# Patient Record
Sex: Male | Born: 1950
Health system: Southern US, Community
[De-identification: ages and names within clinical notes are randomized; demographics above are authoritative.]

## PROBLEM LIST (undated history)

## (undated) DIAGNOSIS — F329 Major depressive disorder, single episode, unspecified: Secondary | ICD-10-CM

## (undated) DIAGNOSIS — T8859XA Other complications of anesthesia, initial encounter: Secondary | ICD-10-CM

## (undated) DIAGNOSIS — J309 Allergic rhinitis, unspecified: Secondary | ICD-10-CM

## (undated) DIAGNOSIS — E785 Hyperlipidemia, unspecified: Secondary | ICD-10-CM

## (undated) DIAGNOSIS — K635 Polyp of colon: Secondary | ICD-10-CM

## (undated) DIAGNOSIS — F32A Depression, unspecified: Secondary | ICD-10-CM

## (undated) DIAGNOSIS — M1990 Unspecified osteoarthritis, unspecified site: Secondary | ICD-10-CM

## (undated) DIAGNOSIS — R569 Unspecified convulsions: Secondary | ICD-10-CM

## (undated) DIAGNOSIS — G5600 Carpal tunnel syndrome, unspecified upper limb: Secondary | ICD-10-CM

## (undated) DIAGNOSIS — F419 Anxiety disorder, unspecified: Secondary | ICD-10-CM

## (undated) DIAGNOSIS — I10 Essential (primary) hypertension: Secondary | ICD-10-CM

## (undated) DIAGNOSIS — K219 Gastro-esophageal reflux disease without esophagitis: Secondary | ICD-10-CM

## (undated) DIAGNOSIS — G4730 Sleep apnea, unspecified: Secondary | ICD-10-CM

## (undated) DIAGNOSIS — G4700 Insomnia, unspecified: Secondary | ICD-10-CM

## (undated) DIAGNOSIS — T7840XA Allergy, unspecified, initial encounter: Secondary | ICD-10-CM

## (undated) DIAGNOSIS — N529 Male erectile dysfunction, unspecified: Secondary | ICD-10-CM

## (undated) DIAGNOSIS — T4145XA Adverse effect of unspecified anesthetic, initial encounter: Secondary | ICD-10-CM

## (undated) HISTORY — DX: Essential (primary) hypertension: I10

## (undated) HISTORY — DX: Allergy, unspecified, initial encounter: T78.40XA

## (undated) HISTORY — PX: COLONOSCOPY: SHX174

## (undated) HISTORY — DX: Polyp of colon: K63.5

## (undated) HISTORY — DX: Hyperlipidemia, unspecified: E78.5

## (undated) HISTORY — DX: Carpal tunnel syndrome, unspecified upper limb: G56.00

## (undated) HISTORY — DX: Insomnia, unspecified: G47.00

## (undated) HISTORY — DX: Anxiety disorder, unspecified: F41.9

## (undated) HISTORY — DX: Allergic rhinitis, unspecified: J30.9

## (undated) HISTORY — DX: Depression, unspecified: F32.A

## (undated) HISTORY — DX: Sleep apnea, unspecified: G47.30

## (undated) HISTORY — DX: Gastro-esophageal reflux disease without esophagitis: K21.9

## (undated) HISTORY — PX: UPPER GASTROINTESTINAL ENDOSCOPY: SHX188

## (undated) HISTORY — DX: Major depressive disorder, single episode, unspecified: F32.9

## (undated) HISTORY — PX: TONSILLECTOMY: SUR1361

## (undated) HISTORY — DX: Unspecified osteoarthritis, unspecified site: M19.90

## (undated) HISTORY — DX: Unspecified convulsions: R56.9

## (undated) HISTORY — DX: Male erectile dysfunction, unspecified: N52.9

## (undated) HISTORY — PX: WISDOM TOOTH EXTRACTION: SHX21

---

## 2011-09-28 ENCOUNTER — Other Ambulatory Visit: Payer: Self-pay | Admitting: Internal Medicine

## 2011-09-28 DIAGNOSIS — N644 Mastodynia: Secondary | ICD-10-CM

## 2011-09-28 DIAGNOSIS — N63 Unspecified lump in unspecified breast: Secondary | ICD-10-CM

## 2011-10-04 ENCOUNTER — Ambulatory Visit
Admission: RE | Admit: 2011-10-04 | Discharge: 2011-10-04 | Disposition: A | Discharge status: Home / Self Care | Payer: Self-pay | Source: Ambulatory Visit | Attending: Internal Medicine | Admitting: Internal Medicine

## 2011-10-04 DIAGNOSIS — N63 Unspecified lump in unspecified breast: Secondary | ICD-10-CM

## 2011-10-04 DIAGNOSIS — N644 Mastodynia: Secondary | ICD-10-CM

## 2011-10-15 ENCOUNTER — Other Ambulatory Visit: Payer: Self-pay

## 2011-10-15 ENCOUNTER — Emergency Department (HOSPITAL_COMMUNITY)
Admission: EM | Admit: 2011-10-15 | Discharge: 2011-10-16 | Disposition: A | Discharge status: Home / Self Care | Payer: PRIVATE HEALTH INSURANCE | Attending: Emergency Medicine | Admitting: Emergency Medicine

## 2011-10-15 ENCOUNTER — Encounter (HOSPITAL_COMMUNITY): Payer: Self-pay | Admitting: Emergency Medicine

## 2011-10-15 DIAGNOSIS — R569 Unspecified convulsions: Secondary | ICD-10-CM | POA: Insufficient documentation

## 2011-10-15 DIAGNOSIS — R110 Nausea: Secondary | ICD-10-CM | POA: Insufficient documentation

## 2011-10-15 DIAGNOSIS — R4182 Altered mental status, unspecified: Secondary | ICD-10-CM | POA: Insufficient documentation

## 2011-10-15 DIAGNOSIS — R42 Dizziness and giddiness: Secondary | ICD-10-CM | POA: Insufficient documentation

## 2011-10-15 LAB — URINALYSIS, ROUTINE W REFLEX MICROSCOPIC
Bilirubin Urine: NEGATIVE
Glucose, UA: NEGATIVE mg/dL
Ketones, ur: NEGATIVE mg/dL
Leukocytes, UA: NEGATIVE
pH: 6 (ref 5.0–8.0)

## 2011-10-15 NOTE — ED Notes (Signed)
Pt states on March 1st, he had an episode where he becomes disoriented for a few seconds. Pt states he doesn't lose consciousness.  Pt states it happened a few times at the beginning of March so he called his PCP.  The episodes stopped for a few weeks but for the past 5 days he has had 1 episode a day.  Pt states he had 2 episodes today this am (last one at 1100), called his PCP and was told to come here.  Pt denies headache, chest pain, vision changes, syncope, or any other c/o now or during the episodes.

## 2011-10-16 ENCOUNTER — Emergency Department (HOSPITAL_COMMUNITY): Payer: PRIVATE HEALTH INSURANCE

## 2011-10-16 LAB — DIFFERENTIAL
Basophils Relative: 0 % (ref 0–1)
Lymphs Abs: 2.8 10*3/uL (ref 0.7–4.0)
Monocytes Absolute: 1.3 10*3/uL — ABNORMAL HIGH (ref 0.1–1.0)
Monocytes Relative: 14 % — ABNORMAL HIGH (ref 3–12)
Neutro Abs: 4.7 10*3/uL (ref 1.7–7.7)

## 2011-10-16 LAB — BASIC METABOLIC PANEL
BUN: 14 mg/dL (ref 6–23)
Chloride: 105 mEq/L (ref 96–112)
Creatinine, Ser: 1.13 mg/dL (ref 0.50–1.35)
GFR calc Af Amer: 79 mL/min — ABNORMAL LOW (ref >90)
Glucose, Bld: 101 mg/dL — ABNORMAL HIGH (ref 70–99)

## 2011-10-16 LAB — CBC
HCT: 42.9 % (ref 39.0–52.0)
Hemoglobin: 14.6 g/dL (ref 13.0–17.0)
MCH: 29.9 pg (ref 26.0–34.0)
MCHC: 34 g/dL (ref 30.0–36.0)

## 2011-10-16 NOTE — Discharge Instructions (Signed)
Nonepileptic Seizures  Nonepileptic seizures look like true epileptic seizures. The difference between nonepileptic seizures and real seizures is that real seizures are caused by an electrical abnormality in the brain. Nonepileptic seizures have no medical cause. Nonepileptic seizures may look real to an untrained person. A neurologist can usually tell the difference between a real seizure and a nonepileptic seizure. Nonepileptic seizures may also be called pseudoseizures. They are more frequent in women.  CAUSES   In general, the patient is unaware that the movements are not real seizures. This disorder is caused by stress or emotional trauma. Patients often feel badly. Patients are sometimes accused of causing the seizure-like movements when they are not aware that their symptoms are due to stress. The nonepileptic seizures are real and frightening to patients with this disorder. Sometimes, nonepileptic seizures may be due to a person faking the symptoms to get something he or she wants.  DIAGNOSIS   The diagnosis requires the patient to be continuously monitored by:   EEG (electroencephalogram).   Video camera.  After an episode, the patient is asked about their awareness, memory, and feelings during the seizure. The family, if present, also discusses what they see. The EEG and clinical information allows the neurologist to determine if the seizures are related to abnormal electrical activity in the brain.  TREATMENT   Medicines may be stopped if the patient has been treated for a true seizure disorder. Patient counseling is usually begun. Depression and anxiety, if present, are treated. Counseling helps to resolve stress.   Document Released: 08/13/2005 Document Revised: 06/17/2011 Document Reviewed: 01/09/2009  Exit[employer] Patient Information 2012 [employer].

## 2011-10-16 NOTE — ED Provider Notes (Addendum)
History     CSN: [id number]  Arrival date & time 10/15/11  1952   First MD Initiated Contact with Patient 10/15/11 2340      Chief Complaint  Patient presents with  . Altered Mental Status    (Consider location/radiation/quality/duration/timing/severity/associated sxs/prior treatment) HPI Comments: Patient has been having intermittent episodes for the last one month. He gives an intense smell and I thought that he can never remember. The episodes last for 20-30 seconds and then resolved. During these episodes he is conscious and alert. He denies any decreased vision, tongue biting, incontinence, weakness, loss of consciousness. They have become more frequent initially he was having them once a week and then they started happening every few days and for the last 5 days he's had one daily until today he had 2 episodes.  Patient is a 61 y.o. male presenting with altered mental status. The history is provided by the patient.  Altered Mental Status This is a recurrent problem. Episode onset: 1 month ago. The problem occurs daily. The problem has been gradually worsening. Pertinent negatives include no chest pain, no abdominal pain, no headaches and no shortness of breath. The symptoms are aggravated by nothing. The symptoms are relieved by nothing. He has tried nothing for the symptoms. The treatment provided no relief.    History reviewed. No pertinent past medical history.  History reviewed. No pertinent past surgical history.  History reviewed. No pertinent family history.  History  Substance Use Topics  . Smoking status: Never Smoker   . Smokeless tobacco: Not on file  . Alcohol Use: Yes     occasionally      Review of Systems  Constitutional: Negative for fever, appetite change and fatigue.  HENT: Negative for ear pain, congestion and rhinorrhea.   Respiratory: Negative for shortness of breath.   Cardiovascular: Negative for chest pain.  Gastrointestinal: Positive for  nausea. Negative for vomiting, abdominal pain and diarrhea.  Genitourinary: Negative for dysuria.  Neurological: Positive for dizziness. Negative for syncope, weakness, light-headedness and headaches.  Psychiatric/Behavioral: Positive for altered mental status.  All other systems reviewed and are negative.    Allergies  Review of patient's allergies indicates no known allergies.  Home Medications   Current Outpatient Rx  Name Route Sig Dispense Refill  . VITAMIN D 1000 UNITS PO TABS Oral Take 1,000 Units by mouth daily.    Marland Kitchen ESOMEPRAZOLE MAGNESIUM 40 MG PO CPDR Oral Take 40 mg by mouth daily before breakfast.    . OMEGA-3 FATTY ACIDS 1000 MG PO CAPS Oral Take 2 g by mouth daily.    Marland Kitchen METOPROLOL SUCCINATE ER 50 MG PO TB24 Oral Take 50 mg by mouth daily. Take with or immediately following a meal.    . ADULT MULTIVITAMIN W/MINERALS CH Oral Take 1 tablet by mouth daily.    Marland Kitchen OMEPRAZOLE 20 MG PO CPDR Oral Take 20 mg by mouth daily.      BP 128/68  Pulse 60  Temp(Src) 98.5 F (36.9 C) (Oral)  Resp 18  Wt 200 lb (90.719 kg)  SpO2 95%  Physical Exam  Nursing note and vitals reviewed. Constitutional: He is oriented to person, place, and time. He appears well-developed and well-nourished. No distress.  HENT:  Head: Normocephalic and atraumatic.  Mouth/Throat: Oropharynx is clear and moist.  Eyes: Conjunctivae and EOM are normal. Pupils are equal, round, and reactive to light.  Neck: Normal range of motion. Neck supple.  Cardiovascular: Normal rate, regular rhythm and intact distal pulses.  No murmur heard. Pulmonary/Chest: Effort normal and breath sounds normal. No respiratory distress. He has no wheezes. He has no rales.  Abdominal: Soft. He exhibits no distension. There is no tenderness. There is no rebound and no guarding.  Musculoskeletal: Normal range of motion. He exhibits no edema and no tenderness.  Neurological: He is alert and oriented to person, place, and time. He has  normal strength. No cranial nerve deficit or sensory deficit. He displays a negative Romberg sign. Coordination and gait normal. GCS eye subscore is 4. GCS verbal subscore is 5. GCS motor subscore is 6.  Skin: Skin is warm and dry. No rash noted. No erythema.  Psychiatric: He has a normal mood and affect. His behavior is normal.    ED Course  Procedures (including critical care time)  Labs Reviewed  DIFFERENTIAL - Abnormal; Notable for the following:    Monocytes Relative 14 (*)    Monocytes Absolute 1.3 (*)    All other components within normal limits  BASIC METABOLIC PANEL - Abnormal; Notable for the following:    Glucose, Bld 101 (*)    GFR calc non Af Amer 68 (*)    GFR calc Af Amer 79 (*)    All other components within normal limits  URINALYSIS, ROUTINE W REFLEX MICROSCOPIC  CBC   Ct Head Wo Contrast  10/16/2011  *RADIOLOGY REPORT*  Clinical Data: Altered mental status  CT HEAD WITHOUT CONTRAST  Technique:  Contiguous axial images were obtained from the base of the skull through the vertex without contrast.  Comparison: None.  Findings: No evidence of parenchymal hemorrhage or extra-axial fluid collection. No mass lesion, mass effect, or midline shift.  No CT evidence of acute infarction.  Cerebral volume is age appropriate.  No ventriculomegaly.  The visualized paranasal sinuses are essentially clear. The mastoid air cells are unopacified.  No evidence of calvarial fracture.  IMPRESSION: No evidence of acute intracranial abnormality.  Original Report Authenticated By: Charline Bills, M.D.    Date: 10/16/2011  Rate: 60  Rhythm: normal sinus rhythm  QRS Axis: normal  Intervals: normal  ST/T Wave abnormalities: normal  Conduction Disutrbances:none  Narrative Interpretation:   Old EKG Reviewed: none available    1. Seizure       MDM   Patient with episodes concerning for seizure. They have been ongoing now for over a month. Initially he was getting them sporadically and  now he's getting them daily. Today he had 2 episodes. They normally last no longer than 20-30 seconds. He states that he gets a very strong smell and I thought that he can never remember. After this occurred he feels mildly lightheaded and nauseated. He has no weakness, blurred vision, incontinence, tongue biting, loss of consciousness, convulsions. He otherwise states he's been in his normal state of health without any medication changes. Concern for new onset seizures. Patient does have a family history of his sister who has epilepsy. None of his symptoms are concerning for TIA or stroke. His neuro exam is normal. CBC, BMP, head CT, EKG are all within normal limits. Will speak with Dr. Felipa Eth and if patient followup to [facility] neurology.        Gwyneth Sprout, MD 10/16/11 5621  Gwyneth Sprout, MD 10/16/11 3086

## 2011-11-24 ENCOUNTER — Ambulatory Visit
Admission: RE | Admit: 2011-11-24 | Discharge: 2011-11-24 | Disposition: A | Discharge status: Home / Self Care | Payer: PRIVATE HEALTH INSURANCE | Source: Ambulatory Visit | Attending: Neurology | Admitting: Neurology

## 2011-11-24 ENCOUNTER — Other Ambulatory Visit: Payer: Self-pay | Admitting: Neurology

## 2011-11-24 DIAGNOSIS — IMO0002 Reserved for concepts with insufficient information to code with codable children: Secondary | ICD-10-CM

## 2012-04-14 ENCOUNTER — Other Ambulatory Visit (HOSPITAL_COMMUNITY): Payer: Self-pay | Admitting: Internal Medicine

## 2012-04-14 DIAGNOSIS — R1011 Right upper quadrant pain: Secondary | ICD-10-CM

## 2012-04-20 ENCOUNTER — Encounter (HOSPITAL_COMMUNITY)
Admission: RE | Admit: 2012-04-20 | Discharge: 2012-04-20 | Disposition: A | Discharge status: Home / Self Care | Payer: BC Managed Care – PPO | Source: Ambulatory Visit | Attending: Internal Medicine | Admitting: Internal Medicine

## 2012-04-20 DIAGNOSIS — R1011 Right upper quadrant pain: Secondary | ICD-10-CM

## 2012-04-20 MED ORDER — TECHNETIUM TC 99M MEBROFENIN IV KIT
5.4000 | PACK | Freq: Once | INTRAVENOUS | Status: AC | PRN
  Administered 2012-04-20: 5.4 via INTRAVENOUS

## 2012-10-18 ENCOUNTER — Other Ambulatory Visit: Payer: Self-pay | Admitting: Neurology

## 2013-02-17 ENCOUNTER — Other Ambulatory Visit: Payer: Self-pay | Admitting: Neurology

## 2013-03-17 ENCOUNTER — Other Ambulatory Visit: Payer: Self-pay | Admitting: Neurology

## 2013-04-03 ENCOUNTER — Encounter: Payer: Self-pay | Admitting: Nurse Practitioner

## 2013-04-03 ENCOUNTER — Other Ambulatory Visit: Payer: Self-pay | Admitting: Neurology

## 2013-04-03 ENCOUNTER — Ambulatory Visit (INDEPENDENT_AMBULATORY_CARE_PROVIDER_SITE_OTHER): Payer: BC Managed Care – PPO | Admitting: Nurse Practitioner

## 2013-04-03 VITALS — BP 124/72 | HR 72 | Ht 72.0 in | Wt 262.0 lb

## 2013-04-03 DIAGNOSIS — Z79899 Other long term (current) drug therapy: Secondary | ICD-10-CM

## 2013-04-03 DIAGNOSIS — G40109 Localization-related (focal) (partial) symptomatic epilepsy and epileptic syndromes with simple partial seizures, not intractable, without status epilepticus: Secondary | ICD-10-CM

## 2013-04-03 NOTE — Progress Notes (Signed)
[facility]  PATIENT: Samuel Jacobs DOB: 08-29-1950   REASON FOR VISIT: Followup for seizure disorder    HISTORY OF PRESENT ILLNESS: Samuel  Jacobs is a 62 years old right-handed Caucasian male, unaccompanied for visit, followup for seizure disorder. He has past medical history of mild hypertension.  His spells can be different, sometimes he smelled strong orders, lasting for couple minutes, no loss of consciousness, the other times is intrusion of strong emotional thoughts, sometimes pictures, but oftentimes  he has no recollection. He also has a history of staring , eye blinking, face turned white, confused afterwards, lasting for 2 minutes.  He presented to [facility] emergency, CAT scan of brain without contrast was normal. 12/24/2011:MRI of the brain with and without contrast, normal an EEG was normal, he had 2 recurrent episodes stereotypical, smell of ordor, followed by emotional intruding thoughts, mild confusion, lasting for a few minutes, no total loss of consciousness, no tonic-clonic activity, he also complains of insomnia Pt was switched to Depakote in June 2013 and  Keppra was tapered. Pt says he is doing much better on this drug. No further spells.   04/03/13: Patient returns for followup. He had elevated liver functions after his last visit with [place] July last year. His records indicate that he was to have an ultrasound of the gallbladder and liver however I do not have those results and  the patient is not sure if he had those tests. He remains on Depakote 1500 mg total dose daily. He says over the last year  he has had several episodes of intense strong odors not progressing to overt seizure activity.  REVIEW OF SYSTEMS: Full 14 system review of systems performed and notable only for:  Constitutional: Fatigue Cardiovascular: N/A  Ear/Nose/Throat: Ringing in the ears  Skin: N/A  Eyes: N/A  Respiratory: N/A  Gastroitestinal: N/A  Hematology/Lymphatic: N/A    Endocrine: N/A Musculoskeletal: Joint swelling, aching muscles Allergy/Immunology: N/A  Neurological:  Psychiatric: Depression anxiety  ALLERGIES: No Known Allergies  HOME MEDICATIONS: Outpatient Prescriptions Prior to Visit  Medication Sig Dispense Refill  . divalproex (DEPAKOTE ER) 500 MG 24 hr tablet TAKE 1 TABLET BY MOUTH IN THE MORNING THEN TAKE 2 TABLETS BY MOUTH AT BEDTIME  30 tablet  0  . esomeprazole (NEXIUM) 40 MG capsule Take 40 mg by mouth daily before breakfast.      . cholecalciferol (VITAMIN D) 1000 UNITS tablet Take 1,000 Units by mouth daily.      . fish oil-omega-3 fatty acids 1000 MG capsule Take 2 g by mouth daily.      . metoprolol succinate (TOPROL-XL) 50 MG 24 hr tablet Take 50 mg by mouth daily. Take with or immediately following a meal.      . Multiple Vitamin (MULITIVITAMIN WITH MINERALS) TABS Take 1 tablet by mouth daily.      Marland Kitchen omeprazole (PRILOSEC) 20 MG capsule Take 20 mg by mouth daily.       No facility-administered medications prior to visit.    PAST MEDICAL HISTORY: Past Medical History  Diagnosis Date  . Seizure   . Hypertension     PAST SURGICAL HISTORY: Past Surgical History  Procedure Laterality Date  . None      FAMILY HISTORY: History reviewed. No pertinent family history.  SOCIAL HISTORY: History   Social History  . Marital Status: Married    Spouse Name: Jeanie Sewer    Number of Children: 3  . Years of Education: college  Occupational History  . Not on file.   Social History Main Topics  . Smoking status: Never Smoker   . Smokeless tobacco: Never Used  . Alcohol Use: Yes     Comment: occasionally  . Drug Use: No  . Sexual Activity: Not on file   Other Topics Concern  . Not on file   Social History Narrative   Patient lives at home with wife Gavin Pound.    Patient works at [employer].    Patient has 3 children.      PHYSICAL EXAM  Filed Vitals:   04/03/13 1034  BP: 124/72  Pulse: 72  Height: 6' (1.829 m)   Weight: 262 lb (118.842 kg)   Body mass index is 35.53 kg/(m^2).  Generalized: Well developed, obese male in no acute distress  Head: normocephalic and atraumatic,. Oropharynx benign  Neck: Supple, no carotid bruits  Cardiac: Regular rate rhythm, no murmur   Neurological examination   Mentation: Alert oriented to time, place, history taking. Follows all commands speech and language fluent  Cranial nerve II-XII: .Pupils were equal round reactive to light extraocular movements were full, visual field were full on confrontational test. Facial sensation and strength were normal. hearing was intact to finger rubbing bilaterally. Uvula tongue midline. head turning and shoulder shrug and were normal and symmetric.Tongue protrusion into cheek strength was normal. Motor: normal bulk and tone, full strength in the BUE, BLE, fine finger movements normal, no pronator drift. No focal weakness Coordination: finger-nose-finger, heel-to-shin bilaterally, no dysmetria Reflexes: Brachioradialis 2/2, biceps 2/2, triceps 2/2, patellar 2/2, Achilles 2/2, plantar responses were flexor bilaterally. Gait and Station: Rising up from seated position without assistance, normal stance,  moderate stride, good arm swing, smooth turning, able to perform tiptoe, and heel walking without difficulty. Tandem gait steady  DIAGNOSTIC DATA (LABS, IMAGING, TESTING) -None to review    ASSESSMENT AND PLAN  62 y.o. year old male  has a past medical history of Seizure and Hypertension. here for followup. Patient to continue Depakote at the current dose will renew once labs are back or if liver functions remain elevated will switch to Lamictal. Get labs today, CBC CMP and valproic acid level Followup 6-8 months  Nilda Riggs, [facility], [facility], APRN  [facility] [address] [place], [place] [address] [phone]

## 2013-04-03 NOTE — Patient Instructions (Addendum)
Patient to continue Depakote at the current dose will renew once labs are back Get labs today Followup 6-8 months

## 2013-04-11 ENCOUNTER — Telehealth: Payer: Self-pay | Admitting: Nurse Practitioner

## 2013-04-11 NOTE — Telephone Encounter (Signed)
Please let pt know I have not received the labs ordered on his visit 04/03/13. Did he get them done elsewhere.

## 2013-04-16 NOTE — Telephone Encounter (Signed)
Called patient and left voicemail to return my call.

## 2013-05-02 ENCOUNTER — Telehealth: Payer: Self-pay | Admitting: Nurse Practitioner

## 2013-05-02 NOTE — Telephone Encounter (Signed)
I do not see that Samuel Jacobs ever had his labs drawn after his visit in September. This is important because of the medication Depakote that he is taking. Please ask patient if he had his labs done elsewhere. Thanks

## 2013-05-14 NOTE — Telephone Encounter (Signed)
Spoke to patient and he will come in tomorrow to get his labs drawn.

## 2013-05-16 ENCOUNTER — Other Ambulatory Visit (INDEPENDENT_AMBULATORY_CARE_PROVIDER_SITE_OTHER): Payer: Self-pay

## 2013-05-16 DIAGNOSIS — Z0289 Encounter for other administrative examinations: Secondary | ICD-10-CM

## 2013-05-17 LAB — CBC WITH DIFFERENTIAL/PLATELET
Basophils Absolute: 0.1 10*3/uL (ref 0.0–0.2)
Eosinophils Absolute: 0.3 10*3/uL (ref 0.0–0.4)
HCT: 44.1 % (ref 37.5–51.0)
Lymphocytes Absolute: 3.9 10*3/uL — ABNORMAL HIGH (ref 0.7–3.1)
MCH: 31.4 pg (ref 26.6–33.0)
MCHC: 34 g/dL (ref 31.5–35.7)
MCV: 93 fL (ref 79–97)
Monocytes Absolute: 1.3 10*3/uL — ABNORMAL HIGH (ref 0.1–0.9)
Neutrophils Absolute: 2.1 10*3/uL (ref 1.4–7.0)
RDW: 13.6 % (ref 12.3–15.4)

## 2013-05-17 LAB — COMPREHENSIVE METABOLIC PANEL
ALT: 47 IU/L — ABNORMAL HIGH (ref 0–44)
AST: 36 IU/L (ref 0–40)
Calcium: 9.1 mg/dL (ref 8.6–10.2)
Chloride: 102 mmol/L (ref 96–108)
Glucose: 104 mg/dL — ABNORMAL HIGH (ref 65–99)
Potassium: 4.8 mmol/L (ref 3.5–5.2)
Sodium: 137 mmol/L (ref 134–144)
Total Protein: 7 g/dL (ref 6.0–8.5)

## 2013-05-17 LAB — VALPROIC ACID LEVEL: Valproic Acid Lvl: 57 ug/mL (ref 50–100)

## 2013-07-23 ENCOUNTER — Telehealth: Payer: Self-pay | Admitting: Neurology

## 2013-07-23 NOTE — Telephone Encounter (Signed)
Patient states he needs sooner appointment than the one scheduled 10/02/13 since he has had 3 seizures since his last visit in September. Please call patient to schedule.

## 2013-07-24 NOTE — Telephone Encounter (Signed)
Dana:  Please give him appointment with Hoyle Sauer or me in one to 2 weeks

## 2013-10-02 ENCOUNTER — Encounter: Payer: Self-pay | Admitting: Nurse Practitioner

## 2013-10-02 ENCOUNTER — Ambulatory Visit: Payer: BC Managed Care – PPO | Admitting: Nurse Practitioner

## 2013-10-02 ENCOUNTER — Encounter (INDEPENDENT_AMBULATORY_CARE_PROVIDER_SITE_OTHER): Payer: Self-pay

## 2013-10-02 ENCOUNTER — Ambulatory Visit (INDEPENDENT_AMBULATORY_CARE_PROVIDER_SITE_OTHER): Payer: BC Managed Care – PPO | Admitting: Nurse Practitioner

## 2013-10-02 VITALS — BP 119/75 | HR 65 | Ht 72.0 in | Wt 256.5 lb

## 2013-10-02 DIAGNOSIS — Z79899 Other long term (current) drug therapy: Secondary | ICD-10-CM

## 2013-10-02 DIAGNOSIS — G40109 Localization-related (focal) (partial) symptomatic epilepsy and epileptic syndromes with simple partial seizures, not intractable, without status epilepticus: Secondary | ICD-10-CM

## 2013-10-02 MED ORDER — DIVALPROEX SODIUM ER 500 MG PO TB24: 1000.0000 mg | ORAL_TABLET | Freq: Two times a day (BID) | ORAL | Status: DC

## 2013-10-02 NOTE — Progress Notes (Signed)
[facility]  PATIENT: Samuel Jacobs DOB: 1950/07/27   REASON FOR VISIT: Followup for seizure disorder   HISTORY OF PRESENT ILLNESS: Samuel Jacobs, 63 year old male returns for followup. He was last seen in our office 04/03/2013. He has a history of seizure events where he smelled strong odors lasting for a couple of minutes no loss of consciousness, sometimes he will have intrusions of strong emotion and sometimes he has no recollection. He has a history of staring eye blinking lasting for about 2 minutes with confusion afterwards. He has had elevated liver functions in the past but his liver functions were within normal limits. When last checked in November 2014 his VPA level was 57 at that time. On return visit today he says he has had 3 additional episodes of smelling strong odors lasting for a couple of minutes with no loss of consciousness  HISTORY:  seizure disorder. He has past medical history of mild hypertension.  His spells can be different, sometimes he smelled strong orders, lasting for couple minutes, no loss of consciousness, the other times is intrusion of strong emotional thoughts, sometimes pictures, but oftentimes he has no recollection.  He also has a history of staring , eye blinking, face turned white, confused afterwards, lasting for 2 minutes.  He presented to [facility] emergency, CAT scan of brain without contrast was normal.  12/24/2011:MRI of the brain with and without contrast, normal an EEG was normal, he had 2 recurrent episodes stereotypical, smell of ordor, followed by emotional intruding thoughts, mild confusion, lasting for a few minutes, no total loss of consciousness, no tonic-clonic activity, he also complains of insomnia  Pt was switched to Depakote in June 2013 and Keppra was tapered.  Pt says he is doing much better on this drug. No further spells.  04/03/13: Patient returns for followup. He had elevated liver functions after his last visit with  [place] July last year. His records indicate that he was to have an ultrasound of the gallbladder and liver however I do not have those results and the patient is not sure if he had those tests. He remains on Depakote 1500 mg total dose daily. He says over the last year he has had several episodes of intense strong odors not progressing to overt seizure activity.  REVIEW OF SYSTEMS: Full 14 system review of systems performed and notable only for those listed, all others are neg:  Constitutional: N/A  Cardiovascular: N/A  Ear/Nose/Throat: N/A  Skin: N/A  Eyes: N/A  Respiratory: N/A  Gastroitestinal: N/A  Hematology/Lymphatic: N/A  Endocrine: N/A Musculoskeletal:N/A  Allergy/Immunology: N/A  Neurological: N/A Psychiatric: N/A   ALLERGIES: No Known Allergies  HOME MEDICATIONS: Outpatient Prescriptions Prior to Visit  Medication Sig Dispense Refill  . esomeprazole (NEXIUM) 40 MG capsule Take 40 mg by mouth daily before breakfast.      . Multiple Vitamins-Minerals (MULTIVITAMIN PO) Take by mouth daily.      . divalproex (DEPAKOTE ER) 500 MG 24 hr tablet TAKE 1 TABLET BY MOUTH IN THE MORNING THEN TAKE 2 TABLETS BY MOUTH AT BEDTIME  90 tablet  6   No facility-administered medications prior to visit.    PAST MEDICAL HISTORY: Past Medical History  Diagnosis Date  . Seizure   . Hypertension     PAST SURGICAL HISTORY: Past Surgical History  Procedure Laterality Date  . None      FAMILY HISTORY: History reviewed. No pertinent family history.  SOCIAL HISTORY: History   Social History  .  Marital Status: Married    Spouse Name: Gailen Shelter    Number of Children: 3  . Years of Education: college   Occupational History  . Not on file.   Social History Main Topics  . Smoking status: Never Smoker   . Smokeless tobacco: Never Used  . Alcohol Use: Yes     Comment: occasionally  . Drug Use: No  . Sexual Activity: Not on file   Other Topics Concern  . Not on file   Social  History Narrative   Patient lives at home with wife Neoma Laming.    Patient works at [employer]    Patient has 3 children.      PHYSICAL EXAM  Filed Vitals:   10/02/13 1106  BP: 119/75  Pulse: 65  Height: 6' (1.829 m)  Weight: 256 lb 8 oz (116.348 kg)   Body mass index is 34.78 kg/(m^2).  Generalized: Well developed, obese male in no acute distress  Head: normocephalic and atraumatic,. Oropharynx benign  Neck: Supple, no carotid bruits  Cardiac: Regular rate rhythm, no murmur  Musculoskeletal: No deformity   Neurological examination   Mentation: Alert oriented to time, place, history taking. Follows all commands speech and language fluent  Cranial nerve II-XII: Pupils were equal round reactive to light extraocular movements were full, visual field were full on confrontational test. Facial sensation and strength were normal. hearing was intact to finger rubbing bilaterally. Uvula tongue midline. head turning and shoulder shrug were normal and symmetric.Tongue protrusion into cheek strength was normal. Motor: normal bulk and tone, full strength in the BUE, BLE,  No focal weakness Coordination: finger-nose-finger, heel-to-shin bilaterally, no dysmetria Reflexes: Brachioradialis 2/2, biceps 2/2, triceps 2/2, patellar 2/2, Achilles 2/2, plantar responses were flexor bilaterally. Gait and Station: Rising up from seated position without assistance, normal stance,  moderate stride, good arm swing, smooth turning, able to perform tiptoe, and heel walking without difficulty. Tandem gait is steady  DIAGNOSTIC DATA (LABS, IMAGING, TESTING) - I reviewed patient records, labs, notes, testing and imaging myself where available.      Component Value Date/Time   NA 137 05/16/2013 1012   NA 140 10/16/2011 0040   K 4.8 05/16/2013 1012   CL 102 05/16/2013 1012   CO2 27 05/16/2013 1012   GLUCOSE 104* 05/16/2013 1012   GLUCOSE 101* 10/16/2011 0040   BUN 17 05/16/2013 1012   BUN 14 10/16/2011 0040    CREATININE 1.06 05/16/2013 1012   CALCIUM 9.1 05/16/2013 1012   PROT 7.0 05/16/2013 1012   AST 36 05/16/2013 1012   ALT 47* 05/16/2013 1012   ALKPHOS 55 05/16/2013 1012   BILITOT 0.4 05/16/2013 1012   GFRNONAA 75 05/16/2013 1012   GFRAA 87 05/16/2013 1012       ASSESSMENT AND PLAN  63 y.o. year old male  has a past medical history of Seizure and Hypertension. here to followup. He claims she has had 3 additional episodes of smelling strong odors without loss of consciousness since last seen. Valproic acid level 05/17/2013 was 57  Increase Depakote to 2 tabs twice daily. Will refill Get level of drug in 14 days, come to our office on or around April 7th.  F/U in 6 months Dennie Bible, [facility], [facility], APRN  [facility] [address] [place],  [address] [phone]

## 2013-10-02 NOTE — Patient Instructions (Signed)
Increase Depakote to 2 tabs twice daily. Will refill Get level of drug in 14 days, come to our office F/U in 6 months

## 2013-10-24 ENCOUNTER — Telehealth: Payer: Self-pay | Admitting: Nurse Practitioner

## 2013-10-24 DIAGNOSIS — Z5181 Encounter for therapeutic drug level monitoring: Secondary | ICD-10-CM

## 2013-10-24 NOTE — Telephone Encounter (Signed)
Called pt to inform him that Hoyle Sauer, NP has already put the lab work orders in and that he could stop by in the morning before he takes his medication, to get blood work done. I advised the pt that if he has any other problems, questions or concerns to call the office. Pt verbalized understanding.

## 2013-10-24 NOTE — Telephone Encounter (Signed)
Patient says he needs an order for lab--he has taken Depakote for 2 weeks and was supposed to have bloodwork done after that--please call.

## 2013-10-24 NOTE — Telephone Encounter (Signed)
Orders are in the system. Patient needs trough level drawn

## 2013-10-24 NOTE — Telephone Encounter (Signed)
Pt calling requesting that lab orders be put in to check his Depakote. Per OV note on 09/2413 pt was instructed to come back in 14 days to get blood work. Please advise

## 2013-10-26 ENCOUNTER — Other Ambulatory Visit: Payer: Self-pay | Admitting: Nurse Practitioner

## 2013-10-26 ENCOUNTER — Other Ambulatory Visit (INDEPENDENT_AMBULATORY_CARE_PROVIDER_SITE_OTHER): Payer: Self-pay

## 2013-10-26 DIAGNOSIS — Z5181 Encounter for therapeutic drug level monitoring: Secondary | ICD-10-CM

## 2013-10-26 DIAGNOSIS — Z0289 Encounter for other administrative examinations: Secondary | ICD-10-CM

## 2013-10-26 LAB — VALPROIC ACID LEVEL: VALPROIC ACID LVL: 68 ug/mL (ref 50–100)

## 2013-11-02 ENCOUNTER — Other Ambulatory Visit: Payer: Self-pay | Admitting: Neurology

## 2013-12-02 ENCOUNTER — Other Ambulatory Visit: Payer: Self-pay | Admitting: Neurology

## 2014-02-14 NOTE — Telephone Encounter (Signed)
Noted  

## 2014-03-14 ENCOUNTER — Ambulatory Visit (INDEPENDENT_AMBULATORY_CARE_PROVIDER_SITE_OTHER): Payer: BC Managed Care – PPO | Admitting: Adult Health

## 2014-03-14 ENCOUNTER — Telehealth: Payer: Self-pay | Admitting: Neurology

## 2014-03-14 ENCOUNTER — Encounter: Payer: Self-pay | Admitting: Adult Health

## 2014-03-14 VITALS — BP 135/73 | HR 76 | Ht 71.0 in | Wt 257.0 lb

## 2014-03-14 DIAGNOSIS — Z5181 Encounter for therapeutic drug level monitoring: Secondary | ICD-10-CM

## 2014-03-14 DIAGNOSIS — G40109 Localization-related (focal) (partial) symptomatic epilepsy and epileptic syndromes with simple partial seizures, not intractable, without status epilepticus: Secondary | ICD-10-CM

## 2014-03-14 NOTE — Progress Notes (Signed)
PATIENT: Caellum Mancil DOB: 21-Oct-1950  REASON FOR VISIT: follow up HISTORY FROM: patient  HISTORY OF PRESENT ILLNESS: Mr. Minehart is a 63 year old male with a history of seizures. He returns today stating that he has had three seizures. His seizures consist of staring off but it starts with an odor that he smells. He states before this he may have had 1 seizure a month and sometimes the smell will only occur.  He is currently taking Depakote 500 mg 2 tablets BID. He confirms that he has been getting adequate sleep. He states that he sleeps 4-5 hours at night but it has been this way for the last year. Denies any recent sickness or infection. Denies any changes in his Depakote pills. He has good compliance with his medication. Denies any new neurological compliant since last seen. He does state that for the last month is has what he describes as a "rotating light" that he sees in his peripheral vision on the right side. He states that he only sees it out of his right eye. Denies any headache associated with the rotating light. He only sees the light at night.   REVIEW OF SYSTEMS: Full 14 system review of systems performed and notable only for:  Constitutional: N/A  Eyes: Blurred vision Ear/Nose/Throat: N/A  Skin: N/A  Cardiovascular: N/A  Respiratory: N/A  Gastrointestinal: N/A  Genitourinary: N/A Hematology/Lymphatic: N/A  Endocrine: N/A Musculoskeletal:N/A  Allergy/Immunology: N/A  Neurological: Dizziness, seizure, weakness Psychiatric: Nervous Sleep: Daytime sleepiness   ALLERGIES: No Known Allergies  HOME MEDICATIONS: Outpatient Prescriptions Prior to Visit  Medication Sig Dispense Refill  . divalproex (DEPAKOTE ER) 500 MG 24 hr tablet Take 2 tablets (1,000 mg total) by mouth 2 (two) times daily.  120 tablet  6  . divalproex (DEPAKOTE ER) 500 MG 24 hr tablet Take 2 tablets (1,000 mg total) by mouth 2 (two) times daily.  120 tablet  6  . divalproex (DEPAKOTE ER) 500 MG 24 hr  tablet Take 2 tablets (1,000 mg total) by mouth 2 (two) times daily.  120 tablet  6  . esomeprazole (NEXIUM) 40 MG capsule Take 40 mg by mouth daily before breakfast.      . Multiple Vitamins-Minerals (MULTIVITAMIN PO) Take by mouth daily.       No facility-administered medications prior to visit.    PAST MEDICAL HISTORY: Past Medical History  Diagnosis Date  . Seizure   . Hypertension     PAST SURGICAL HISTORY: Past Surgical History  Procedure Laterality Date  . None      FAMILY HISTORY: History reviewed. No pertinent family history.  SOCIAL HISTORY: History   Social History  . Marital Status: Married    Spouse Name: Gailen Shelter    Number of Children: 3  . Years of Education: college   Occupational History  . Not on file.   Social History Main Topics  . Smoking status: Never Smoker   . Smokeless tobacco: Never Used  . Alcohol Use: No  . Drug Use: No  . Sexual Activity: Not on file   Other Topics Concern  . Not on file   Social History Narrative   Patient lives at home with wife Neoma Laming.    Patient works at [employer].    Patient has 3 children.    Patient has a college education.   Patient is right handed.      PHYSICAL EXAM  Filed Vitals:   03/14/14 1427  BP: 135/73  Pulse: 76  Height: 5\' 11"  (1.803 m)  Weight: 257 lb (116.574 kg)   Body mass index is 35.86 kg/(m^2).  Generalized: Well developed, in no acute distress   Neurological examination  Mentation: Alert oriented to time, place, history taking. Follows all commands speech and language fluent Cranial nerve II-XII: Pupils were equal round reactive to light. Extraocular movements were full, visual field were full on confrontational test. Facial sensation and strength were normal. uvula tongue midline. Head turning and shoulder shrug  were normal and symmetric. Motor: The motor testing reveals 5 over 5 strength of all 4 extremities. Good symmetric motor tone is noted throughout.  Sensory:  Sensory testing is intact to soft touch on all 4 extremities. No evidence of extinction is noted.  Coordination: Cerebellar testing reveals good finger-nose-finger and heel-to-shin bilaterally.  Gait and station: Gait is normal. Tandem gait is normal. Romberg is negative. No drift is seen.  Reflexes: Deep tendon reflexes are symmetric and normal bilaterally.   DIAGNOSTIC DATA (LABS, IMAGING, TESTING) - I reviewed patient records, labs, notes, testing and imaging myself where available.  Lab Results  Component Value Date   WBC 7.7 05/16/2013   HGB 15.0 05/16/2013   HCT 44.1 05/16/2013   MCV 93 05/16/2013   PLT 294 10/16/2011      Component Value Date/Time   NA 137 05/16/2013 1012   NA 140 10/16/2011 0040   K 4.8 05/16/2013 1012   CL 102 05/16/2013 1012   CO2 27 05/16/2013 1012   GLUCOSE 104* 05/16/2013 1012   GLUCOSE 101* 10/16/2011 0040   BUN 17 05/16/2013 1012   BUN 14 10/16/2011 0040   CREATININE 1.06 05/16/2013 1012   CALCIUM 9.1 05/16/2013 1012   PROT 7.0 05/16/2013 1012   AST 36 05/16/2013 1012   ALT 47* 05/16/2013 1012   ALKPHOS 55 05/16/2013 1012   BILITOT 0.4 05/16/2013 1012   GFRNONAA 75 05/16/2013 1012   GFRAA 87 05/16/2013 1012   ASSESSMENT AND PLAN 63 y.o. year old male  has a past medical history of Seizure and Hypertension. here with:  1. Seizures  He currently takes Depakote 500 mg 2 tablets BID. I will check Blood work today. His Depakote levels will determine if we increase the Depakote or start a new medication. I will call the patient with his results and at that time we will discuss his plan of care going forward. I have advised the patient to follow-up with his ophthalmologist regarding the rotating light he is seeing at night. Patient should follow-up with [place]  in 4 months or sooner if needed.   Ward Givens, MSN, NP-C 03/14/2014, 2:44 PM [facility] [address] [place], [place] [address] [phone]  Note: This document was prepared with  digital dictation and possible smart phrase technology. Any transcriptional errors that result from this process are unintentional.

## 2014-03-14 NOTE — Telephone Encounter (Signed)
I spoke to pt and he has had 3 seizures this am (0400, [id number]).  Not sure of trigger.(similar presentation).  No new meds.   Is not sleep deprived, no illness , has been compliant with meds.  Appt request today.

## 2014-03-14 NOTE — Telephone Encounter (Signed)
I called pt and made appt with MM/NP for 1430 today.  (be here 1415).  LMVM for wife on her cell about appt today at 1430.

## 2014-03-14 NOTE — Telephone Encounter (Signed)
Looks like he did not have repeat level after last dose change. He can see Jinny Blossom or the work in I do not have any openings.

## 2014-03-14 NOTE — Patient Instructions (Signed)

## 2014-03-14 NOTE — Telephone Encounter (Signed)
Patient calling to state that he has had 3 seizures today and wants to come in for an appointment today. Please call and advise.

## 2014-03-15 ENCOUNTER — Telehealth: Payer: Self-pay | Admitting: Adult Health

## 2014-03-15 LAB — CBC WITH DIFFERENTIAL
BASOS: 1 %
Basophils Absolute: 0 10*3/uL (ref 0.0–0.2)
EOS: 2 %
Eosinophils Absolute: 0.1 10*3/uL (ref 0.0–0.4)
HCT: 43.4 % (ref 37.5–51.0)
HEMOGLOBIN: 15.1 g/dL (ref 12.6–17.7)
IMMATURE GRANS (ABS): 0 10*3/uL (ref 0.0–0.1)
Immature Granulocytes: 0 %
Lymphocytes Absolute: 3.1 10*3/uL (ref 0.7–3.1)
Lymphs: 37 %
MCH: 31.4 pg (ref 26.6–33.0)
MCHC: 34.8 g/dL (ref 31.5–35.7)
MCV: 90 fL (ref 79–97)
MONOS ABS: 1.1 10*3/uL — AB (ref 0.1–0.9)
Monocytes: 13 %
NEUTROS PCT: 47 %
Neutrophils Absolute: 3.9 10*3/uL (ref 1.4–7.0)
Platelets: 273 10*3/uL (ref 150–379)
RBC: 4.81 x10E6/uL (ref 4.14–5.80)
RDW: 14.2 % (ref 12.3–15.4)
WBC: 8.3 10*3/uL (ref 3.4–10.8)

## 2014-03-15 LAB — AMMONIA: Ammonia: 78 ug/dL (ref 27–102)

## 2014-03-15 LAB — COMPREHENSIVE METABOLIC PANEL
A/G RATIO: 1.4 (ref 1.1–2.5)
ALBUMIN: 4.1 g/dL (ref 3.6–4.8)
ALT: 79 IU/L — ABNORMAL HIGH (ref 0–44)
AST: 64 IU/L — ABNORMAL HIGH (ref 0–40)
Alkaline Phosphatase: 60 IU/L (ref 39–117)
BUN/Creatinine Ratio: 18 (ref 10–22)
BUN: 18 mg/dL (ref 8–27)
CALCIUM: 9.2 mg/dL (ref 8.6–10.2)
CO2: 25 mmol/L (ref 18–29)
CREATININE: 1 mg/dL (ref 0.76–1.27)
Chloride: 98 mmol/L (ref 97–108)
GFR calc Af Amer: 92 mL/min/{1.73_m2} (ref >59)
GFR, EST NON AFRICAN AMERICAN: 80 mL/min/{1.73_m2} (ref >59)
GLOBULIN, TOTAL: 3 g/dL (ref 1.5–4.5)
GLUCOSE: 112 mg/dL — AB (ref 65–99)
Potassium: 4.4 mmol/L (ref 3.5–5.2)
SODIUM: 140 mmol/L (ref 134–144)
TOTAL PROTEIN: 7.1 g/dL (ref 6.0–8.5)
Total Bilirubin: 0.5 mg/dL (ref 0.0–1.2)

## 2014-03-15 LAB — VALPROIC ACID LEVEL: Valproic Acid Lvl: 57 ug/mL (ref 50–100)

## 2014-03-15 MED ORDER — DIVALPROEX SODIUM ER 500 MG PO TB24: ORAL_TABLET | ORAL | Status: DC

## 2014-03-15 NOTE — Telephone Encounter (Signed)
I called the patient. His Depakote was in the low normal range. I will increase his Depakote to 2 tablets in the morning and 3 tablets in the evening. He will return in 6 weeks to have his Depakote levels checked. Patient verbalized understanding.

## 2014-03-27 ENCOUNTER — Telehealth: Payer: Self-pay | Admitting: Adult Health

## 2014-03-27 MED ORDER — DIVALPROEX SODIUM ER 500 MG PO TB24: ORAL_TABLET | ORAL | Status: DC

## 2014-03-27 NOTE — Telephone Encounter (Signed)
The patient's dose was recently changed.  It appears the pharmacy filled the old Rx.  I have resent Rx with current directions per phone note on 09/04.  I called the patient back, got no answer.  Left message.

## 2014-03-27 NOTE — Telephone Encounter (Signed)
Patient stated Rx for Depakote picked up today reads 2 tabs in am and 2 tabs in pm.  Should read 2 tabs in am and 3 tabs in pm.  Rx shows that in system, but patient states what he picked up doesn't show those instructions.  Please call and advise.

## 2014-04-04 ENCOUNTER — Ambulatory Visit: Payer: BC Managed Care – PPO | Admitting: Nurse Practitioner

## 2014-04-22 ENCOUNTER — Telehealth: Payer: Self-pay | Admitting: Adult Health

## 2014-04-22 DIAGNOSIS — Z5181 Encounter for therapeutic drug level monitoring: Secondary | ICD-10-CM

## 2014-04-22 NOTE — Telephone Encounter (Signed)
I called the patient and informed him that he could come in this week or next to have his Depakote level rechecked.

## 2014-05-02 ENCOUNTER — Other Ambulatory Visit (INDEPENDENT_AMBULATORY_CARE_PROVIDER_SITE_OTHER): Payer: Self-pay

## 2014-05-02 DIAGNOSIS — Z5181 Encounter for therapeutic drug level monitoring: Secondary | ICD-10-CM

## 2014-05-02 DIAGNOSIS — Z0289 Encounter for other administrative examinations: Secondary | ICD-10-CM

## 2014-05-03 ENCOUNTER — Telehealth: Payer: Self-pay | Admitting: Adult Health

## 2014-05-03 LAB — COMPREHENSIVE METABOLIC PANEL
A/G RATIO: 1.5 (ref 1.1–2.5)
ALBUMIN: 4.1 g/dL (ref 3.6–4.8)
ALT: 106 IU/L — ABNORMAL HIGH (ref 0–44)
AST: 106 IU/L — ABNORMAL HIGH (ref 0–40)
Alkaline Phosphatase: 63 IU/L (ref 39–117)
BILIRUBIN TOTAL: 0.4 mg/dL (ref 0.0–1.2)
BUN/Creatinine Ratio: 17 (ref 10–22)
BUN: 16 mg/dL (ref 8–27)
CO2: 25 mmol/L (ref 18–29)
CREATININE: 0.94 mg/dL (ref 0.76–1.27)
Calcium: 9.5 mg/dL (ref 8.6–10.2)
Chloride: 99 mmol/L (ref 96–108)
GFR, EST AFRICAN AMERICAN: 99 mL/min/{1.73_m2} (ref >59)
GFR, EST NON AFRICAN AMERICAN: 86 mL/min/{1.73_m2} (ref >59)
GLUCOSE: 169 mg/dL — AB (ref 65–99)
Globulin, Total: 2.7 g/dL (ref 1.5–4.5)
Potassium: 4.6 mmol/L (ref 3.5–5.2)
Sodium: 134 mmol/L (ref 134–144)
TOTAL PROTEIN: 6.8 g/dL (ref 6.0–8.5)

## 2014-05-03 LAB — VALPROIC ACID LEVEL: Valproic Acid Lvl: 54 ug/mL (ref 50–100)

## 2014-05-03 MED ORDER — DIVALPROEX SODIUM ER 500 MG PO TB24: ORAL_TABLET | ORAL | Status: DC

## 2014-05-03 MED ORDER — LACOSAMIDE 50 MG PO TABS: 50.0000 mg | ORAL_TABLET | Freq: Two times a day (BID) | ORAL | Status: DC

## 2014-05-03 NOTE — Telephone Encounter (Signed)
I called the patient. His Depakote level is slightly decreased even though we increase his Depakote dosage. He confirms that he has been taking two tablets in the AM and three in the PM. His liver enzymes  are also elevated. He reports that he is still having about 2 seizures a month. He states that his seizures consist of the staring spells. I will add Vimpat 50 mg BID. I will decrease the Depakote to two tablets BID. If Vimpat is tolerated and beneficial we will increase the dose until it is therapeutic and wean him off the Depakote.

## 2014-05-20 ENCOUNTER — Telehealth: Payer: Self-pay | Admitting: Neurology

## 2014-05-20 NOTE — Telephone Encounter (Signed)
Patient's wife Samuel Jacobs calling to ask what was tested when patient last had labwork, please return call and advise.

## 2014-05-21 NOTE — Telephone Encounter (Signed)
Called patient about his labs I will mail his labs to him.

## 2014-05-29 ENCOUNTER — Telehealth: Payer: Self-pay | Admitting: Adult Health

## 2014-05-29 NOTE — Telephone Encounter (Signed)
Patient is calling to find out when he should have lab work since he is now taking Vimpat. Please call and advise. It is ok to leave a message. Thank you.

## 2014-05-31 MED ORDER — DIVALPROEX SODIUM ER 500 MG PO TB24: ORAL_TABLET | ORAL | Status: DC

## 2014-05-31 MED ORDER — LACOSAMIDE 100 MG PO TABS: 100.0000 mg | ORAL_TABLET | Freq: Two times a day (BID) | ORAL | Status: DC

## 2014-05-31 NOTE — Telephone Encounter (Signed)
I called the patient. He denies having any seizures since starting the Vimpat. I will increase the Vimpat 100 mg twice a day and decrease the Depakote to 1 tablet in the morning and 2 tablets in the evening. If the patient continues to remain seizure free we will continue to wean the Depakote. At this time the patient does not need additional blood work.

## 2014-07-15 ENCOUNTER — Ambulatory Visit: Payer: BC Managed Care – PPO | Admitting: Adult Health

## 2014-07-15 DIAGNOSIS — Z0289 Encounter for other administrative examinations: Secondary | ICD-10-CM

## 2014-07-30 ENCOUNTER — Encounter: Payer: Self-pay | Admitting: Adult Health

## 2014-07-30 ENCOUNTER — Ambulatory Visit (INDEPENDENT_AMBULATORY_CARE_PROVIDER_SITE_OTHER): Payer: BLUE CROSS/BLUE SHIELD | Admitting: Adult Health

## 2014-07-30 VITALS — BP 130/79 | HR 64 | Ht 71.0 in | Wt 252.0 lb

## 2014-07-30 DIAGNOSIS — G40109 Localization-related (focal) (partial) symptomatic epilepsy and epileptic syndromes with simple partial seizures, not intractable, without status epilepticus: Secondary | ICD-10-CM

## 2014-07-30 MED ORDER — DIVALPROEX SODIUM ER 500 MG PO TB24: ORAL_TABLET | ORAL | Status: DC

## 2014-07-30 NOTE — Progress Notes (Signed)
I agree above plan. 

## 2014-07-30 NOTE — Patient Instructions (Signed)
Continue taking Vimpat 100 mg twice a day.  Decrease Depakote 500 mg to 1 tablet in the AM and 1 tablet in the PM If you have any additional seizures please let us know.

## 2014-07-30 NOTE — Progress Notes (Signed)
PATIENT: Samuel Jacobs DOB: 02/15/1951  REASON FOR VISIT: follow up- seizures HISTORY FROM: patient  HISTORY OF PRESENT ILLNESS: Samuel Jacobs is a 64 year old male with a history of seizures. He returns today for follow-up. He is currently taking Depakote 1 tablet in the morning and 2 tablets in the evening as well as vimpat 100 mg BID. He reports that he has not had any additional seizures. He does not operate a motor vehicle. He is able to complete all ADLs independently.  Patient states that at night when he lays down and gets relaxed he will have jerking movements in his arms and legs. He states that this does not bother him and he is able to fall asleep. He states that he sleeps well at night. Overall he feels that he is doing well since starting Vimpat.   HISTORY 03/14/14: Samuel Jacobs is a 64 year old male with a history of seizures. He returns today stating that he has had three seizures. His seizures consist of staring off but it starts with an odor that he smells. He states before this he may have had 1 seizure a month and sometimes the smell will only occur.  He is currently taking Depakote 500 mg 2 tablets BID. He confirms that he has been getting adequate sleep. He states that he sleeps 4-5 hours at night but it has been this way for the last year. Denies any recent sickness or infection. Denies any changes in his Depakote pills. He has good compliance with his medication. Denies any new neurological compliant since last seen. He does state that for the last month is has what he describes as a "rotating light" that he sees in his peripheral vision on the right side. He states that he only sees it out of his right eye. Denies any headache associated with the rotating light. He only sees the light at night  REVIEW OF SYSTEMS: Out of a complete 14 system review of symptoms, the patient complains only of the following symptoms, and all other reviewed systems are negative.  Daytime sleepiness,  tremors  ALLERGIES: No Known Allergies  HOME MEDICATIONS: Outpatient Prescriptions Prior to Visit  Medication Sig Dispense Refill  . b complex vitamins tablet Take 1 tablet by mouth daily.    . divalproex (DEPAKOTE ER) 500 MG 24 hr tablet Take one tablet PO in the morning and two tablets PO at night. 90 tablet 6  . Lacosamide 100 MG TABS Take 1 tablet (100 mg total) by mouth 2 (two) times daily. (Patient not taking: Reported on 07/30/2014) 30 tablet 3  . pantoprazole (PROTONIX) 40 MG tablet Take 40 mg by mouth daily.     No facility-administered medications prior to visit.    PAST MEDICAL HISTORY: Past Medical History  Diagnosis Date  . Seizure   . Hypertension     PAST SURGICAL HISTORY: Past Surgical History  Procedure Laterality Date  . None      FAMILY HISTORY: History reviewed. No pertinent family history.  SOCIAL HISTORY: History   Social History  . Marital Status: Married    Spouse Name: Gailen Shelter    Number of Children: 3  . Years of Education: college   Occupational History  . Not on file.   Social History Main Topics  . Smoking status: Never Smoker   . Smokeless tobacco: Never Used  . Alcohol Use: No  . Drug Use: No  . Sexual Activity: Not on file   Other Topics  Concern  . Not on file   Social History Narrative   Patient lives at home with wife Neoma Laming.    Patient works at [employer].    Patient has 3 children.    Patient has a college education.   Patient is right handed.      PHYSICAL EXAM  Filed Vitals:   07/30/14 0825  BP: 130/79  Pulse: 64  Height: 5\' 11"  (1.803 m)  Weight: 252 lb (114.306 kg)   Body mass index is 35.16 kg/(m^2).  Generalized: Well developed, in no acute distress   Neurological examination  Mentation: Alert oriented to time, place, history taking. Follows all commands speech and language fluent Cranial nerve II-XII: Pupils were equal round reactive to light. Extraocular movements were full, visual field were  full on confrontational test. Facial sensation and strength were normal. Uvula tongue midline. Head turning and shoulder shrug  were normal and symmetric. Motor: The motor testing reveals 5 over 5 strength of all 4 extremities. Good symmetric motor tone is noted throughout.  Sensory: Sensory testing is intact to soft touch on all 4 extremities. No evidence of extinction is noted.  Coordination: Cerebellar testing reveals good finger-nose-finger and heel-to-shin bilaterally.  Gait and station: Gait is normal. Tandem gait is normal. Romberg is negative. No drift is seen.  Reflexes: Deep tendon reflexes are symmetric and normal bilaterally.     DIAGNOSTIC DATA (LABS, IMAGING, TESTING) - I reviewed patient records, labs, notes, testing and imaging myself where available.  Lab Results  Component Value Date   WBC 8.3 03/14/2014   HGB 15.1 03/14/2014   HCT 43.4 03/14/2014   MCV 90 03/14/2014   PLT 273 03/14/2014      Component Value Date/Time   NA 134 05/02/2014 1139   NA 140 10/16/2011 0040   K 4.6 05/02/2014 1139   CL 99 05/02/2014 1139   CO2 25 05/02/2014 1139   GLUCOSE 169* 05/02/2014 1139   GLUCOSE 101* 10/16/2011 0040   BUN 16 05/02/2014 1139   BUN 14 10/16/2011 0040   CREATININE 0.94 05/02/2014 1139   CALCIUM 9.5 05/02/2014 1139   PROT 6.8 05/02/2014 1139   AST 106* 05/02/2014 1139   ALT 106* 05/02/2014 1139   ALKPHOS 63 05/02/2014 1139   BILITOT 0.4 05/02/2014 1139   GFRNONAA 86 05/02/2014 1139   GFRAA 99 05/02/2014 1139      ASSESSMENT AND PLAN 64 y.o. year old male  has a past medical history of Seizure and Hypertension. here with:  1. Seizures  Continue Vimpat 100 mg BID.  Decrease Depakote to 1 tablet BID.  As long as the patient remains seizure free we will continue to wean off Depakote. If you have any additional seizures, please let us know.  Otherwise you will follow-up in 4 months or sooner if needed.      Ward Givens, MSN, NP-C 07/30/2014, 8:31  AM [facility] [address], [place], [place] [address] [phone]  Note: This document was prepared with digital dictation and possible smart phrase technology. Any transcriptional errors that result from this process are unintentional.

## 2014-07-31 ENCOUNTER — Other Ambulatory Visit: Payer: Self-pay | Admitting: Adult Health

## 2014-08-02 ENCOUNTER — Other Ambulatory Visit: Payer: Self-pay | Admitting: Adult Health

## 2014-08-05 NOTE — Telephone Encounter (Signed)
Rx was already sent on 01/21.  I spoke with Judson Roch at the pharmacy and verified they do have this Rx.

## 2014-09-06 ENCOUNTER — Telehealth: Payer: Self-pay | Admitting: Adult Health

## 2014-09-06 MED ORDER — DIVALPROEX SODIUM ER 500 MG PO TB24: 500.0000 mg | ORAL_TABLET | Freq: Every day | ORAL | Status: DC

## 2014-09-06 NOTE — Telephone Encounter (Signed)
I called the patient. He has not had any additional seizures since decreasing the Depakote. We will further decrease the Depakote to 1 tablet at bedtime. If he has any additional seizures he will let us know. Patient continues to be seizure-free we will discontinue the Depakote.

## 2014-12-02 ENCOUNTER — Ambulatory Visit (INDEPENDENT_AMBULATORY_CARE_PROVIDER_SITE_OTHER): Payer: BLUE CROSS/BLUE SHIELD

## 2014-12-02 ENCOUNTER — Ambulatory Visit (INDEPENDENT_AMBULATORY_CARE_PROVIDER_SITE_OTHER): Payer: BLUE CROSS/BLUE SHIELD | Admitting: Urgent Care

## 2014-12-02 VITALS — BP 114/66 | HR 66 | Temp 97.7°F | Resp 14 | Ht 71.0 in | Wt 240.4 lb

## 2014-12-02 DIAGNOSIS — M79631 Pain in right forearm: Secondary | ICD-10-CM

## 2014-12-02 MED ORDER — MELOXICAM 7.5 MG PO TABS: 7.5000 mg | ORAL_TABLET | Freq: Every day | ORAL | Status: DC

## 2014-12-02 NOTE — Progress Notes (Signed)
    MRN: [id number] DOB: 1950/09/05  Subjective:   Samuel Jacobs is a 64 y.o. male presenting for chief complaint of Arm Pain  Reports 4 day history of right forearm pain. Patient was working with a box on Thursday, it slipped and slid down onto his right forearm, felt it scraped and taped up his arm the next day so he could keep working. His forearm pain has worsened since, has difficulty with gripping and now has some numbness and tingling of right thumb and index finger. Has tried ibuprofen with minimal relief. Denies bony deformity, swelling, redness, bruising, decreased ROM, radiation of pain into hand. Denies any other aggravating or relieving factors, no other questions or concerns.  Samuel Jacobs has a current medication list which includes the following prescription(s): b complex vitamins, divalproex, pantoprazole, and vimpat. He has No Known Allergies.  Samuel Jacobs  has a past medical history of Seizure and Hypertension. Also  has past surgical history that includes None.  ROS As in subjective.  Objective:   Vitals: BP 114/66 mmHg  Pulse 66  Temp(Src) 97.7 F (36.5 C) (Oral)  Resp 14  Ht 5\' 11"  (1.803 m)  Wt 240 lb 6.4 oz (109.045 kg)  BMI 33.54 kg/m2  SpO2 98%  Physical Exam  Constitutional: He is oriented to person, place, and time. He appears well-developed and well-nourished.  Cardiovascular: Normal rate.   Pulmonary/Chest: Effort normal.  Musculoskeletal:       Right forearm: He exhibits tenderness. He exhibits no bony tenderness, no swelling, no edema, no deformity and no laceration.  Neurological: He is alert and oriented to person, place, and time.  Skin: Skin is warm and dry. No rash noted. No erythema. No pallor.   [facility] reading (PRIMARY) by  Dr. Joseph Art and PA-Lynden Carrithers. Right forearm: normal x-ray.  Assessment and Plan :   1. Right forearm pain - Advised RICE, wrapped with ACE bandage. Meloxicam for pain. Return to clinic in 2 weeks if pain persists despite conservative  management. Patient agreed.  Jaynee Eagles, PA-C [facility] [phone] 12/02/2014 10:50 AM

## 2014-12-02 NOTE — Patient Instructions (Signed)
Elastic Bandage and RICE °Elastic bandages come in different shapes and sizes. They perform different functions. Your caregiver will help you to decide what is best for your protection, recovery, or rehabilitation following an injury. The following are some general tips to help you use an elastic bandage. °· Use the bandage as directed by the maker of the bandage you are using. °· Do not wrap it too tight. This may cut off the circulation of the arm or leg below the bandage. °· If part of your body beyond the bandage becomes blue, numb, or swollen, it is too tight. Loosen the bandage as needed to prevent these problems. °· See your caregiver or trainer if the bandage seems to be making your problems worse rather than better. °Bandages may be a reminder to you that you have an injury. However, they provide very little support. The few pounds of support they provide are minor considering the pressure it takes to injure a joint or tear ligaments. Therefore, the joint will not be able to handle all of the wear and tear it could before the injury. °The routine care of many injuries includes Rest, Ice, Compression, and Elevation (RICE). °· Rest is required to allow your body to heal. Generally, routine activities can be resumed when comfortable. Injured tendons and bones take about 6 weeks to heal. °· Icing the injury helps keep the swelling down and reduces pain. Do not apply ice directly to the skin. Put ice in a plastic bag. Place a towel between the skin and the bag. This will prevent frostbite to the skin. Apply ice bags to the injured area for 15-20 minutes, every 2 hours while awake. Do this for the first 24 to 48 hours, then as directed by your caregiver. °· Compression helps keep swelling down, gives support, and helps with discomfort. If an elastic bandage has been applied today, it should be removed and reapplied every 3 to 4 hours. It should not be applied tightly, but firmly enough to keep swelling down.  Watch fingers or toes for swelling, bluish discoloration, coldness, numbness, or increased pain. If any of these problems occur, remove the bandage and reapply it more loosely. If these problems persist, contact your caregiver. °· Elevation helps reduce swelling and decreases pain. The injured area (arms, hands, legs, or feet) should be placed near to or above the heart (center of the chest) if able. °Persistent pain and inability to use the injured area for more than 2 to 3 days are warning signs. You should see a caregiver for a follow-up visit as soon as possible. Initially, a minor broken bone (hairline fracture) may not be seen on X-rays. It may take 7 to 10 days to finally show up. Continued pain and swelling show that further evaluation and/or X-rays are needed. Make a follow-up visit with your caregiver. A specialist in reading X-rays (radiologist) will read your X-rays again. °Finding out the results of your test °Not all test results are available during your visit. If your test results are not back during the visit, make an appointment with your caregiver to find out the results. Do not assume everything is normal if you have not heard from your caregiver or the medical facility. It is important for you to follow up on all of your test results. °Document Released: 12/18/2001 Document Revised: 09/20/2011 Document Reviewed: 10/30/2007 °[employer]® Patient Information ©2015 [employer]. This information is not intended to replace advice given to you by your health care provider. Make sure   you discuss any questions you have with your health care provider. ° °

## 2014-12-29 ENCOUNTER — Other Ambulatory Visit: Payer: Self-pay | Admitting: Urgent Care

## 2014-12-30 NOTE — Telephone Encounter (Signed)
Samuel Belts, do you want to give RFs?

## 2014-12-31 NOTE — Telephone Encounter (Signed)
Tried to call pt x 2 and got a Armed forces technical officer message that "call can not be completed as dialed. Please hang up and try at a later time and include area code" (which I did both times).

## 2014-12-31 NOTE — Telephone Encounter (Signed)
Patient has elevated liver enzymes and meloxicam can cause liver toxicity. I saw patient for forearm pain ~1 month ago. If this pain is still not resolved, he should rtc for re-evaluation. If he has pain elsewhere, he should return to clinic for evaluation.  Thank you!

## 2015-01-02 NOTE — Telephone Encounter (Signed)
Was able to reach pt and explained why meloxicam RF was denied. Pt reported that he forearm pain has not really improved much and I advised him to RTC for re-check. Pt agreed and had already been thinking about doing so. He will try to get in tomorrow afternoon to see Ronalee Belts.

## 2015-01-10 ENCOUNTER — Ambulatory Visit: Payer: BLUE CROSS/BLUE SHIELD | Admitting: Podiatry

## 2015-01-29 ENCOUNTER — Other Ambulatory Visit: Payer: Self-pay | Admitting: Neurology

## 2015-01-29 NOTE — Telephone Encounter (Signed)
Patient needs to schedule an appointment. I will refill prescription

## 2015-01-29 NOTE — Telephone Encounter (Signed)
Rx signed and faxed.

## 2015-02-03 ENCOUNTER — Ambulatory Visit (INDEPENDENT_AMBULATORY_CARE_PROVIDER_SITE_OTHER): Payer: BLUE CROSS/BLUE SHIELD | Admitting: Podiatry

## 2015-02-03 ENCOUNTER — Telehealth: Payer: Self-pay | Admitting: Adult Health

## 2015-02-03 ENCOUNTER — Encounter: Payer: Self-pay | Admitting: Podiatry

## 2015-02-03 VITALS — BP 81/61 | HR 67 | Temp 98.0°F | Resp 14

## 2015-02-03 DIAGNOSIS — L603 Nail dystrophy: Secondary | ICD-10-CM | POA: Diagnosis not present

## 2015-02-03 DIAGNOSIS — L600 Ingrowing nail: Secondary | ICD-10-CM

## 2015-02-03 NOTE — Telephone Encounter (Signed)
Pt called and wants to talk about medication. He did not elaborate about which or why. Pt hung up before Information could be obtained. Call back [phone]

## 2015-02-03 NOTE — Telephone Encounter (Signed)
Pt called back about VIMPAT 100 MG TABS. He states that he was given a discount card when he was here last, however the card has expired. He wants to know if he can have another one. Please call and advise [phone]

## 2015-02-03 NOTE — Patient Instructions (Signed)

## 2015-02-03 NOTE — Progress Notes (Signed)
Subjective:    Patient ID: Samuel Jacobs, male    DOB: November 09, 1950, 64 y.o.   MRN: [id number]  HPI 64 year old male presents the operative concerns of the right painful ingrown toenail which has been ongoing for prostate 5-6 months. He states that previously the area was very red and swollen and he has some drainage from the area however this is significantly improved although he does continue to have pain to the nail border. He is tried to trim the area himself without any relief. He also states a left big toenail did follow-up proximal one year ago has been slightly discolored since then. Denies any pain to the area and denies any redness or drainage. No other complaints at this time. Review of Systems  HENT:       Ringing in ears, sinus problems and sneezing  Gastrointestinal: Positive for diarrhea.  Endocrine: Positive for heat intolerance.  Musculoskeletal: Positive for back pain and gait problem.  Allergic/Immunologic: Positive for environmental allergies.  Neurological: Positive for tremors and seizures.  Psychiatric/Behavioral: The patient is nervous/anxious.        Objective:   Physical Exam AAO x3, NAD DP/PT pulses palpable bilaterally, CRT less than 3 seconds Protective sensation intact with Simms Weinstein monofilament, vibratory sensation intact, Achilles tendon reflex intact There is evidence of incurvation on the medial aspect of the right hallux toenail with tenderness to palpation overlying the area. There is a small amount of edema directly localized the nail border. There is no associate erythema, ascending cellulitis, drainage/purulence. There is no tenderness the lateral nail border of the right side. The left hallux toenail does appear to be slightly dystrophic and discolored. There is no surrounding erythema or drainage. The remaining toenails without pathology. No areas of tenderness to bilateral lower extremities. MMT 5/5, ROM WNL.  No open lesions or pre-ulcerative  lesions.  No overlying edema, erythema, increase in warmth to bilateral lower extremities.  No pain with calf compression, swelling, warmth, erythema bilaterally.      Assessment & Plan:  64 year old male right medial hallux ingrown toenail, symptomatic; left hallux discoloration, onychodystrophy -Treatment options discussed including all alternatives, risks, and complications -Discussed etiology of the left hallux toenail and treatment options. He will conciser -At this time, the patient is requesting partial nail removal with chemical matricectomy to the symptomatic portion of the nail. Risks and complications were discussed with the patient for which they understand and  verbally consent to the procedure. Under sterile conditions a total of 3 mL of a mixture of 2% lidocaine plain and 0.5% Marcaine plain was infiltrated in a hallux block fashion. Once anesthetized, the skin was prepped in sterile fashion. A tourniquet was then applied. Next the medial aspect of hallux nail border was then sharply excised making sure to remove the entire offending nail border. Once the nails were ensured to be removed area was debrided and the underlying skin was intact. There is no purulence identified in the procedure. Next phenol was then applied under standard conditions and copiously irrigated. Silvadene was applied. A dry sterile dressing was applied. After application of the dressing the tourniquet was removed and there is found to be an immediate capillary refill time to the digit. The patient tolerated the procedure well any complications. Post procedure instructions were discussed the patient for which he verbally understood. Follow-up in one week for nail check or sooner if any problems are to arise. Discussed signs/symptoms of infection and directed to call the office immediately should any occur  or go directly to the emergency room. In the meantime, encouraged to call the office with any questions, concerns,  changes symptoms. Follow-up with PCP for other issues mentioned in the ROS.   Celesta Gentile, DPM

## 2015-02-05 NOTE — Telephone Encounter (Signed)
Spoke to [place] - we only have expired cards here - instructed patient to go to [email or web address] and he can register for the valid savings card.  He was agreeable to this plan.

## 2015-02-11 ENCOUNTER — Telehealth: Payer: Self-pay | Admitting: *Deleted

## 2015-02-11 NOTE — Telephone Encounter (Signed)
Labs received from [facility] (collected on 01/16/15):  CMP: Creatinine 0.9mg /dl  CBC: WBC 8.70K/uL  LIPID: LDL 119mg /dl  TSH: 1.00uIU/ml  A1C: 5.7%

## 2015-02-17 ENCOUNTER — Encounter: Payer: Self-pay | Admitting: Adult Health

## 2015-02-17 ENCOUNTER — Ambulatory Visit (INDEPENDENT_AMBULATORY_CARE_PROVIDER_SITE_OTHER): Payer: BLUE CROSS/BLUE SHIELD | Admitting: Adult Health

## 2015-02-17 VITALS — BP 110/71 | HR 61 | Ht 71.0 in | Wt 233.0 lb

## 2015-02-17 DIAGNOSIS — R569 Unspecified convulsions: Secondary | ICD-10-CM

## 2015-02-17 MED ORDER — LACOSAMIDE 150 MG PO TABS: 150.0000 mg | ORAL_TABLET | Freq: Two times a day (BID) | ORAL | Status: DC

## 2015-02-17 NOTE — Patient Instructions (Signed)
Stop Depakote Increase Vimpat 150 mg BID Refrain from driving for the next month.

## 2015-02-17 NOTE — Progress Notes (Signed)
I have reviewed and agreed above plan. 

## 2015-02-17 NOTE — Progress Notes (Signed)
PATIENT: Samuel Jacobs DOB: [birth year]  REASON FOR VISIT: follow up- seizures HISTORY FROM: patient  HISTORY OF PRESENT ILLNESS: [name] is a 64 year old male with a history of seizures. He returns today for follow-up. The patient has been trying to wean off of Depakote. He is currently only taking one tablet daily. He continues on Vimpat 100 mg twice a day. She denies any seizure events. He states that in the past his seizures have consisted of staring episodes. The patient is able to complete all ADLs independently. He operates a Teacher, music without difficulty. He denies any new neurological symptoms. He returns today for follow-up.  HISTORY 07/30/14: [name] is a 64 year old male with a history of seizures. He returns today for follow-up. He is currently taking Depakote 1 tablet in the morning and 2 tablets in the evening as well as vimpat 100 mg BID. He reports that he has not had any additional seizures. He does not operate a motor vehicle. He is able to complete all ADLs independently. Patient states that at night when he lays down and gets relaxed he will have jerking movements in his arms and legs. He states that this does not bother him and he is able to fall asleep. He states that he sleeps well at night. Overall he feels that he is doing well since starting Vimpat.   HISTORY 03/14/14: [name] is a 64 year old male with a history of seizures. He returns today stating that he has had three seizures. His seizures consist of staring off but it starts with an odor that he smells. He states before this he may have had 1 seizure a month and sometimes the smell will only occur. He is currently taking Depakote 500 mg 2 tablets BID. He confirms that he has been getting adequate sleep. He states that he sleeps 4-5 hours at night but it has been this way for the last year. Denies any recent sickness or infection. Denies any changes in his Depakote pills. He has good compliance with his medication.  Denies any new neurological compliant since last seen. He does state that for the last month is has what he describes as a "rotating light" that he sees in his peripheral vision on the right side. He states that he only sees it out of his right eye. Denies any headache associated with the rotating light. He only sees the light at night  REVIEW OF SYSTEMS: Out of a complete 14 system review of symptoms, the patient complains only of the following symptoms, and all other reviewed systems are negative.  See history of present illness  ALLERGIES: No Known Allergies  HOME MEDICATIONS: Outpatient Prescriptions Prior to Visit  Medication Sig Dispense Refill  . b complex vitamins tablet Take 1 tablet by mouth daily.    . divalproex (DEPAKOTE ER) 500 MG 24 hr tablet Take 1 tablet (500 mg total) by mouth daily. Take one tablet PO in the morning and one tablets PO at night. 90 tablet 6  . fluticasone (FLONASE) 50 MCG/ACT nasal spray As needed    . meloxicam (MOBIC) 7.5 MG tablet Take 1 tablet (7.5 mg total) by mouth daily. 30 tablet 0  . pantoprazole (PROTONIX) 40 MG tablet Take 40 mg by mouth daily.    . traMADol (ULTRAM) 50 MG tablet Take 50 mg by mouth every 8 (eight) hours as needed. for pain  3  . VIMPAT 100 MG TABS TAKE 1 TABLET BY MOUTH TWICE A  DAY 60 tablet 0   No facility-administered medications prior to visit.    PAST MEDICAL HISTORY: Past Medical History  Diagnosis Date  . Seizure   . Hypertension     PAST SURGICAL HISTORY: Past Surgical History  Procedure Laterality Date  . None      FAMILY HISTORY: No family history on file.  SOCIAL HISTORY: History   Social History  . Marital Status: Married    Spouse Name: Gailen Shelter  . Number of Children: 3  . Years of Education: college   Occupational History  . Not on file.   Social History Main Topics  . Smoking status: Never Smoker   . Smokeless tobacco: Never Used  . Alcohol Use: No  . Drug Use: No  . Sexual  Activity: Not on file   Other Topics Concern  . Not on file   Social History Narrative   Patient lives at home with wife Neoma Laming.    Patient has retired and works part time at [employer]   Patient has 3 children.    Patient has a college education.   Patient is right handed.      PHYSICAL EXAM  Filed Vitals:   02/17/15 0757  BP: 110/71  Pulse: 61  Height: 5\' 11"  (1.803 m)  Weight: 233 lb (105.688 kg)   Body mass index is 32.51 kg/(m^2).  Generalized: Well developed, in no acute distress   Neurological examination  Mentation: Alert oriented to time, place, history taking. Follows all commands speech and language fluent Cranial nerve II-XII: Pupils were equal round reactive to light. Extraocular movements were full, visual field were full on confrontational test. Facial sensation and strength were normal. Uvula tongue midline. Head turning and shoulder shrug  were normal and symmetric. Motor: The motor testing reveals 5 over 5 strength of all 4 extremities. Good symmetric motor tone is noted throughout.  Sensory: Sensory testing is intact to soft touch on all 4 extremities. No evidence of extinction is noted.  Coordination: Cerebellar testing reveals good finger-nose-finger and heel-to-shin bilaterally.  Gait and station: Gait is normal. Tandem gait is normal. Romberg is negative. No drift is seen.  Reflexes: Deep tendon reflexes are symmetric and normal bilaterally.   DIAGNOSTIC DATA (LABS, IMAGING, TESTING) - I reviewed patient records, labs, notes, testing and imaging myself where available.  Lab Results  Component Value Date   WBC 8.3 03/14/2014   HGB 15.1 03/14/2014   HCT 43.4 03/14/2014   MCV 90 03/14/2014   PLT 273 03/14/2014      Component Value Date/Time   NA 134 05/02/2014 1139   NA 140 10/16/2011 0040   K 4.6 05/02/2014 1139   CL 99 05/02/2014 1139   CO2 25 05/02/2014 1139   GLUCOSE 169* 05/02/2014 1139   GLUCOSE 101* 10/16/2011 0040   BUN 16  05/02/2014 1139   BUN 14 10/16/2011 0040   CREATININE 0.94 05/02/2014 1139   CALCIUM 9.5 05/02/2014 1139   PROT 6.8 05/02/2014 1139   AST 106* 05/02/2014 1139   ALT 106* 05/02/2014 1139   ALKPHOS 63 05/02/2014 1139   BILITOT 0.4 05/02/2014 1139   GFRNONAA 86 05/02/2014 1139   GFRAA 99 05/02/2014 1139      ASSESSMENT AND PLAN 64 y.o. year old male  has a past medical history of Seizure and Hypertension. here with:  1. Seizures  Overall the patient has done well. The patient will stop Depakote. I will increase Vimpat to 150 mg twice a day. I  have advised the patient that he should refrain from driving for the next 1-2 months. Patient verbalized understanding. If the patient has any seizure events he should let us know. He will follow-up in 3-4 months or sooner if needed.     Ward Givens, MSN, NP-C 02/17/2015, 8:27 AM [facility] [address] [place], [place] [address] [phone]  Note: This document was prepared with digital dictation and possible smart phrase technology. Any transcriptional errors that result from this process are unintentional.

## 2015-02-21 ENCOUNTER — Encounter: Payer: Self-pay | Admitting: Podiatry

## 2015-02-21 ENCOUNTER — Ambulatory Visit (INDEPENDENT_AMBULATORY_CARE_PROVIDER_SITE_OTHER): Payer: BLUE CROSS/BLUE SHIELD | Admitting: Podiatry

## 2015-02-21 VITALS — BP 134/79 | HR 69 | Resp 14

## 2015-02-21 DIAGNOSIS — Z9889 Other specified postprocedural states: Secondary | ICD-10-CM

## 2015-02-21 DIAGNOSIS — L600 Ingrowing nail: Secondary | ICD-10-CM

## 2015-02-27 ENCOUNTER — Encounter: Payer: Self-pay | Admitting: Podiatry

## 2015-02-27 NOTE — Progress Notes (Signed)
Patient ID: Samuel Jacobs, male   DOB: 1951/02/13, 64 y.o.   MRN: [id number]  Subjective: 64 year old male presents the office today status post right medial hallux ingrown toenail removal. He states that overall he is doing well and was of the areas healed. He denies any pain to the area and denies any surrounding edema, erythema, drainage/purulence. He's able to over exertion without problems. Denies any systemic complaints as fevers, chills, nausea, vomiting. No calf pain, chest pain concerns breath. No other complaints at this time.  Objective: AAO 3, NAD Neurovascular status unchanged Status post right medial hallux ingrown toenail. The procedure site has healed. There is no swelling erythema, edema, drainage/purulence. There is no tenderness palpation. No other areas of pain to bilateral lower joints. No open lesions or pre-ulcerative lesions. No pain with calf compression, swelling, warmth, erythema.  Assessment: 64 year old male with healed right medial hallux status post partial nail avulsion  Plan: At this time there appears to be healed he is having no problems. Recommended to monitor for any reoccurrence. If there are any questions, concerns, change in symptoms discussed with him to call the office.  Celesta Gentile, DPM

## 2015-04-15 ENCOUNTER — Emergency Department (HOSPITAL_COMMUNITY): Payer: BLUE CROSS/BLUE SHIELD

## 2015-04-15 ENCOUNTER — Encounter (HOSPITAL_COMMUNITY): Payer: Self-pay | Admitting: Emergency Medicine

## 2015-04-15 ENCOUNTER — Emergency Department (HOSPITAL_COMMUNITY)
Admission: EM | Admit: 2015-04-15 | Discharge: 2015-04-15 | Disposition: A | Discharge status: Home / Self Care | Payer: BLUE CROSS/BLUE SHIELD | Attending: Emergency Medicine | Admitting: Emergency Medicine

## 2015-04-15 DIAGNOSIS — R112 Nausea with vomiting, unspecified: Secondary | ICD-10-CM | POA: Diagnosis present

## 2015-04-15 DIAGNOSIS — R42 Dizziness and giddiness: Secondary | ICD-10-CM | POA: Diagnosis not present

## 2015-04-15 DIAGNOSIS — K920 Hematemesis: Secondary | ICD-10-CM | POA: Diagnosis not present

## 2015-04-15 DIAGNOSIS — Z7951 Long term (current) use of inhaled steroids: Secondary | ICD-10-CM | POA: Diagnosis not present

## 2015-04-15 DIAGNOSIS — Z79899 Other long term (current) drug therapy: Secondary | ICD-10-CM | POA: Insufficient documentation

## 2015-04-15 DIAGNOSIS — I10 Essential (primary) hypertension: Secondary | ICD-10-CM | POA: Diagnosis not present

## 2015-04-15 LAB — COMPREHENSIVE METABOLIC PANEL
ALK PHOS: 61 U/L (ref 38–126)
ALT: 31 U/L (ref 17–63)
AST: 29 U/L (ref 15–41)
Albumin: 4.5 g/dL (ref 3.5–5.0)
Anion gap: 8 (ref 5–15)
BILIRUBIN TOTAL: 0.3 mg/dL (ref 0.3–1.2)
BUN: 18 mg/dL (ref 6–20)
CALCIUM: 9.4 mg/dL (ref 8.9–10.3)
CO2: 27 mmol/L (ref 22–32)
CREATININE: 0.9 mg/dL (ref 0.61–1.24)
Chloride: 103 mmol/L (ref 101–111)
Glucose, Bld: 150 mg/dL — ABNORMAL HIGH (ref 65–99)
Potassium: 4.7 mmol/L (ref 3.5–5.1)
Sodium: 138 mmol/L (ref 135–145)
TOTAL PROTEIN: 7.7 g/dL (ref 6.5–8.1)

## 2015-04-15 LAB — CBG MONITORING, ED: Glucose-Capillary: 159 mg/dL — ABNORMAL HIGH (ref 65–99)

## 2015-04-15 LAB — CBC
HCT: 40.1 % (ref 39.0–52.0)
Hemoglobin: 14.2 g/dL (ref 13.0–17.0)
MCH: 30.8 pg (ref 26.0–34.0)
MCHC: 35.4 g/dL (ref 30.0–36.0)
MCV: 87 fL (ref 78.0–100.0)
PLATELETS: 269 10*3/uL (ref 150–400)
RBC: 4.61 MIL/uL (ref 4.22–5.81)
RDW: 13 % (ref 11.5–15.5)
WBC: 10.5 10*3/uL (ref 4.0–10.5)

## 2015-04-15 LAB — URINALYSIS, ROUTINE W REFLEX MICROSCOPIC
Glucose, UA: NEGATIVE mg/dL
Hgb urine dipstick: NEGATIVE
KETONES UR: NEGATIVE mg/dL
LEUKOCYTES UA: NEGATIVE
NITRITE: NEGATIVE
PROTEIN: NEGATIVE mg/dL
Specific Gravity, Urine: 1.034 — ABNORMAL HIGH (ref 1.005–1.030)
Urobilinogen, UA: 1 mg/dL (ref 0.0–1.0)
pH: 5.5 (ref 5.0–8.0)

## 2015-04-15 LAB — LIPASE, BLOOD: LIPASE: 26 U/L (ref 22–51)

## 2015-04-15 MED ORDER — SUCRALFATE 1 GM/10ML PO SUSP: 1.0000 g | Freq: Three times a day (TID) | ORAL | Status: DC

## 2015-04-15 MED ORDER — GI COCKTAIL ~~LOC~~
30.0000 mL | Freq: Once | ORAL | Status: AC
  Administered 2015-04-15: 30 mL via ORAL
  Filled 2015-04-15: qty 30

## 2015-04-15 NOTE — ED Provider Notes (Signed)
CSN: [id number]     Arrival date & time 04/15/15  0636 History   First MD Initiated Contact with Patient 04/15/15 [phone]     Chief Complaint  Patient presents with  . Nausea  . Hematemesis   HPI  Patient presents with concern of ongoing morning hemoptysis, and new hematemesis today, with brown material. He notes that over the past month he has had episode of coughing up blood every morning. Until today, no episodes of vomiting. Today, he had an unusual event, when he had hematemesis in addition to his typical bloody sputum. There is associated mild LH, no syncope, no CP, no abd pain. He does not smoke, does not drink.  He does acknowledge a Hx of GERD w BID PPI dosing.     Past Medical History  Diagnosis Date  . Seizure ([place])   . Hypertension    Past Surgical History  Procedure Laterality Date  . None     Family History  Problem Relation Age of Onset  . Cancer Mother   . Cancer Father    Social History  Substance Use Topics  . Smoking status: Never Smoker   . Smokeless tobacco: Never Used  . Alcohol Use: No    Review of Systems  Constitutional:       Per HPI, otherwise negative  HENT:       Per HPI, otherwise negative  Respiratory:       Per HPI, otherwise negative  Cardiovascular:       Per HPI, otherwise negative  Gastrointestinal: Positive for nausea and vomiting. Negative for abdominal pain.  Endocrine:       Negative aside from HPI  Genitourinary:       Neg aside from HPI   Musculoskeletal:       Per HPI, otherwise negative  Skin: Negative.   Neurological: Positive for light-headedness. Negative for syncope.      Allergies  Review of patient's allergies indicates no known allergies.  Home Medications   Prior to Admission medications   Medication Sig Start Date End Date Taking? Authorizing Provider  b complex vitamins tablet Take 1 tablet by mouth daily.   Yes Historical Provider, MD  Cholecalciferol (VITAMIN D PO) Take 1 tablet by mouth  daily.   Yes Historical Provider, MD  fluticasone (FLONASE) 50 MCG/ACT nasal spray Place 1 spray into both nostrils daily as needed for allergies.  11/26/14  Yes Historical Provider, MD  glucosamine-chondroitin 500-400 MG tablet Take 1 tablet by mouth daily.   Yes Historical Provider, MD  Lacosamide (VIMPAT) 150 MG TABS Take 1 tablet (150 mg total) by mouth 2 (two) times daily. 02/17/15  Yes Ward Givens, NP  Multiple Vitamins-Minerals (CENTRUM SILVER ADULT 50+) TABS Take 1 tablet by mouth daily.   Yes Historical Provider, MD  pantoprazole (PROTONIX) 40 MG tablet Take 40 mg by mouth 2 (two) times daily.    Yes Historical Provider, MD  thiamine (VITAMIN B-1) 100 MG tablet Take 100 mg by mouth daily.   Yes Historical Provider, MD  traMADol (ULTRAM) 50 MG tablet Take 100 mg by mouth 2 (two) times daily as needed for moderate pain. for pain 01/23/15  Yes Historical Provider, MD  sucralfate (CARAFATE) 1 GM/10ML suspension Take 10 mLs (1 g total) by mouth 4 (four) times daily -  with meals and at bedtime. 04/15/15   Carmin Muskrat, MD   BP 139/75 mmHg  Pulse 59  Temp(Src) 97.9 F (36.6 C) (Oral)  Resp 15  SpO2 97%  Physical Exam  Constitutional: He is oriented to person, place, and time. He appears well-developed. No distress.  HENT:  Head: Normocephalic and atraumatic.  Nose: Nose normal.  Mouth/Throat: Oropharynx is clear and moist. No oropharyngeal exudate.  Eyes: Conjunctivae and EOM are normal.  Cardiovascular: Normal rate and regular rhythm.   Pulmonary/Chest: Effort normal. No stridor. No respiratory distress.  Abdominal: He exhibits no distension.  Musculoskeletal: He exhibits no edema.  Neurological: He is alert and oriented to person, place, and time.  Skin: Skin is warm and dry.  Psychiatric: He has a normal mood and affect.  Nursing note and vitals reviewed.   ED Course  Procedures (including critical care time) Labs Review Labs Reviewed  COMPREHENSIVE METABOLIC PANEL -  Abnormal; Notable for the following:    Glucose, Bld 150 (*)    All other components within normal limits  URINALYSIS, ROUTINE W REFLEX MICROSCOPIC (NOT AT [facility]) - Abnormal; Notable for the following:    Color, Urine AMBER (*)    APPearance CLOUDY (*)    Specific Gravity, Urine 1.034 (*)    Bilirubin Urine SMALL (*)    All other components within normal limits  CBG MONITORING, ED - Abnormal; Notable for the following:    Glucose-Capillary 159 (*)    All other components within normal limits  LIPASE, BLOOD  CBC    Imaging Review Dg Chest 2 View  04/15/2015   CLINICAL DATA:  Hemoptysis for 1 month  EXAM: CHEST  2 VIEW  COMPARISON:  None.  FINDINGS: There is slight scarring in the left lung base. There is no edema or consolidation. The heart size and pulmonary vascularity are normal. No adenopathy. There is mild degenerative change in the thoracic spine.  IMPRESSION: Slight scarring left base.  No edema or consolidation.   Electronically Signed   By: Lowella Grip III M.D.   On: 04/15/2015 07:48   I have personally reviewed and evaluated these images and lab results as part of my medical decision-making.  O2- 99%ra, nml  9:15 AM PAtient in no distress, states that he feels better.  All results d/w with the patient and his wife.  GI F/U. MDM   Final diagnoses:  Hematemesis with nausea   Patient presents after one episode of hematemesis, following weeks of morning bloody sputum. Here the patient is awake, alert, neurologically intact, hemodynamically stable, with no evidence for hemodynamic compromise, active bleeding. Given the patient's history of gastroesophageal disease, there suspicion for this as etiology. Patient does not smoke, has unremarkable chest x-ray, there is low suspicion for pulmonary etiology. Patient discharged with GI follow-up following initiation of Carafate, in addition to continued PPI.  Carmin Muskrat, MD 04/15/15 [phone]

## 2015-04-15 NOTE — Discharge Instructions (Signed)
As discussed, today's evaluation has been largely reassuring.  Your episodes of vomiting blood require additional evaluation by our gastroenterology colleagues.  Please call today for an appointment this week.  Return here for concerning changes in your condition.

## 2015-04-15 NOTE — ED Notes (Signed)
Pt states he is very nauseated this morning and that he has vomited once  Pt states the emesis is brown and has blood in it   Pt states he has been coughing up blood for a month or so now and has not been anywhere to have that evaluated  Pt states he is dizzy Pt denies pain anywhere

## 2015-04-15 NOTE — ED Notes (Signed)
Report given to [facility]. Care relinquished.

## 2015-04-23 ENCOUNTER — Encounter: Payer: Self-pay | Admitting: Internal Medicine

## 2015-05-15 ENCOUNTER — Ambulatory Visit (INDEPENDENT_AMBULATORY_CARE_PROVIDER_SITE_OTHER): Payer: BLUE CROSS/BLUE SHIELD | Admitting: Emergency Medicine

## 2015-05-15 VITALS — BP 130/80 | HR 64 | Temp 98.2°F | Resp 16 | Ht 71.0 in | Wt 240.0 lb

## 2015-05-15 DIAGNOSIS — J0140 Acute pansinusitis, unspecified: Secondary | ICD-10-CM | POA: Diagnosis not present

## 2015-05-15 DIAGNOSIS — J209 Acute bronchitis, unspecified: Secondary | ICD-10-CM

## 2015-05-15 MED ORDER — PSEUDOEPHEDRINE-GUAIFENESIN ER 60-600 MG PO TB12: 1.0000 | ORAL_TABLET | Freq: Two times a day (BID) | ORAL | Status: DC

## 2015-05-15 MED ORDER — AMOXICILLIN-POT CLAVULANATE 875-125 MG PO TABS: 1.0000 | ORAL_TABLET | Freq: Two times a day (BID) | ORAL | Status: DC

## 2015-05-15 MED ORDER — HYDROCOD POLST-CPM POLST ER 10-8 MG/5ML PO SUER: 5.0000 mL | Freq: Two times a day (BID) | ORAL | Status: DC

## 2015-05-15 NOTE — Progress Notes (Signed)
Subjective:  Patient ID: Samuel Jacobs, male    DOB: [birth year]  Age: 64 y.o. MRN: [id number]  CC: Nasal Congestion; Cough; and Sore Throat   HPI Samuel Jacobs presents   With nasal congestion pressure of her cheeks and forehead. Renal and nasal drainage and postnasal drip. He has a cough productive of purulent sputum. Has no wheezing or shortness of breath. His cough is worse at night. He's had no fever chills but does feel fatigue. He has no improvement with over-the-counter medication.  History Samuel Jacobs has a past medical history of Seizure ([place]) and Hypertension.   He has past surgical history that includes None.   His  family history includes Cancer in his father and mother.  He   reports that he has never smoked. He has never used smokeless tobacco. He reports that he does not drink alcohol or use illicit drugs.  Outpatient Prescriptions Prior to Visit  Medication Sig Dispense Refill  . b complex vitamins tablet Take 1 tablet by mouth daily.    . Cholecalciferol (VITAMIN D PO) Take 1 tablet by mouth daily.    . fluticasone (FLONASE) 50 MCG/ACT nasal spray Place 1 spray into both nostrils daily as needed for allergies.     Marland Kitchen glucosamine-chondroitin 500-400 MG tablet Take 1 tablet by mouth daily.    . Lacosamide (VIMPAT) 150 MG TABS Take 1 tablet (150 mg total) by mouth 2 (two) times daily. 60 tablet 5  . Multiple Vitamins-Minerals (CENTRUM SILVER ADULT 50+) TABS Take 1 tablet by mouth daily.    . pantoprazole (PROTONIX) 40 MG tablet Take 40 mg by mouth 2 (two) times daily.     . sucralfate (CARAFATE) 1 GM/10ML suspension Take 10 mLs (1 g total) by mouth 4 (four) times daily -  with meals and at bedtime. 420 mL 0  . thiamine (VITAMIN B-1) 100 MG tablet Take 100 mg by mouth daily.    . traMADol (ULTRAM) 50 MG tablet Take 100 mg by mouth 2 (two) times daily as needed for moderate pain. for pain  3   No facility-administered medications prior to visit.    Social History   Social History   . Marital Status: Married    Spouse Name: Samuel Jacobs  . Number of Children: 3  . Years of Education: college   Social History Main Topics  . Smoking status: Never Smoker   . Smokeless tobacco: Never Used  . Alcohol Use: No  . Drug Use: No  . Sexual Activity: Not Asked   Other Topics Concern  . None   Social History Narrative   Patient lives at home with wife Samuel Jacobs.    Patient has retired and works part time at [employer]   Patient has 3 children.    Patient has a college education.   Patient is right handed.     Review of Systems  Constitutional: Negative for fever, chills and appetite change.  HENT: Positive for congestion, postnasal drip, rhinorrhea and sinus pressure. Negative for ear pain and sore throat.   Eyes: Negative for pain and redness.  Respiratory: Positive for cough. Negative for shortness of breath and wheezing.   Cardiovascular: Negative for leg swelling.  Gastrointestinal: Negative for nausea, vomiting, abdominal pain, diarrhea, constipation and blood in stool.  Endocrine: Negative for polyuria.  Genitourinary: Negative for dysuria, urgency, frequency and flank pain.  Musculoskeletal: Negative for gait problem.  Skin: Negative for rash.  Neurological: Negative for weakness and headaches.  Psychiatric/Behavioral: Negative for confusion  and decreased concentration. The patient is not nervous/anxious.     Objective:  BP 130/80 mmHg  Pulse 64  Temp(Src) 98.2 F (36.8 C) (Oral)  Resp 16  Ht 5\' 11"  (1.803 m)  Wt 240 lb (108.863 kg)  BMI 33.49 kg/m2  SpO2 96%  Physical Exam  Constitutional: He is oriented to person, place, and time. He appears well-developed and well-nourished. No distress.  HENT:  Head: Normocephalic and atraumatic.  Right Ear: External ear normal.  Left Ear: External ear normal.  Nose: Nose normal.  Eyes: Conjunctivae and EOM are normal. Pupils are equal, round, and reactive to light. No scleral icterus.  Neck: Normal range  of motion. Neck supple. No tracheal deviation present.  Cardiovascular: Normal rate, regular rhythm and normal heart sounds.   Pulmonary/Chest: Effort normal. No respiratory distress. He has no wheezes. He has no rales.  Abdominal: He exhibits no mass. There is no tenderness. There is no rebound and no guarding.  Musculoskeletal: He exhibits no edema.  Lymphadenopathy:    He has no cervical adenopathy.  Neurological: He is alert and oriented to person, place, and time. Coordination normal.  Skin: Skin is warm and dry. No rash noted.  Psychiatric: He has a normal mood and affect. His behavior is normal.      Assessment & Plan:   Delta was seen today for nasal congestion, cough and sore throat.  Diagnoses and all orders for this visit:  Acute bronchitis, unspecified organism  Acute pansinusitis, recurrence not specified  Other orders -     chlorpheniramine-HYDROcodone (TUSSIONEX PENNKINETIC ER) 10-8 MG/5ML SUER; Take 5 mLs by mouth 2 (two) times daily. -     pseudoephedrine-guaifenesin (MUCINEX D) 60-600 MG 12 hr tablet; Take 1 tablet by mouth every 12 (twelve) hours. -     amoxicillin-clavulanate (AUGMENTIN) 875-125 MG tablet; Take 1 tablet by mouth 2 (two) times daily.   I am having Mr. Samuel Jacobs start on chlorpheniramine-HYDROcodone, pseudoephedrine-guaifenesin, and amoxicillin-clavulanate. I am also having him maintain his pantoprazole, b complex vitamins, traMADol, fluticasone, Lacosamide, CENTRUM SILVER ADULT 50+, glucosamine-chondroitin, Cholecalciferol (VITAMIN D PO), thiamine, and sucralfate.  Meds ordered this encounter  Medications  . chlorpheniramine-HYDROcodone (TUSSIONEX PENNKINETIC ER) 10-8 MG/5ML SUER    Sig: Take 5 mLs by mouth 2 (two) times daily.    Dispense:  60 mL    Refill:  0  . pseudoephedrine-guaifenesin (MUCINEX D) 60-600 MG 12 hr tablet    Sig: Take 1 tablet by mouth every 12 (twelve) hours.    Dispense:  18 tablet    Refill:  0  . amoxicillin-clavulanate  (AUGMENTIN) 875-125 MG tablet    Sig: Take 1 tablet by mouth 2 (two) times daily.    Dispense:  20 tablet    Refill:  0    Appropriate red flag conditions were discussed with the patient as well as actions that should be taken.  Patient expressed his understanding.  Follow-up: Return if symptoms worsen or fail to improve.  Roselee Culver, MD

## 2015-05-15 NOTE — Patient Instructions (Signed)

## 2015-05-22 ENCOUNTER — Ambulatory Visit (INDEPENDENT_AMBULATORY_CARE_PROVIDER_SITE_OTHER): Payer: BLUE CROSS/BLUE SHIELD | Admitting: Adult Health

## 2015-05-22 ENCOUNTER — Encounter: Payer: Self-pay | Admitting: Adult Health

## 2015-05-22 VITALS — BP 126/72 | HR 69 | Resp 20 | Ht 71.0 in | Wt 237.0 lb

## 2015-05-22 DIAGNOSIS — G40109 Localization-related (focal) (partial) symptomatic epilepsy and epileptic syndromes with simple partial seizures, not intractable, without status epilepticus: Secondary | ICD-10-CM | POA: Diagnosis not present

## 2015-05-22 MED ORDER — LACOSAMIDE 200 MG PO TABS: 200.0000 mg | ORAL_TABLET | Freq: Two times a day (BID) | ORAL | Status: DC

## 2015-05-22 NOTE — Progress Notes (Signed)
I have reviewed and agreed above plan. 

## 2015-05-22 NOTE — Progress Notes (Signed)
PATIENT: Samuel Jacobs DOB: Nov 15, 1950  REASON FOR VISIT: follow up- seizures HISTORY FROM: patient  HISTORY OF PRESENT ILLNESS: Samuel Jacobs is a 64 year old male with a history of seizure events. He returns today for follow-up. He is currently on Vimpat 150 mg twice a day. He reports this has been working well for him. He denies any seizure events. He states that his seizures consist of staring episodes. 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 He works approximately 3 days a week. He states that starting about 1 month ago he's been having episodes that usually start around 3 AM. Sometimes these episodes consist of a "funny feeling in his head" followed by nausea and dizziness. He states that sometimes he actually develops a headache normally located in the frontal region also followed by dizziness and nausea. He states that since he started this third shift job some days he may only get  5 hours of sleep the next day and this is usually interrupted sleep. The patient has had a sleep study in 2011 that showed mild sleep apnea and CPAP was not recommended at that time. The patient's weight has decreased since that sleep study. Patient also states that he takes his medication at the same time every day. Denies missing any medication. He returns today for an evaluation.  HISTORY 02/17/15: Samuel Jacobs is a 64 year old male with a history of seizures. He returns today for follow-up. The patient has been trying to wean off of Depakote. He is currently only taking one tablet daily. He continues on Vimpat 100 mg twice a day. She denies any seizure events. He states that in the past his seizures have consisted of staring episodes. The patient is able to complete all ADLs independently. He operates a Teacher, music without difficulty. He denies any new neurological symptoms. He returns today for follow-up.  HISTORY 07/30/14: Samuel Jacobs is a 63 year old male with a history of seizures. He returns  today for follow-up. He is currently taking Depakote 1 tablet in the morning and 2 tablets in the evening as well as vimpat 100 mg BID. He reports that he has not had any additional seizures. He does not operate a motor vehicle. He is able to complete all ADLs independently. Patient states that at night when he lays down and gets relaxed he will have jerking movements in his arms and legs. He states that this does not bother him and he is able to fall asleep. He states that he sleeps well at night. Overall he feels that he is doing well since starting Vimpat.   HISTORY 03/14/14: Samuel Jacobs is a 64 year old male with a history of seizures. He returns today stating that he has had three seizures. His seizures consist of staring off but it starts with an odor that he smells. He states before this he may have had 1 seizure a month and sometimes the smell will only occur. He is currently taking Depakote 500 mg 2 tablets BID. He confirms that he has been getting adequate sleep. He states that he sleeps 4-5 hours at night but it has been this way for the last year. Denies any recent sickness or infection. Denies any changes in his Depakote pills. He has good compliance with his medication. Denies any new neurological compliant since last seen. He does state that for the last month is has what he describes as a "rotating light" that he sees in his peripheral vision  on the right side. He states that he only sees it out of his right eye. Denies any headache associated with the rotating light. He only sees the light at night   REVIEW OF SYSTEMS: Out of a complete 14 system review of symptoms, the patient complains only of the following symptoms, and all other reviewed systems are negative.  Fatigue, dizziness, tremors, daytime sleepiness, moles  ALLERGIES: Allergies  Allergen Reactions  . Sulfur     nausea    HOME MEDICATIONS: Outpatient Prescriptions Prior to Visit  Medication Sig Dispense Refill  .  amoxicillin-clavulanate (AUGMENTIN) 875-125 MG tablet Take 1 tablet by mouth 2 (two) times daily. 20 tablet 0  . b complex vitamins tablet Take 1 tablet by mouth daily.    . chlorpheniramine-HYDROcodone (TUSSIONEX PENNKINETIC ER) 10-8 MG/5ML SUER Take 5 mLs by mouth 2 (two) times daily. 60 mL 0  . Cholecalciferol (VITAMIN D PO) Take 1 tablet by mouth daily.    . fluticasone (FLONASE) 50 MCG/ACT nasal spray Place 1 spray into both nostrils daily as needed for allergies.     Marland Kitchen glucosamine-chondroitin 500-400 MG tablet Take 1 tablet by mouth daily.    . Lacosamide (VIMPAT) 150 MG TABS Take 1 tablet (150 mg total) by mouth 2 (two) times daily. 60 tablet 5  . Multiple Vitamins-Minerals (CENTRUM SILVER ADULT 50+) TABS Take 1 tablet by mouth daily.    . pantoprazole (PROTONIX) 40 MG tablet Take 40 mg by mouth 2 (two) times daily.     . pseudoephedrine-guaifenesin (MUCINEX D) 60-600 MG 12 hr tablet Take 1 tablet by mouth every 12 (twelve) hours. 18 tablet 0  . sucralfate (CARAFATE) 1 GM/10ML suspension Take 10 mLs (1 g total) by mouth 4 (four) times daily -  with meals and at bedtime. 420 mL 0  . thiamine (VITAMIN B-1) 100 MG tablet Take 100 mg by mouth daily.    . traMADol (ULTRAM) 50 MG tablet Take 100 mg by mouth 2 (two) times daily as needed for moderate pain. for pain  3   No facility-administered medications prior to visit.    PAST MEDICAL HISTORY: Past Medical History  Diagnosis Date  . Seizure ([place])   . Hypertension     PAST SURGICAL HISTORY: Past Surgical History  Procedure Laterality Date  . None      FAMILY HISTORY: Family History  Problem Relation Age of Onset  . Cancer Mother   . Cancer Father     SOCIAL HISTORY: Social History   Social History  . Marital Status: Married    Spouse Name: Gailen Shelter  . Number of Children: 3  . Years of Education: college   Occupational History  . Not on file.   Social History Main Topics  . Smoking status: Never Smoker   .  Smokeless tobacco: Never Used  . Alcohol Use: No  . Drug Use: No  . Sexual Activity: Not on file   Other Topics Concern  . Not on file   Social History Narrative   Patient lives at home with wife Neoma Laming.    Patient has retired and works part time at [employer]   Patient has 3 children.    Patient has a college education.   Patient is right handed.    PHYSICAL EXAM  Filed Vitals:   05/22/15 0722  BP: 126/72  Pulse: 69  Resp: 20  Height: 5\' 11"  (1.803 m)  Weight: 237 lb (107.502 kg)   Body mass index is 33.07 kg/(m^2).  Generalized: Well developed, in no acute distress   Neurological examination  Mentation: Alert oriented to time, place, history taking. Follows all commands speech and language fluent Cranial nerve II-XII: Pupils were equal round reactive to light. Extraocular movements were full, visual field were full on confrontational test. Facial sensation and strength were normal. Uvula tongue midline. Head turning and shoulder shrug  were normal and symmetric. Motor: The motor testing reveals 5 over 5 strength of all 4 extremities. Good symmetric motor tone is noted throughout.  Sensory: Sensory testing is intact to soft touch on all 4 extremities. No evidence of extinction is noted.  Coordination: Cerebellar testing reveals good finger-nose-finger and heel-to-shin bilaterally.  Gait and station: Gait is normal. Tandem gait is normal. Romberg is negative. No drift is seen.  Reflexes: Deep tendon reflexes are symmetric and normal bilaterally.   DIAGNOSTIC DATA (LABS, IMAGING, TESTING) - I reviewed patient records, labs, notes, testing and imaging myself where available.  Lab Results  Component Value Date   WBC 10.5 04/15/2015   HGB 14.2 04/15/2015   HCT 40.1 04/15/2015   MCV 87.0 04/15/2015   PLT 269 04/15/2015      Component Value Date/Time   NA 138 04/15/2015 0658   NA 134 05/02/2014 1139   K 4.7 04/15/2015 0658   CL 103 04/15/2015 0658   CO2 27  04/15/2015 0658   GLUCOSE 150* 04/15/2015 0658   GLUCOSE 169* 05/02/2014 1139   BUN 18 04/15/2015 0658   BUN 16 05/02/2014 1139   CREATININE 0.90 04/15/2015 0658   CALCIUM 9.4 04/15/2015 0658   PROT 7.7 04/15/2015 0658   PROT 6.8 05/02/2014 1139   ALBUMIN 4.5 04/15/2015 0658   ALBUMIN 4.1 05/02/2014 1139   AST 29 04/15/2015 0658   ALT 31 04/15/2015 0658   ALKPHOS 61 04/15/2015 0658   BILITOT 0.3 04/15/2015 0658   GFRNONAA >60 04/15/2015 0658   GFRAA >60 04/15/2015 0658      ASSESSMENT AND PLAN 64 y.o. year old male  has a past medical history of Seizure ([place]) and Hypertension. here with:  1. Seizure events  The patient has been having new episodes that started about 1 month ago-- they consist of a funny feeling in the head followed by nausea and dizziness. Sometimes these events lead to a headache. The patient is now on a third shift job and does not receive adequate sleep each night. I will increase the patient's Vimpat to 200 mg twice a day. I encouraged patient to try to have a first shift job that will allow him to establish a regular sleep routine. The patient is also on Ultram for back pain, I encouraged him to stop this medication as it can decrease the seizure threshold. Patient verbalized understanding. If he continues to have these episodes he will let us know. Of course if he has any seizure events he will also let us know. He will follow-up in 3-4 months with Dr. Krista Blue.  Ward Givens, MSN, NP-C 05/22/2015, 7:32 AM [facility] [address] [place], [place] [address] [phone]

## 2015-05-22 NOTE — Patient Instructions (Signed)
Increase Vimpat 200 mg twice a day. Try to work 1st shift in order to establish a good sleep routine and get adequate sleep Would consider stopping ultram has it can lower the seizure threshold. If your symptoms worsen or you develop new symptoms please let us know.

## 2015-05-23 ENCOUNTER — Encounter: Payer: Self-pay | Admitting: Podiatry

## 2015-05-23 ENCOUNTER — Ambulatory Visit (INDEPENDENT_AMBULATORY_CARE_PROVIDER_SITE_OTHER): Payer: BLUE CROSS/BLUE SHIELD

## 2015-05-23 ENCOUNTER — Ambulatory Visit (INDEPENDENT_AMBULATORY_CARE_PROVIDER_SITE_OTHER): Payer: BLUE CROSS/BLUE SHIELD | Admitting: Podiatry

## 2015-05-23 DIAGNOSIS — R52 Pain, unspecified: Secondary | ICD-10-CM

## 2015-05-23 DIAGNOSIS — M2022 Hallux rigidus, left foot: Secondary | ICD-10-CM

## 2015-05-23 DIAGNOSIS — M2021 Hallux rigidus, right foot: Secondary | ICD-10-CM | POA: Diagnosis not present

## 2015-05-29 DIAGNOSIS — M2022 Hallux rigidus, left foot: Secondary | ICD-10-CM

## 2015-05-29 DIAGNOSIS — M2021 Hallux rigidus, right foot: Secondary | ICD-10-CM | POA: Insufficient documentation

## 2015-05-29 NOTE — Progress Notes (Signed)
Patient ID: Samuel Jacobs, male   DOB: 03-01-1951, 64 y.o.   MRN: [id number]  Subjective: Patient presents the office today for concerns of pain to both of his big toes was on the bottom of the toe. He states that he feels like he is walking on the blister but does not notice a blister any open sores or skin irritation. He denies any recent injury or trauma. This been ongoing the last couple months since I last saw him last. He denies any swelling or redness. No tingling or numbness. No previous treatment. No other complaints at this time.  Objective: AAO 3, NAD DP/PT pulses palpable, CRT less than 3 seconds Protective sensation intact with Derrel Nip monofilament There is significant reduction of first MTPJ range of motion particular in dorsiflexion there is pain with MPJ range of motion. There is palpable exostosis present at the dorsal aspect of the first metatarsal head. Subjective there is tenderness to palpation along the plantar aspect of the hallux just proximal to the level of the IPJ however there is no tenderness to palpation at this time  And the majority of his pain is with weightbearing. There is no open lesions or pre-ulcerative lesions. There is no overlying edema, erythema, increase in warmth. No other areas of tenderness bilateral lower chemise. There is no pain with calf compression, swelling, warmth, erythema.  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  Plan: -Treatment options discussed including all alternatives, risks, and complications -X-rays were obtained and reviewed with the patient.  -Etiology of symptoms were discussed - I discussed orthotics with the patient to help take pressure at the first MTPJ and help limit motion. I discussed shoe gear modifications as well as offloading and padding. He will start with offloading and padding first. If he continues to have symptoms I did discuss with him surgical intervention  due to the hallux rigidus. -Follow-up if symptoms worsen or sooner if any problems arise. In the meantime, encouraged to call the office with any questions, concerns, change in symptoms.   Celesta Gentile, dPM

## 2015-06-23 ENCOUNTER — Encounter: Payer: Self-pay | Admitting: Internal Medicine

## 2015-06-23 ENCOUNTER — Ambulatory Visit (INDEPENDENT_AMBULATORY_CARE_PROVIDER_SITE_OTHER): Payer: BLUE CROSS/BLUE SHIELD | Admitting: Internal Medicine

## 2015-06-23 VITALS — BP 126/76 | HR 68 | Ht 70.25 in | Wt 244.0 lb

## 2015-06-23 DIAGNOSIS — Z86010 Personal history of colonic polyps: Secondary | ICD-10-CM | POA: Diagnosis not present

## 2015-06-23 DIAGNOSIS — K219 Gastro-esophageal reflux disease without esophagitis: Secondary | ICD-10-CM | POA: Diagnosis not present

## 2015-06-23 DIAGNOSIS — R110 Nausea: Secondary | ICD-10-CM

## 2015-06-23 DIAGNOSIS — K920 Hematemesis: Secondary | ICD-10-CM

## 2015-06-23 DIAGNOSIS — K921 Melena: Secondary | ICD-10-CM

## 2015-06-23 MED ORDER — NA SULFATE-K SULFATE-MG SULF 17.5-3.13-1.6 GM/177ML PO SOLN: ORAL | Status: DC

## 2015-06-23 NOTE — Patient Instructions (Signed)
We have sent the following medications to your pharmacy for you to pick up at your convenience: [place] have been scheduled for an endoscopy and colonoscopy. Please follow the written instructions given to you at your visit today. Please pick up your prep supplies at the pharmacy within the next 1-3 days. If you use inhalers (even only as needed), please bring them with you on the day of your procedure.

## 2015-06-23 NOTE — Progress Notes (Signed)
HISTORY OF PRESENT ILLNESS:  Samuel Jacobs is a 64 y.o. male with past medical history as listed below. He is self-referred today regarding chronic GERD, nausea with hematemesis, and surveillance colonoscopy. Previous GI patient of Dr. Clarene Essex who has performed colonoscopy and upper endoscopy. Records have been requested. Patient reports a history of colon polyps [name] 9 years ago and is overdue (according to him) for surveillance exam. Prior upper endoscopy results uncertain, though the patient states that he woke up during the examination. This prompted him switching GI providers. Patient tells me that he has had acid reflux for many years. Currently on twice daily pantoprazole. Despite this he does have occasional breakthrough for which he takes antacids. He describes occasional dysphagia to liquids only. Over the past several months he's had episodes of nausea associated with minor hematemesis. Actually seen in the emergency room on one occasion. Unremarkable evaluation including normal, hence a metabolic panel and CBC 0000000. Patient has general complaints of fatigue which is being evaluated by Dr. Dagmar Hait. He also reports minor rectal bleeding which she attributes to hemorrhoids. Occasional dark stools. 20 pound weight loss over the past year. He works at [employer] as a Clinical research associate. He was urged by his PCP to schedule this appointment  REVIEW OF SYSTEMS:  All non-GI ROS negative except for back pain, fatigue, urinary leakage  Past Medical History  Diagnosis Date  . Seizure ([place])   . Hypertension   . GERD (gastroesophageal reflux disease)   . Depression   . Anxiety   . Arthritis   . Colon polyps   . ED (erectile dysfunction)   . Carpal tunnel syndrome   . Insomnia   . HLD (hyperlipidemia)   . Allergic rhinitis     Past Surgical History  Procedure Laterality Date  . None      Social History [name]  reports that he has never smoked. He has never used smokeless tobacco. He reports  that he does not drink alcohol or use illicit drugs.  family history includes Breast cancer in his mother; Cancer in his maternal grandfather, maternal grandmother, paternal grandfather, and paternal grandmother; Epilepsy in his sister; Stomach cancer in his father.  Allergies  Allergen Reactions  . Sulfur     nausea       PHYSICAL EXAMINATION: Vital signs: BP 126/76 mmHg  Pulse 68  Ht 5' 10.25" (1.784 m)  Wt 244 lb (110.678 kg)  BMI 34.78 kg/m2  Constitutional: generally well-appearing, no acute distress Psychiatric: alert and oriented x3, cooperative Eyes: extraocular movements intact, anicteric, conjunctiva pink Mouth: oral pharynx moist, no lesions Neck: supple without thyromegaly Lymph: no lymphadenopathy Cardiovascular: heart regular rate and rhythm, no murmur Lungs: clear to auscultation bilaterally Abdomen: soft, nontender, nondistended, no obvious ascites, no peritoneal signs, normal bowel sounds, no organomegaly Rectal: Deferred until colonoscopy Extremities: no clubbing cyanosis or lower extremity edema bilaterally Skin: no lesions on visible extremities Neuro: No focal deficits. Normal deep tendon reflexes.    ASSESSMENT:  #1. Nausea with minor hematemesis. Needs endoscopic evaluation #2. Long-standing GERD. Does have breakthrough symptoms despite high-dose PPI #3. Reports history of colon polyps. Outside records being requested for review.   PLAN:  #1. Reflux precautions #2. Continue PPI #3. Schedule upper endoscopy.The nature of the procedure, as well as the risks, benefits, and alternatives were carefully and thoroughly reviewed with the patient. Ample time for discussion and questions allowed. The patient understood, was satisfied, and agreed to proceed. #4. Schedule surveillance colonoscopy.The nature of the  procedure, as well as the risks, benefits, and alternatives were carefully and thoroughly reviewed with the patient. Ample time for discussion and  questions allowed. The patient understood, was satisfied, and agreed to proceed. #5. Review outside records if / when they become available

## 2015-07-09 ENCOUNTER — Telehealth: Payer: Self-pay | Admitting: Internal Medicine

## 2015-07-09 NOTE — Telephone Encounter (Signed)
Rec'd from [facility] forward 20 pages to Dr.Perry

## 2015-08-13 ENCOUNTER — Encounter: Payer: Self-pay | Admitting: Internal Medicine

## 2015-08-13 ENCOUNTER — Ambulatory Visit (AMBULATORY_SURGERY_CENTER): Payer: BLUE CROSS/BLUE SHIELD | Admitting: Internal Medicine

## 2015-08-13 VITALS — BP 126/76 | HR 56 | Temp 98.0°F | Resp 14 | Ht 70.25 in | Wt 244.0 lb

## 2015-08-13 DIAGNOSIS — K929 Disease of digestive system, unspecified: Secondary | ICD-10-CM | POA: Diagnosis not present

## 2015-08-13 DIAGNOSIS — K621 Rectal polyp: Secondary | ICD-10-CM

## 2015-08-13 DIAGNOSIS — D128 Benign neoplasm of rectum: Secondary | ICD-10-CM

## 2015-08-13 DIAGNOSIS — K219 Gastro-esophageal reflux disease without esophagitis: Secondary | ICD-10-CM | POA: Diagnosis not present

## 2015-08-13 DIAGNOSIS — Z86010 Personal history of colonic polyps: Secondary | ICD-10-CM | POA: Diagnosis not present

## 2015-08-13 DIAGNOSIS — D132 Benign neoplasm of duodenum: Secondary | ICD-10-CM

## 2015-08-13 MED ORDER — SODIUM CHLORIDE 0.9 % IV SOLN: 500.0000 mL | INTRAVENOUS | Status: DC

## 2015-08-13 NOTE — Op Note (Signed)
[place]  [address]. [place], [address]   ENDOSCOPY PROCEDURE REPORT  PATIENT: Twyman, Prusak  MR#: [id number] BIRTHDATE: [birth year] , 64  yrs. old GENDER: male ENDOSCOPIST: Eustace Quail, MD REFERRED BY:  Prince Solian, M.D. PROCEDURE DATE:  08/13/2015 PROCEDURE:  EGD w/ biopsy ASA CLASS:     Class II INDICATIONS:  nausea and hematemesis. MEDICATIONS: Monitored anesthesia care and Propofol 200 mg IV TOPICAL ANESTHETIC: none  DESCRIPTION OF PROCEDURE: After the risks benefits and alternatives of the procedure were thoroughly explained, informed consent was obtained.  The LB [id number] endoscope was introduced through the mouth and advanced to the second portion of the duodenum , Without limitations.  The instrument was slowly withdrawn as the mucosa was fully examined.  EXAM:Esophagus revealed a benign large caliber distal esophageal ring.  The stomach revealed multiple benign-appearing fundic gland type polyps.  No other gastric abnormalities.  The duodenal bulb was normal.  The post bulbar duodenum was remarkable for a 2 cm mass (submucosal) involving and inferior to the major ampullary orifice.  Mucosal biopsies taken.  Retroflexed views revealed a hiatal hernia.     The scope was then withdrawn from the patient and the procedure completed.  COMPLICATIONS: There were no immediate complications.  ENDOSCOPIC IMPRESSION: 1. GERD with dental large caliber distal esophageal ring or stricture 2. Multiple benign fundic gland type polyps 3. 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 Rule out mucosal lesion, versus submucosal lesion, versus choledochocele  RECOMMENDATIONS: 1.  Await biopsy results 2.  Continue current medications 3. May need additional imaging such as MRCP and/or EUS  REPEAT EXAM:  eSigned:  Eustace Quail, MD 08/13/2015 3:43 PM    CC:The Patient and Prince Solian, MD

## 2015-08-13 NOTE — Progress Notes (Signed)
A/ox3 pleased with MAC, report to Sheila RN 

## 2015-08-13 NOTE — Patient Instructions (Addendum)

## 2015-08-13 NOTE — Progress Notes (Signed)
Called to room to assist during endoscopic procedure.  Patient ID and intended procedure confirmed with present staff. Received instructions for my participation in the procedure from the performing physician.  

## 2015-08-13 NOTE — Op Note (Signed)
[place]  [address]. [place], [address]   COLONOSCOPY PROCEDURE REPORT  PATIENT: Samuel Jacobs, Samuel Jacobs  MR#: [id number] BIRTHDATE: 1950-07-22 , 64  yrs. old GENDER: male ENDOSCOPIST: Eustace Quail, MD REFERRED PY:[id number] Avva, M.D. PROCEDURE DATE:  08/13/2015 PROCEDURE:   Colonoscopy, surveillance and Colonoscopy with snare polypectomy X2 First Screening Colonoscopy - Avg.  risk and is 50 yrs.  old or older - No.  Prior Negative Screening - Now for repeat screening. N/A  History of Adenoma - Now for follow-up colonoscopy & has been > or = to 3 yrs.  Yes hx of adenoma.  Has been 3 or more years since last colonoscopy.  Polyps removed today? Yes ASA CLASS:   Class II INDICATIONS:Surveillance due to prior colonic neoplasia and PH Colon Adenoma. . Index examination June 2009 (Dr. Watt Climes) with  tubular adenomas MEDICATIONS: Monitored anesthesia care and Propofol 350 mg IV  DESCRIPTION OF PROCEDURE:   After the risks benefits and alternatives of the procedure were thoroughly explained, informed consent was obtained.  The digital rectal exam revealed no abnormalities of the rectum.   The [id number]  endoscope was introduced through the anus and advanced to the cecum, which was identified by both the appendix and ileocecal valve. No adverse events experienced.   The quality of the prep was excellent. (Suprep was used)  The instrument was then slowly withdrawn as the colon was fully examined. Estimated blood loss is zero unless otherwise noted in this procedure report.  COLON FINDINGS: Two polyps measuring 3 mm in size were found in the rectum.  A polypectomy was performed with a cold snare.  The resection was complete, the polyp tissue was completely retrieved and sent to histology.   There was moderate diverticulosis noted in the left colon.   The examination was otherwise normal. Retroflexed views revealed internal hemorrhoids. The time to cecum = 2.7  Withdrawal time = 15.4   The scope was withdrawn and the procedure completed. COMPLICATIONS: There were no immediate complications.  ENDOSCOPIC IMPRESSION: 1.   Two polyps were found in the rectum; polypectomy was performed with a cold snare 2.   Moderate diverticulosis was noted in the left colon 3.   The examination was otherwise normal  RECOMMENDATIONS: 1.  Repeat colonoscopy in 5 years 2.  Upper endoscopy today (please see report)  eSigned:  Eustace Quail, MD 08/13/2015 3:24 PM   cc: The Patient and Prince Solian, MD

## 2015-08-14 ENCOUNTER — Telehealth: Payer: Self-pay | Admitting: *Deleted

## 2015-08-14 NOTE — Telephone Encounter (Signed)
  Follow up Call-  Call back number 08/13/2015  Post procedure Call Back phone  # [phone] cell  Permission to leave phone message Yes    [facility]

## 2015-08-20 ENCOUNTER — Encounter: Payer: Self-pay | Admitting: Internal Medicine

## 2015-08-21 ENCOUNTER — Ambulatory Visit (INDEPENDENT_AMBULATORY_CARE_PROVIDER_SITE_OTHER): Payer: BLUE CROSS/BLUE SHIELD | Admitting: Neurology

## 2015-08-21 ENCOUNTER — Encounter: Payer: Self-pay | Admitting: Neurology

## 2015-08-21 VITALS — BP 127/75 | HR 60 | Ht 71.0 in | Wt 242.5 lb

## 2015-08-21 DIAGNOSIS — G4700 Insomnia, unspecified: Secondary | ICD-10-CM | POA: Insufficient documentation

## 2015-08-21 DIAGNOSIS — G40109 Localization-related (focal) (partial) symptomatic epilepsy and epileptic syndromes with simple partial seizures, not intractable, without status epilepticus: Secondary | ICD-10-CM

## 2015-08-21 MED ORDER — LACOSAMIDE 150 MG PO TABS: 150.0000 mg | ORAL_TABLET | Freq: Two times a day (BID) | ORAL | Status: DC

## 2015-08-21 MED ORDER — NORTRIPTYLINE HCL 25 MG PO CAPS: ORAL_CAPSULE | ORAL | Status: DC

## 2015-08-21 NOTE — Progress Notes (Signed)
Chief Complaint  Patient presents with  . Seizures    Patient is here for a f/u. He denies any recent seizures. He would like to discuss going back to 150 mg of Vimpat.      PATIENT: Samuel Jacobs DOB: [birth year]  REASON FOR VISIT: follow up- seizures HISTORY FROM: patient  HISTORY OF PRESENT ILLNESS: Samuel Jacobs is a 65 year old male with a history of complex partial seizure seizure  He continue have recurrent seizures while taking Depakote, eventually Vimpat was added on in 2015, Depakote was weaned off, while taking Vimpat 150 mg twice a day, he denies any seizure event.  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 He complains of dizziness, feeling funny with higher dose of Vimpat  Previous sleep study in 2011 showed mild obstructive sleep apnea, CPAP was not recommended at that time.   He has quit tramadol, he is now taking hydrocodone for low back pain. No longer has frequent headaches, was to go back on lower dose of Vimpat   Last seizure was in 2015, his seizure consistent of staring, confusion.  He has difficulty falling to sleep due to his shift job, tried over-the-counter Tylenol PM without benefit, he also complains of depression anxiety  REVIEW OF SYSTEMS: Out of a complete 14 system review of symptoms, the patient complains only of the following symptoms, and all other reviewed systems are negative.  eye itching, depression anxiety  ALLERGIES: Allergies  Allergen Reactions  . Sulfur     nausea    HOME MEDICATIONS: Outpatient Prescriptions Prior to Visit  Medication Sig Dispense Refill  . b complex vitamins tablet Take 1 tablet by  mouth daily.    . Cholecalciferol (VITAMIN D PO) Take 1 tablet by mouth daily.    . diclofenac sodium (VOLTAREN) 1 % GEL APPLY TO AFFECTED AREA JOINT 4 TIMES A DAY  3  . fluticasone (FLONASE) 50 MCG/ACT nasal spray Place 1 spray into both nostrils daily as needed for allergies. Reported on 08/13/2015    . glucosamine-chondroitin 500-400 MG tablet Take 1 tablet by mouth daily.    Marland Kitchen HYDROcodone-acetaminophen (NORCO/VICODIN) 5-325 MG tablet Take 1 tablet by mouth every 8 (eight) hours as needed.  0  . lacosamide (VIMPAT) 200 MG TABS tablet Take 1 tablet (200 mg total) by mouth 2 (two) times daily. 60 tablet 5  . Multiple Vitamins-Minerals (CENTRUM SILVER ADULT 50+) TABS Take 1 tablet by mouth daily.    . pantoprazole (PROTONIX) 40 MG tablet Take 40 mg by mouth 2 (two) times daily.     Marland Kitchen thiamine (VITAMIN B-1) 100 MG tablet Take 100 mg by mouth daily.    . chlorpheniramine-HYDROcodone (TUSSIONEX PENNKINETIC ER) 10-8 MG/5ML SUER Take 5 mLs by mouth 2 (two) times daily. (Patient not taking: Reported on 08/13/2015) 60 mL 0  . pseudoephedrine-guaifenesin (MUCINEX D) 60-600 MG 12 hr tablet Take 1 tablet by mouth every 12 (twelve) hours. (Patient not taking: Reported on 08/13/2015) 18 tablet 0  . sucralfate (CARAFATE) 1 GM/10ML suspension Take 10 mLs (1 g total) by mouth 4 (four) times daily -  with meals and at bedtime. (Patient not taking: Reported on 08/13/2015) 420 mL 0  No facility-administered medications prior to visit.    PAST MEDICAL HISTORY: Past Medical History  Diagnosis Date  . GERD (gastroesophageal reflux disease)   . Depression   . Anxiety   . Arthritis   . Colon polyps   . ED (erectile dysfunction)   . Carpal tunnel syndrome   . Insomnia   . HLD (hyperlipidemia)   . Allergic rhinitis   . Seizure ([place])     08-13-15 per pt his last seizure was "about 1 year ago".  maw  . Allergy   . Sleep apnea     pt does not wear c-pap    PAST SURGICAL HISTORY: Past Surgical History  Procedure  Laterality Date  . None    . Wisdom tooth extraction    . Colonoscopy    . Upper gastrointestinal endoscopy      FAMILY HISTORY: Family History  Problem Relation Age of Onset  . Breast cancer Mother     mets to liver  . Stomach cancer Father   . Epilepsy Sister   . Cancer Maternal Grandmother   . Cancer Maternal Grandfather   . Cancer Paternal Grandfather   . Cancer Paternal Grandmother   . Colon cancer Neg Hx   . Esophageal cancer Neg Hx   . Rectal cancer Neg Hx   . Prostate cancer Neg Hx     SOCIAL HISTORY: Social History   Social History  . Marital Status: Married    Spouse Name: Gailen Shelter  . Number of Children: 3  . Years of Education: college   Occupational History  . semi-retired    Social History Main Topics  . Smoking status: Never Smoker   . Smokeless tobacco: Never Used  . Alcohol Use: No  . Drug Use: No  . Sexual Activity: Not on file   Other Topics Concern  . Not on file   Social History Narrative   Patient lives at home with wife Samuel Jacobs.    Patient has retired and works part time at [employer]   Patient has 3 children.    Patient has a college education.   Patient is right handed.    PHYSICAL EXAM  Filed Vitals:   08/21/15 0732  BP: 127/75  Pulse: 60  Height: 5\' 11"  (1.803 m)  Weight: 242 lb 8 oz (109.997 kg)   Body mass index is 33.84 kg/(m^2).   PHYSICAL EXAMNIATION:  Gen: NAD, conversant, well nourised, obese, well groomed                     Cardiovascular: Regular rate rhythm, no peripheral edema, warm, nontender. Eyes: Conjunctivae clear without exudates or hemorrhage Neck: Supple, no carotid bruise. Pulmonary: Clear to auscultation bilaterally   NEUROLOGICAL EXAM:  MENTAL STATUS: Speech:    Speech is normal; fluent and spontaneous with normal comprehension.  Cognition:     Orientation to time, place and person     Normal recent and remote memory     Normal Attention span and concentration     Normal Language,  naming, repeating,spontaneous speech     Fund of knowledge   CRANIAL NERVES: CN II: Visual fields are full to confrontation. Fundoscopic exam is normal with sharp discs and no vascular changes. Pupils are round equal and briskly reactive to light. CN III, IV, VI: extraocular movement are normal. No ptosis. CN V: Facial sensation is intact to pinprick in all 3 divisions bilaterally. Corneal responses are intact.  CN VII: Face is symmetric with  normal eye closure and smile. CN VIII: Hearing is normal to rubbing fingers CN IX, X: Palate elevates symmetrically. Phonation is normal. CN XI: Head turning and shoulder shrug are intact CN XII: Tongue is midline with normal movements and no atrophy.  MOTOR: There is no pronator drift of out-stretched arms. Muscle bulk and tone are normal. Muscle strength is normal.  REFLEXES: Reflexes are 2+ and symmetric at the biceps, triceps, knees, and ankles. Plantar responses are flexor.  SENSORY: Intact to light touch, pinprick, position sense, and vibration sense are intact in fingers and toes.  COORDINATION: Rapid alternating movements and fine finger movements are intact. There is no dysmetria on finger-to-nose and heel-knee-shin.    GAIT/STANCE: Posture is normal. Gait is steady with normal steps, base, arm swing, and turning. Heel and toe walking are normal. Tandem gait is normal.  Romberg is absent.  DIAGNOSTIC DATA (LABS, IMAGING, TESTING) - I reviewed patient records, labs, notes, testing and imaging myself where available.   ASSESSMENT AND PLAN 65 y.o. year old male   Complex partial seizure  He complains of drowsiness with higher dose of Vimpat 200 mg twice a day, will decrease to 150 mg twice a day new prescription was provided  Last reported seizure was in 2015  Insomnia, shift work Depression anxiety  Add on low dose nortriptyline 25 mg, titrating to 50 mg every night  Marcial Pacas, M.D. Ph.D.  [facility] [place], [place] [address] Phone: [phone] Fax:      [phone]

## 2015-08-26 ENCOUNTER — Other Ambulatory Visit: Payer: Self-pay

## 2015-08-26 DIAGNOSIS — Q438 Other specified congenital malformations of intestine: Secondary | ICD-10-CM

## 2015-08-29 ENCOUNTER — Other Ambulatory Visit: Payer: Self-pay

## 2015-08-29 DIAGNOSIS — R1900 Intra-abdominal and pelvic swelling, mass and lump, unspecified site: Secondary | ICD-10-CM

## 2015-09-05 ENCOUNTER — Ambulatory Visit (HOSPITAL_COMMUNITY)
Admission: RE | Admit: 2015-09-05 | Discharge: 2015-09-05 | Disposition: A | Discharge status: Home / Self Care | Payer: BLUE CROSS/BLUE SHIELD | Source: Ambulatory Visit | Attending: Internal Medicine | Admitting: Internal Medicine

## 2015-09-05 ENCOUNTER — Ambulatory Visit (HOSPITAL_COMMUNITY): Payer: BLUE CROSS/BLUE SHIELD

## 2015-09-05 ENCOUNTER — Other Ambulatory Visit: Payer: Self-pay | Admitting: Internal Medicine

## 2015-09-05 DIAGNOSIS — R1900 Intra-abdominal and pelvic swelling, mass and lump, unspecified site: Secondary | ICD-10-CM

## 2015-09-05 LAB — POCT I-STAT CREATININE: Creatinine, Ser: 1.2 mg/dL (ref 0.61–1.24)

## 2015-09-05 MED ORDER — GADOBENATE DIMEGLUMINE 529 MG/ML IV SOLN
20.0000 mL | Freq: Once | INTRAVENOUS | Status: AC | PRN
  Administered 2015-09-05: 20 mL via INTRAVENOUS

## 2015-09-09 ENCOUNTER — Other Ambulatory Visit: Payer: Self-pay

## 2015-09-09 DIAGNOSIS — K319 Disease of stomach and duodenum, unspecified: Secondary | ICD-10-CM

## 2015-09-12 ENCOUNTER — Encounter (HOSPITAL_COMMUNITY): Payer: Self-pay | Admitting: *Deleted

## 2015-09-18 ENCOUNTER — Encounter (HOSPITAL_COMMUNITY): Payer: Self-pay | Admitting: *Deleted

## 2015-09-18 ENCOUNTER — Ambulatory Visit (HOSPITAL_COMMUNITY): Payer: BLUE CROSS/BLUE SHIELD | Admitting: Anesthesiology

## 2015-09-18 ENCOUNTER — Ambulatory Visit (HOSPITAL_COMMUNITY)
Admission: RE | Admit: 2015-09-18 | Discharge: 2015-09-18 | Disposition: A | Discharge status: Home / Self Care | Payer: BLUE CROSS/BLUE SHIELD | Source: Ambulatory Visit | Attending: Gastroenterology | Admitting: Gastroenterology

## 2015-09-18 ENCOUNTER — Encounter (HOSPITAL_COMMUNITY)
Admission: RE | Disposition: A | Discharge status: Home / Self Care | Payer: Self-pay | Source: Ambulatory Visit | Attending: Gastroenterology

## 2015-09-18 DIAGNOSIS — Z86010 Personal history of colonic polyps: Secondary | ICD-10-CM | POA: Diagnosis not present

## 2015-09-18 DIAGNOSIS — Z7951 Long term (current) use of inhaled steroids: Secondary | ICD-10-CM | POA: Insufficient documentation

## 2015-09-18 DIAGNOSIS — F418 Other specified anxiety disorders: Secondary | ICD-10-CM | POA: Diagnosis not present

## 2015-09-18 DIAGNOSIS — K3189 Other diseases of stomach and duodenum: Secondary | ICD-10-CM | POA: Insufficient documentation

## 2015-09-18 DIAGNOSIS — E669 Obesity, unspecified: Secondary | ICD-10-CM | POA: Diagnosis not present

## 2015-09-18 DIAGNOSIS — G4733 Obstructive sleep apnea (adult) (pediatric): Secondary | ICD-10-CM | POA: Diagnosis not present

## 2015-09-18 DIAGNOSIS — K219 Gastro-esophageal reflux disease without esophagitis: Secondary | ICD-10-CM | POA: Diagnosis not present

## 2015-09-18 DIAGNOSIS — G4700 Insomnia, unspecified: Secondary | ICD-10-CM | POA: Insufficient documentation

## 2015-09-18 DIAGNOSIS — M1990 Unspecified osteoarthritis, unspecified site: Secondary | ICD-10-CM | POA: Insufficient documentation

## 2015-09-18 DIAGNOSIS — K319 Disease of stomach and duodenum, unspecified: Secondary | ICD-10-CM

## 2015-09-18 DIAGNOSIS — Z79899 Other long term (current) drug therapy: Secondary | ICD-10-CM | POA: Diagnosis not present

## 2015-09-18 DIAGNOSIS — Z79891 Long term (current) use of opiate analgesic: Secondary | ICD-10-CM | POA: Diagnosis not present

## 2015-09-18 DIAGNOSIS — G40209 Localization-related (focal) (partial) symptomatic epilepsy and epileptic syndromes with complex partial seizures, not intractable, without status epilepticus: Secondary | ICD-10-CM | POA: Insufficient documentation

## 2015-09-18 DIAGNOSIS — Z6833 Body mass index (BMI) 33.0-33.9, adult: Secondary | ICD-10-CM | POA: Diagnosis not present

## 2015-09-18 HISTORY — DX: Other complications of anesthesia, initial encounter: T88.59XA

## 2015-09-18 HISTORY — PX: EUS: SHX5427

## 2015-09-18 HISTORY — DX: Adverse effect of unspecified anesthetic, initial encounter: T41.45XA

## 2015-09-18 SURGERY — UPPER ENDOSCOPIC ULTRASOUND (EUS) LINEAR
Anesthesia: Monitor Anesthesia Care

## 2015-09-18 MED ORDER — PROPOFOL 10 MG/ML IV BOLUS
INTRAVENOUS | Status: AC
  Filled 2015-09-18: qty 20

## 2015-09-18 MED ORDER — LIDOCAINE HCL (CARDIAC) 20 MG/ML IV SOLN
INTRAVENOUS | Status: AC
  Filled 2015-09-18: qty 5

## 2015-09-18 MED ORDER — LACTATED RINGERS IV SOLN
INTRAVENOUS | Status: DC
  Administered 2015-09-18: 1000 mL via INTRAVENOUS

## 2015-09-18 MED ORDER — PROPOFOL 500 MG/50ML IV EMUL
INTRAVENOUS | Status: DC | PRN
  Administered 2015-09-18: 100 ug/kg/min via INTRAVENOUS

## 2015-09-18 MED ORDER — SODIUM CHLORIDE 0.9 % IV SOLN: INTRAVENOUS | Status: DC

## 2015-09-18 MED ORDER — PROPOFOL 10 MG/ML IV BOLUS
INTRAVENOUS | Status: DC | PRN
  Administered 2015-09-18: 40 mg via INTRAVENOUS
  Administered 2015-09-18 (×3): 20 mg via INTRAVENOUS

## 2015-09-18 MED ORDER — LIDOCAINE HCL (CARDIAC) 20 MG/ML IV SOLN
INTRAVENOUS | Status: DC | PRN
  Administered 2015-09-18: 20 mg via INTRATRACHEAL

## 2015-09-18 NOTE — Discharge Instructions (Signed)

## 2015-09-18 NOTE — Interval H&P Note (Signed)
History and Physical Interval Note:  09/18/2015 11:38 AM  Samuel Jacobs  has presented today for surgery, with the diagnosis of abnormal duodenum  The various methods of treatment have been discussed with the patient and family. After consideration of risks, benefits and other options for treatment, the patient has consented to  Procedure(s): UPPER ENDOSCOPIC ULTRASOUND (EUS) LINEAR (N/A) as a surgical intervention .  The patient's history has been reviewed, patient examined, no change in status, stable for surgery.  I have reviewed the patient's chart and labs.  Questions were answered to the patient's satisfaction.     Milus Banister

## 2015-09-18 NOTE — H&P (View-Only) (Signed)
Chief Complaint  Patient presents with  . Seizures    Patient is here for a f/u. He denies any recent seizures. He would like to discuss going back to 150 mg of Vimpat.      PATIENT: Samuel Jacobs DOB: 14-Apr-1951  REASON FOR VISIT: follow up- seizures HISTORY FROM: patient  HISTORY OF PRESENT ILLNESS: Samuel Jacobs is a 65 year old male with a history of complex partial seizure seizure  He continue have recurrent seizures while taking Depakote, eventually Vimpat was added on in 2015, Depakote was weaned off, while taking Vimpat 150 mg twice a day, he denies any seizure event.  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 He complains of dizziness, feeling funny with higher dose of Vimpat  Previous sleep study in 2011 showed mild obstructive sleep apnea, CPAP was not recommended at that time.   He has quit tramadol, he is now taking hydrocodone for low back pain. No longer has frequent headaches, was to go back on lower dose of Vimpat   Last seizure was in 2015, his seizure consistent of staring, confusion.  He has difficulty falling to sleep due to his shift job, tried over-the-counter Tylenol PM without benefit, he also complains of depression anxiety  REVIEW OF SYSTEMS: Out of a complete 14 system review of symptoms, the patient complains only of the following symptoms, and all other reviewed systems are negative.  eye itching, depression anxiety  ALLERGIES: Allergies  Allergen Reactions  . Sulfur     nausea    HOME MEDICATIONS: Outpatient Prescriptions Prior to Visit  Medication Sig Dispense Refill  . b complex vitamins tablet Take 1 tablet by  mouth daily.    . Cholecalciferol (VITAMIN D PO) Take 1 tablet by mouth daily.    . diclofenac sodium (VOLTAREN) 1 % GEL APPLY TO AFFECTED AREA JOINT 4 TIMES A DAY  3  . fluticasone (FLONASE) 50 MCG/ACT nasal spray Place 1 spray into both nostrils daily as needed for allergies. Reported on 08/13/2015    . glucosamine-chondroitin 500-400 MG tablet Take 1 tablet by mouth daily.    Marland Kitchen HYDROcodone-acetaminophen (NORCO/VICODIN) 5-325 MG tablet Take 1 tablet by mouth every 8 (eight) hours as needed.  0  . lacosamide (VIMPAT) 200 MG TABS tablet Take 1 tablet (200 mg total) by mouth 2 (two) times daily. 60 tablet 5  . Multiple Vitamins-Minerals (CENTRUM SILVER ADULT 50+) TABS Take 1 tablet by mouth daily.    . pantoprazole (PROTONIX) 40 MG tablet Take 40 mg by mouth 2 (two) times daily.     Marland Kitchen thiamine (VITAMIN B-1) 100 MG tablet Take 100 mg by mouth daily.    . chlorpheniramine-HYDROcodone (TUSSIONEX PENNKINETIC ER) 10-8 MG/5ML SUER Take 5 mLs by mouth 2 (two) times daily. (Patient not taking: Reported on 08/13/2015) 60 mL 0  . pseudoephedrine-guaifenesin (MUCINEX D) 60-600 MG 12 hr tablet Take 1 tablet by mouth every 12 (twelve) hours. (Patient not taking: Reported on 08/13/2015) 18 tablet 0  . sucralfate (CARAFATE) 1 GM/10ML suspension Take 10 mLs (1 g total) by mouth 4 (four) times daily -  with meals and at bedtime. (Patient not taking: Reported on 08/13/2015) 420 mL 0  No facility-administered medications prior to visit.    PAST MEDICAL HISTORY: Past Medical History  Diagnosis Date  . GERD (gastroesophageal reflux disease)   . Depression   . Anxiety   . Arthritis   . Colon polyps   . ED (erectile dysfunction)   . Carpal tunnel syndrome   . Insomnia   . HLD (hyperlipidemia)   . Allergic rhinitis   . Seizure ([place])     08-13-15 per pt his last seizure was "about 1 year ago".  maw  . Allergy   . Sleep apnea     pt does not wear c-pap    PAST SURGICAL HISTORY: Past Surgical History  Procedure  Laterality Date  . None    . Wisdom tooth extraction    . Colonoscopy    . Upper gastrointestinal endoscopy      FAMILY HISTORY: Family History  Problem Relation Age of Onset  . Breast cancer Mother     mets to liver  . Stomach cancer Father   . Epilepsy Sister   . Cancer Maternal Grandmother   . Cancer Maternal Grandfather   . Cancer Paternal Grandfather   . Cancer Paternal Grandmother   . Colon cancer Neg Hx   . Esophageal cancer Neg Hx   . Rectal cancer Neg Hx   . Prostate cancer Neg Hx     SOCIAL HISTORY: Social History   Social History  . Marital Status: Married    Spouse Name: Gailen Shelter  . Number of Children: 3  . Years of Education: college   Occupational History  . semi-retired    Social History Main Topics  . Smoking status: Never Smoker   . Smokeless tobacco: Never Used  . Alcohol Use: No  . Drug Use: No  . Sexual Activity: Not on file   Other Topics Concern  . Not on file   Social History Narrative   Patient lives at home with wife Neoma Laming.    Patient has retired and works part time at [employer]   Patient has 3 children.    Patient has a college education.   Patient is right handed.    PHYSICAL EXAM  Filed Vitals:   08/21/15 0732  BP: 127/75  Pulse: 60  Height: 5\' 11"  (1.803 m)  Weight: 242 lb 8 oz (109.997 kg)   Body mass index is 33.84 kg/(m^2).   PHYSICAL EXAMNIATION:  Gen: NAD, conversant, well nourised, obese, well groomed                     Cardiovascular: Regular rate rhythm, no peripheral edema, warm, nontender. Eyes: Conjunctivae clear without exudates or hemorrhage Neck: Supple, no carotid bruise. Pulmonary: Clear to auscultation bilaterally   NEUROLOGICAL EXAM:  MENTAL STATUS: Speech:    Speech is normal; fluent and spontaneous with normal comprehension.  Cognition:     Orientation to time, place and person     Normal recent and remote memory     Normal Attention span and concentration     Normal Language,  naming, repeating,spontaneous speech     Fund of knowledge   CRANIAL NERVES: CN II: Visual fields are full to confrontation. Fundoscopic exam is normal with sharp discs and no vascular changes. Pupils are round equal and briskly reactive to light. CN III, IV, VI: extraocular movement are normal. No ptosis. CN V: Facial sensation is intact to pinprick in all 3 divisions bilaterally. Corneal responses are intact.  CN VII: Face is symmetric with  normal eye closure and smile. CN VIII: Hearing is normal to rubbing fingers CN IX, X: Palate elevates symmetrically. Phonation is normal. CN XI: Head turning and shoulder shrug are intact CN XII: Tongue is midline with normal movements and no atrophy.  MOTOR: There is no pronator drift of out-stretched arms. Muscle bulk and tone are normal. Muscle strength is normal.  REFLEXES: Reflexes are 2+ and symmetric at the biceps, triceps, knees, and ankles. Plantar responses are flexor.  SENSORY: Intact to light touch, pinprick, position sense, and vibration sense are intact in fingers and toes.  COORDINATION: Rapid alternating movements and fine finger movements are intact. There is no dysmetria on finger-to-nose and heel-knee-shin.    GAIT/STANCE: Posture is normal. Gait is steady with normal steps, base, arm swing, and turning. Heel and toe walking are normal. Tandem gait is normal.  Romberg is absent.  DIAGNOSTIC DATA (LABS, IMAGING, TESTING) - I reviewed patient records, labs, notes, testing and imaging myself where available.   ASSESSMENT AND PLAN 65 y.o. year old male   Complex partial seizure  He complains of drowsiness with higher dose of Vimpat 200 mg twice a day, will decrease to 150 mg twice a day new prescription was provided  Last reported seizure was in 2015  Insomnia, shift work Depression anxiety  Add on low dose nortriptyline 25 mg, titrating to 50 mg every night  Marcial Pacas, M.D. Ph.D.  [facility] [place], [place] [address] Phone: [phone] Fax:      [phone]

## 2015-09-18 NOTE — Anesthesia Preprocedure Evaluation (Signed)
Anesthesia Evaluation  Patient identified by MRN, date of birth, ID band Patient awake    Reviewed: Allergy & Precautions, NPO status , Patient's Chart, lab work & pertinent test results  Airway Mallampati: II  TM Distance: >3 FB Neck ROM: Full    Dental no notable dental hx.    Pulmonary sleep apnea ,    Pulmonary exam normal breath sounds clear to auscultation       Cardiovascular negative cardio ROS Normal cardiovascular exam Rhythm:Regular Rate:Normal     Neuro/Psych Seizures -, Well Controlled,  negative psych ROS   GI/Hepatic Neg liver ROS, GERD  Medicated,  Endo/Other  negative endocrine ROS  Renal/GU negative Renal ROS  negative genitourinary   Musculoskeletal negative musculoskeletal ROS (+)   Abdominal   Peds negative pediatric ROS (+)  Hematology negative hematology ROS (+)   Anesthesia Other Findings   Reproductive/Obstetrics negative OB ROS                             Anesthesia Physical Anesthesia Plan  ASA: III  Anesthesia Plan: MAC   Post-op Pain Management:    Induction: Intravenous  Airway Management Planned: Nasal Cannula  Additional Equipment:   Intra-op Plan:   Post-operative Plan: Extubation in OR  Informed Consent: I have reviewed the patients History and Physical, chart, labs and discussed the procedure including the risks, benefits and alternatives for the proposed anesthesia with the patient or authorized representative who has indicated his/her understanding and acceptance.   Dental advisory given  Plan Discussed with: CRNA and Surgeon  Anesthesia Plan Comments:         Anesthesia Quick Evaluation

## 2015-09-18 NOTE — Anesthesia Postprocedure Evaluation (Signed)
Anesthesia Post Note  Patient: Samuel Jacobs  Procedure(s) Performed: Procedure(s) (LRB): UPPER ENDOSCOPIC ULTRASOUND (EUS) LINEAR (N/A)  Patient location during evaluation: PACU Anesthesia Type: MAC Level of consciousness: awake and alert Pain management: pain level controlled Vital Signs Assessment: post-procedure vital signs reviewed and stable Respiratory status: spontaneous breathing, nonlabored ventilation, respiratory function stable and patient connected to nasal cannula oxygen Cardiovascular status: blood pressure returned to baseline and stable Postop Assessment: no signs of nausea or vomiting Anesthetic complications: no    Last Vitals:  Filed Vitals:   09/18/15 1310 09/18/15 1320  BP: 137/83 129/76  Pulse: 68 64  Temp:    Resp: 20 18    Last Pain: There were no vitals filed for this visit.               [name] S

## 2015-09-18 NOTE — Op Note (Signed)
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 Estimated Blood Loss:     Estimated blood loss: none. Procedure:                Pre-Anesthesia Assessment:                           - Prior to the procedure, a History and Physical                            was performed, and patient medications and                            allergies were reviewed. The patient's tolerance of                            previous anesthesia was also reviewed. The risks                            and benefits of the procedure and the sedation                            options and risks were discussed with the patient.                            All questions were answered, and informed consent                            was obtained. Prior Anticoagulants: The patient has                            taken no previous anticoagulant or antiplatelet                            agents. ASA Grade Assessment: II - A patient with                            mild systemic disease. After reviewing the risks  and benefits, the patient was deemed in                            satisfactory condition to undergo the procedure.                           After obtaining informed consent, the endoscope was                            passed under direct vision. Throughout the                            procedure, the patient's blood pressure,  pulse, and                            oxygen saturations were monitored continuously. The                            [id number]) scope was introduced through                            the mouth, and advanced to the second part of                            duodenum. The [id number]) scope was                            introduced through the mouth, and advanced to the                            second part of duodenum. The upper EUS was                            accomplished without difficulty. The patient                            tolerated the procedure well. Scope In: Scope Out: Findings:      Endoscopic findings:      1. Submucosal lesion just distal to the major papilla in the duodenum.       The lesion measures about 2-3cm across.      2. Otherwise normal UGI tract      EUS findings:      1. The periampullary mass noted above correlates with a hypoechic,       fairly round, solid mass that measures 2.1cm maximally and appears to       involve the muscularis propria layer of the duodenal wall. The mass was       sampled with 3 passes of a 25 gauge EUS FNA needle, suction.      2. CBD was normal, non-dilated      3. Pancreatic parenchyma was normal throughout      4. Main pancreatic duct was normal, non-dilated      5. There was no peripancreatic, paraduodenal adenopathy.      6. Limited views of the liver, spleen, portal and splenic vessels were  all normal. Impression:               Endoscopic findings:                           1. Submucosal lesion just distal to the major                            papilla in the duodenum. The lesion measures about                            2-3cm across.                           2. Otherwise normal UGI tract                           EUS findings:                           1. The periampullary mass noted above correlates                            with a hypoechic, fairly round, solid mass that                             measures 2.1cm maximally and appears to involve the                            muscularis propria layer of the duodenal wall. The                            mass was sampled with 3 passes of a 25 gauge EUS                            FNA needle, suction.                           2. CBD was normal, non-dilated                           3. Pancreatic parenchyma was normal throughout                           4. Main pancreatic duct was normal, non-dilated                           5. There was no peripancreatic, paraduodenal                            adenopathy.                           6. Limited views of the liver, spleen, portal and  splenic vessels were all normal. 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                           - This solid mass will likely need surgical                            resection. Procedure Code(s):        --- Professional ---                           [phone], Esophagogastroduodenoscopy, flexible,                            transoral; with endoscopic ultrasound examination                            limited to the esophagus, stomach or duodenum, and                            adjacent structures Diagnosis Code(s):        --- Professional ---                           K31.89, Other diseases of stomach and duodenum CPT copyright 2016 American Medical Association. All rights reserved. The codes documented in this report are preliminary and upon coder review may  be revised to meet current compliance requirements. Milus Banister, MD Milus Banister, MD 09/18/2015 12:45:13 PM Number of Addenda: 0

## 2015-09-18 NOTE — Transfer of Care (Signed)
Immediate Anesthesia Transfer of Care Note  Patient: Samuel Jacobs  Procedure(s) Performed: Procedure(s): UPPER ENDOSCOPIC ULTRASOUND (EUS) LINEAR (N/A)  Patient Location: PACU and Endoscopy Unit  Anesthesia Type:MAC  Level of Consciousness: sedated and patient cooperative  Airway & Oxygen Therapy: Patient Spontanous Breathing and Patient connected to nasal cannula oxygen  Post-op Assessment: Report given to RN and Post -op Vital signs reviewed and stable  Post vital signs: Reviewed and stable  Last Vitals:  Filed Vitals:   09/18/15 1039  BP: 174/92  Pulse: 84  Temp: 36.6 C  Resp: 12    Complications: No apparent anesthesia complications

## 2015-09-23 ENCOUNTER — Telehealth: Payer: Self-pay | Admitting: Adult Health

## 2015-09-23 NOTE — Telephone Encounter (Signed)
Patient is calling in regard to Rx nortriptyline and states that he has been taking the medication for about a month and that it is not helping him sleep at night.  Please call.

## 2015-09-23 NOTE — Telephone Encounter (Signed)
Patient states the medication is not helping him sleep at night. 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 He states he has not tried another medication. He started with 1 pill and increased to 2 pills. He has not seen any improvement on 2 pills for the last 3 weeks. He is wanting to try another medication.

## 2015-09-23 NOTE — Telephone Encounter (Signed)
He has been taking nortriptyline 50mg  qhs for three weeks.  Feels it is not helpful for his symptoms - still not able to sleep well.  He would like to know if it can be increased or try an alternate medication.

## 2015-09-23 NOTE — Telephone Encounter (Signed)
Per Dr. Krista Blue, start tapering down dose of nortriptyline - 25mg  per night x one week then stop.  Once he stops nortriptyline, Dr. Krista Blue will start him on trazodone 50mg  at bedtime. Left message for a return call from patient.

## 2015-09-24 ENCOUNTER — Encounter: Payer: Self-pay | Admitting: *Deleted

## 2015-09-24 ENCOUNTER — Encounter (HOSPITAL_COMMUNITY): Payer: Self-pay | Admitting: Gastroenterology

## 2015-09-24 MED ORDER — TRAZODONE HCL 50 MG PO TABS
50.0000 mg | ORAL_TABLET | Freq: Every day | ORAL | Status: DC
Start: 2015-09-24 — End: 2016-02-19

## 2015-09-24 NOTE — Addendum Note (Signed)
Addended by: Noberto Retort C on: 09/24/2015 10:39 AM   Modules accepted: Orders

## 2015-09-24 NOTE — Telephone Encounter (Signed)
Spoke to patient - he was agreeable and verbalized understanding of the plan below.

## 2015-09-25 ENCOUNTER — Telehealth: Payer: Self-pay | Admitting: Gastroenterology

## 2015-09-25 NOTE — Telephone Encounter (Signed)
Samuel Jacobs the pt called because Dr Ardis Hughs did and EUS per Dr Blanch Media recommendation.  He wanted to speak with Dr Henrene Pastor about the results and what the plan is going forward.  I see that you are setting up an appt with [facility].  Just an FYI he will have questions when you call with that info.

## 2015-09-25 NOTE — Telephone Encounter (Signed)
Spoke with pt and he is aware we are working on referral to Dr. Dossie Der at [facility]. Pts questions answered. Will call him back when have more information regarding appt at [facility].

## 2015-09-25 NOTE — Telephone Encounter (Signed)
Left message for pt to call back  °

## 2015-10-01 NOTE — Telephone Encounter (Signed)
Pt scheduled to see Dr. Dossie Der at [facility] 10/06/15.

## 2015-10-06 ENCOUNTER — Encounter: Payer: Self-pay | Admitting: Internal Medicine

## 2015-10-15 ENCOUNTER — Telehealth: Payer: Self-pay | Admitting: Internal Medicine

## 2015-10-15 NOTE — Telephone Encounter (Signed)
Pt states [facility] kept his pathology reports and he needs another copy for his insurance. Copies mailed to pt.

## 2015-11-07 HISTORY — PX: CHOLECYSTECTOMY: SHX55

## 2015-11-07 HISTORY — PX: TUMOR REMOVAL: SHX12

## 2015-11-17 ENCOUNTER — Telehealth: Payer: Self-pay | Admitting: Adult Health

## 2015-11-17 NOTE — Telephone Encounter (Signed)
I spoke to pt.  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    He is recovering at home now.  Has noted increase of jerking in arms and legs (this I see noted in 07-30-14).  He is on oxycodone 5-325mg  (2 tabs po every 4-6 hours prn, (trying to wean off), gabapentin 100mg  po tid and tylenol 650mg  2 tabs po tid.  I relayed that I will forward to Dr. Krista Blue, since noted in chart, but see for seizures.   He has had not seizures.

## 2015-11-17 NOTE — Telephone Encounter (Signed)
With his recent surgery, and the polypharmacy, it is not a good idea to adjust the medication on the phone, if he needed, may consider move up the follow-up appointment  Probably the best approach is to be patient with his symptoms, give it at least 6 weeks post surgically to see the progress.

## 2015-11-17 NOTE — Telephone Encounter (Signed)
I am ok with seeing the patient if Dr. Krista Blue does not have any availability.

## 2015-11-17 NOTE — Telephone Encounter (Signed)
Patient reports worsening of tremor that started within last week or two. Pt declined appt, sts he recovering from surgery. Advised that the nurse would call if there was any other questions.

## 2015-11-18 NOTE — Telephone Encounter (Signed)
I called pt and let him know below message from Dr. Krista Blue.  He was ok with this.  I told him to call us back and mm/NP could see him.  He was not able to come in right now due to home recovery after surgery.  He has his staples removed in around 1-2 wks.

## 2016-02-18 ENCOUNTER — Encounter: Payer: Self-pay | Admitting: *Deleted

## 2016-02-19 ENCOUNTER — Ambulatory Visit (INDEPENDENT_AMBULATORY_CARE_PROVIDER_SITE_OTHER): Payer: BLUE CROSS/BLUE SHIELD | Admitting: Adult Health

## 2016-02-19 ENCOUNTER — Encounter: Payer: Self-pay | Admitting: Adult Health

## 2016-02-19 VITALS — BP 126/77 | HR 58 | Ht 71.0 in | Wt 238.4 lb

## 2016-02-19 DIAGNOSIS — R569 Unspecified convulsions: Secondary | ICD-10-CM | POA: Diagnosis not present

## 2016-02-19 DIAGNOSIS — F329 Major depressive disorder, single episode, unspecified: Secondary | ICD-10-CM | POA: Diagnosis not present

## 2016-02-19 DIAGNOSIS — F32A Depression, unspecified: Secondary | ICD-10-CM

## 2016-02-19 DIAGNOSIS — G479 Sleep disorder, unspecified: Secondary | ICD-10-CM | POA: Diagnosis not present

## 2016-02-19 MED ORDER — TRAZODONE HCL 100 MG PO TABS: 100.0000 mg | ORAL_TABLET | Freq: Every day | ORAL | 5 refills | Status: DC

## 2016-02-19 NOTE — Progress Notes (Signed)
I agree with the assessment and plan as directed by NP .The patient is followed by Dr Yan   Shatora Weatherbee, MD  

## 2016-02-19 NOTE — Patient Instructions (Signed)
Increase Trazodone to 100 mg at bedtime Continue Vimpat  If your symptoms worsen or you develop new symptoms please let us know.

## 2016-02-19 NOTE — Progress Notes (Signed)
PATIENT: Samuel Jacobs DOB: May 21, 1951  REASON FOR VISIT: follow up- seizures HISTORY FROM: patient  HISTORY OF PRESENT ILLNESS:  HISTORY 08/21/15 (Per Dr. Krista Blue notes): Mr. Cefalo is a 65 year old male with a history of complex partial seizure seizure He continue have recurrent seizures while taking Depakote, eventually Vimpat was added on in 2015, Depakote was weaned off, while taking Vimpat 150 mg twice a day, he denies any seizure event. 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 He complains of dizziness, feeling funny with higher dose of Vimpat Previous sleep study in 2011 showed mild obstructive sleep apnea, CPAP was not recommended at that time.  He has quit tramadol, he is now taking hydrocodone for low back pain. No longer has frequent headaches, was to go back on lower dose of Vimpat  Last seizure was in 2015, his seizure consistent of staring, confusion. He has difficulty falling to sleep due to his shift job, tried over-the-counter Tylenol PM without benefit, he also complains of depression anxiety  Update 02/19/2016: Mr. Dougall is a 65 year old male with a history of seizures. He returns today for follow-up. He is currently taken Vimpat 150 mg twice a day. He denies any seizure events. At the last visit he was started on trazodone to help with sleep and depression. He reports that this is been beneficial for his sleep however he continues to have 2-3 episodes of depression a week. He states that when these episodes hit he tries to sleep them off. He denies any thoughts of harming himself or others. He is able to complete all ADLs independently.  Denies any changes with his gait or balance. He operates a Teacher, music without difficulty. Returns today for an evaluation.  REVIEW OF SYSTEMS: Out of a complete 14 system review of symptoms, the patient complains only of the following symptoms, and all other reviewed systems are negative.  Back pain, depression, nervous/anxious, environmental allergies, eye itching  ALLERGIES: Allergies  Allergen Reactions  . Sulfur Nausea Only    HOME MEDICATIONS: Outpatient Medications Prior to Visit  Medication Sig Dispense Refill  . acetaminophen (TYLENOL) 650 MG CR tablet Take 1,300 mg by mouth every 8 (eight) hours.    Marland Kitchen b complex vitamins tablet Take 1 tablet by mouth daily.    . Cholecalciferol (VITAMIN D PO) Take 1 tablet by mouth daily.    . fluticasone (FLONASE) 50 MCG/ACT nasal spray Place 1 spray into both nostrils daily as needed for allergies. Reported on 08/13/2015    . gabapentin (NEURONTIN) 100 MG capsule Take 100 mg by mouth 3 (three) times daily.    Marland Kitchen glucosamine-chondroitin 500-400 MG tablet Take 1 tablet by mouth daily.    Marland Kitchen HYDROcodone-acetaminophen (NORCO/VICODIN) 5-325 MG tablet Take 1 tablet by mouth every 8 (eight) hours as needed for moderate pain.   0  . lacosamide 150 MG TABS Take 1 tablet (150 mg total) by mouth 2 (two) times daily. 180 tablet 3  . Multiple Vitamins-Minerals (CENTRUM SILVER ADULT 50+) TABS Take 1 tablet by mouth daily.    . pantoprazole (PROTONIX) 40 MG tablet Take 40 mg by mouth 2 (two) times daily.     Marland Kitchen thiamine (  VITAMIN B-1) 100 MG tablet Take 100 mg by mouth daily.    . traZODone (DESYREL) 50 MG tablet Take 1 tablet (50 mg total) by mouth at bedtime. 30 tablet 5   No facility-administered medications prior to visit.     PAST MEDICAL HISTORY: Past Medical History:  Diagnosis Date  . Allergic rhinitis   . Allergy   . Anxiety   . Arthritis   . Carpal tunnel syndrome    09-12-15 no longer a problem.  . Colon polyps   . Complication of anesthesia     one time awaken- not with last procedure done.  . Depression   . ED (erectile dysfunction)   . GERD (gastroesophageal reflux disease)   . HLD (hyperlipidemia)   . Insomnia   . Seizure ([place])    08-13-15 per pt his last seizure was "about 1 year ago".  maw  . Sleep apnea    pt does not wear c-pap    PAST SURGICAL HISTORY: Past Surgical History:  Procedure Laterality Date  . CHOLECYSTECTOMY  11/07/2015  . COLONOSCOPY    . EUS N/A 09/18/2015   Procedure: UPPER ENDOSCOPIC ULTRASOUND (EUS) LINEAR;  Surgeon: Milus Banister, MD;  Location: [facility] END[facility];  Service: Endoscopy;  Laterality: N/A;  . TONSILLECTOMY    . 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  Marland Kitchen UPPER GASTROINTESTINAL ENDOSCOPY    . WISDOM TOOTH EXTRACTION      FAMILY HISTORY: Family History  Problem Relation Age of Onset  . Breast cancer Mother     mets to liver  . Stomach cancer Father   . Epilepsy Sister   . Cancer Maternal Grandmother   . Cancer Maternal Grandfather   . Cancer Paternal Grandfather   . Cancer Paternal Grandmother   . Colon cancer Neg Hx   . Esophageal cancer Neg Hx   . Rectal cancer Neg Hx   . Prostate cancer Neg Hx     SOCIAL HISTORY: Social History   Social History  . Marital status: Married    Spouse name: Gailen Shelter  . Number of children: 3  . Years of education: college   Occupational History  . semi-retired    Social History Main Topics  . Smoking status: Never Smoker  . Smokeless tobacco: Never Used  . Alcohol use No     Comment: past -none in 7-8 yrs social only  . Drug use: No  . Sexual activity: Not on file   Other Topics Concern  . Not on file   Social History Narrative   Patient lives at home with wife Neoma Laming.    Patient has retired and works part time at [employer]   Patient has 3 children.    Patient has a college education.   Patient is right handed.      PHYSICAL EXAM  Vitals:   02/19/16 0740  BP: 126/77  Pulse: (!) 58   Weight: 238 lb 6.4 oz (108.1 kg)  Height: 5\' 11"  (1.803 m)   Body mass index is 33.25 kg/m.  Generalized: Well developed, in no acute distress   Neurological examination  Mentation: Alert oriented to time, place, history taking. Follows all commands speech and language fluent Cranial nerve II-XII: Pupils were equal round reactive to light. Extraocular movements were full, visual field were full on confrontational test. Facial sensation and strength were normal. Uvula tongue midline. Head turning and shoulder shrug  were normal and symmetric. Motor: The motor testing reveals 5  over 5 strength of all 4 extremities. Good symmetric motor tone is noted throughout.  Sensory: Sensory testing is intact to soft touch on all 4 extremities. No evidence of extinction is noted.  Coordination: Cerebellar testing reveals good finger-nose-finger and heel-to-shin bilaterally.  Gait and station: Gait is normal. Tandem gait is normal. Romberg is negative. No drift is seen.  Reflexes: Deep tendon reflexes are symmetric and normal bilaterally.   DIAGNOSTIC DATA (LABS, IMAGING, TESTING) - I reviewed patient records, labs, notes, testing and imaging myself where available.  Lab Results  Component Value Date   WBC 10.5 04/15/2015   HGB 14.2 04/15/2015   HCT 40.1 04/15/2015   MCV 87.0 04/15/2015   PLT 269 04/15/2015      Component Value Date/Time   NA 138 04/15/2015 0658   NA 134 05/02/2014 1139   K 4.7 04/15/2015 0658   CL 103 04/15/2015 0658   CO2 27 04/15/2015 0658   GLUCOSE 150 (H) 04/15/2015 0658   BUN 18 04/15/2015 0658   BUN 16 05/02/2014 1139   CREATININE 1.20 09/05/2015 0822   CALCIUM 9.4 04/15/2015 0658   PROT 7.7 04/15/2015 0658   PROT 6.8 05/02/2014 1139   ALBUMIN 4.5 04/15/2015 0658   ALBUMIN 4.1 05/02/2014 1139   AST 29 04/15/2015 0658   ALT 31 04/15/2015 0658   ALKPHOS 61 04/15/2015 0658   BILITOT 0.3 04/15/2015 0658   GFRNONAA >60 04/15/2015 0658   GFRAA >60 04/15/2015 0658        ASSESSMENT AND PLAN 65 y.o. year old male  has a past medical history of Allergic rhinitis; Allergy; Anxiety; Arthritis; Carpal tunnel syndrome; Colon polyps; Complication of anesthesia; Depression; ED (erectile dysfunction); GERD (gastroesophageal reflux disease); HLD (hyperlipidemia); Insomnia; Seizure ([place]); and Sleep apnea. here with:  1. Seizures 2. Depression 3. Sleep disturbance  The patient will continue on Vimpat 150 mg twice a day. He has received good benefit with trazodone in regards to sleep but continues to have some depression. We will increase trazodone to 100 mg at bedtime. If this is not beneficial for his depression week we will need to consider another medication. Patient voiced understanding. He will follow-up in 6 months or sooner if needed     Ward Givens, MSN, NP-C 02/19/2016, 7:43 AM [facility] [address], [place], [place] [address] [phone]

## 2016-03-02 ENCOUNTER — Telehealth: Payer: Self-pay | Admitting: Adult Health

## 2016-03-02 MED ORDER — LACOSAMIDE 150 MG PO TABS: 150.0000 mg | ORAL_TABLET | Freq: Two times a day (BID) | ORAL | 3 refills | Status: DC

## 2016-03-02 NOTE — Telephone Encounter (Signed)
Pt requesting refill.  I called [facility] , they had perscription for 01-30-16 for 30 days, no prescription on hold (frrom 08-2015).

## 2016-03-02 NOTE — Telephone Encounter (Signed)
Pt request refill for vimpat sent to [facility]

## 2016-03-03 ENCOUNTER — Telehealth: Payer: Self-pay | Admitting: Neurology

## 2016-03-03 ENCOUNTER — Other Ambulatory Visit: Payer: Self-pay | Admitting: Neurology

## 2016-03-03 ENCOUNTER — Other Ambulatory Visit: Payer: Self-pay | Admitting: *Deleted

## 2016-03-03 MED ORDER — LACOSAMIDE 150 MG PO TABS: 150.0000 mg | ORAL_TABLET | Freq: Two times a day (BID) | ORAL | 1 refills | Status: DC

## 2016-03-03 NOTE — Telephone Encounter (Signed)
Fax confirmation to [facility] [address] [phone] received.

## 2016-03-03 NOTE — Telephone Encounter (Signed)
Pt received call from [facility], prescription ready.

## 2016-03-03 NOTE — Telephone Encounter (Signed)
Patient called regarding Lacosamide 150 MG TABS, [facility] [address] advised patient they have not received renewal for this Rx.

## 2016-03-03 NOTE — Telephone Encounter (Signed)
Pt was told by [facility] this medication was denied. He was advised it was denied today bc it was taken care of yesterday. Please call the pt to confirm RX was faxed

## 2016-03-10 ENCOUNTER — Telehealth: Payer: Self-pay | Admitting: *Deleted

## 2016-03-10 NOTE — Telephone Encounter (Signed)
Labs results received from [facility] (collected 02/10/16):  CMP: Glucose: 108 Creatinine: 1.0  CBC: HGB: 13.6  LIPID: Cholesterol: 167 LDL: 107  TSH: 1.11  PSA: 2.908  A1C: 6.0%

## 2016-03-25 ENCOUNTER — Telehealth: Payer: Self-pay | Admitting: Podiatry

## 2016-04-29 ENCOUNTER — Encounter: Payer: Self-pay | Admitting: Adult Health

## 2016-07-16 ENCOUNTER — Encounter: Payer: Self-pay | Admitting: Podiatry

## 2016-07-16 ENCOUNTER — Ambulatory Visit: Payer: BLUE CROSS/BLUE SHIELD

## 2016-07-16 ENCOUNTER — Ambulatory Visit (INDEPENDENT_AMBULATORY_CARE_PROVIDER_SITE_OTHER): Payer: BLUE CROSS/BLUE SHIELD | Admitting: Podiatry

## 2016-07-16 ENCOUNTER — Ambulatory Visit (INDEPENDENT_AMBULATORY_CARE_PROVIDER_SITE_OTHER): Payer: BLUE CROSS/BLUE SHIELD

## 2016-07-16 ENCOUNTER — Encounter: Payer: Self-pay | Admitting: Internal Medicine

## 2016-07-16 ENCOUNTER — Ambulatory Visit (INDEPENDENT_AMBULATORY_CARE_PROVIDER_SITE_OTHER): Payer: BLUE CROSS/BLUE SHIELD | Admitting: Internal Medicine

## 2016-07-16 VITALS — BP 163/90 | HR 75 | Resp 18

## 2016-07-16 VITALS — BP 140/80 | HR 84 | Ht 70.25 in | Wt 246.1 lb

## 2016-07-16 DIAGNOSIS — M722 Plantar fascial fibromatosis: Secondary | ICD-10-CM

## 2016-07-16 DIAGNOSIS — K219 Gastro-esophageal reflux disease without esophagitis: Secondary | ICD-10-CM

## 2016-07-16 DIAGNOSIS — L603 Nail dystrophy: Secondary | ICD-10-CM

## 2016-07-16 MED ORDER — MELOXICAM 15 MG PO TABS: 15.0000 mg | ORAL_TABLET | Freq: Every day | ORAL | 2 refills | Status: AC

## 2016-07-16 MED ORDER — RANITIDINE HCL 300 MG PO TABS: 300.0000 mg | ORAL_TABLET | Freq: Every day | ORAL | 11 refills | Status: DC

## 2016-07-16 MED ORDER — TRIAMCINOLONE ACETONIDE 10 MG/ML IJ SUSP
10.0000 mg | Freq: Once | INTRAMUSCULAR | Status: AC
  Administered 2016-07-16: 10 mg

## 2016-07-16 NOTE — Patient Instructions (Signed)

## 2016-07-16 NOTE — Progress Notes (Signed)
HISTORY OF PRESENT ILLNESS:  Samuel Jacobs is a 66 y.o. male with past medical history as listed below who presents today with a chief complaint of poorly controlled GERD. I saw the patient initially in December 2016 for chronic GERD, intermittent nausea with vomiting including minor hematemesis, and the need for surveillance colonoscopy. Previous GI care elsewhere. Colonoscopy was performed 08/13/2015. The patient was found to have diminutive hyperplastic polyps and moderate diverticulosis. Upper endoscopy revealed a benign large caliber distal esophageal ring and multiple benign fundic gland polyps. In addition submucosal mass at the ampulla. Overlying mucosa normal. Subsequently underwent endoscopic ultrasound. Found to have a 2.1 cm lesion which was biopsied. 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 He was sent to Dr. Dossie Der at [facility]. In late April 2016 he underwent exploratory laparotomy with duodenal tumor excision with lymph node sampling and incidental cholecystectomy. The pathology revealed benign paraganglioma. He recovered nicely. Subsequent saw his PCP for GERD refractory to twice daily pantoprazole. Now taking Nexium 40 mg twice daily. 30 minutes before breakfast and evening meal. Despite this he has significant nocturnal symptoms requiring antacids for relief. No dysphagia. He has had ongoing weight gain. Ideal weight about 220. No other complaints.  REVIEW OF SYSTEMS:  All non-GI ROS negative except for allergies, anxiety, arthritis, back pain, depression, insomnia, ankle swelling  Past Medical History:  Diagnosis Date  . Allergic rhinitis   . Allergy   . Anxiety   . Arthritis   . Carpal tunnel syndrome    09-12-15 no longer a problem.  . Colon polyps   . Complication of anesthesia    one time awaken- not with last procedure done.  . Depression   . ED (erectile dysfunction)   . GERD (gastroesophageal reflux disease)   . HLD (hyperlipidemia)   .  Insomnia   . Seizure (Samuel Jacobs)    08-13-15 per pt his last seizure was "about 1 year ago".  maw  . Sleep apnea    pt does not wear c-pap    Past Surgical History:  Procedure Laterality Date  . CHOLECYSTECTOMY  11/07/2015  . COLONOSCOPY    . EUS N/A 09/18/2015   Procedure: UPPER ENDOSCOPIC ULTRASOUND (EUS) LINEAR;  Surgeon: Milus Banister, MD;  Location: [facility];  Service: Endoscopy;  Laterality: N/A;  . TONSILLECTOMY    . 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  Marland Kitchen UPPER GASTROINTESTINAL ENDOSCOPY    . [place] EXTRACTION      Social History Samuel Jacobs  reports that he has never smoked. He has never used smokeless tobacco. He reports that he does not drink alcohol or use drugs.  family history includes Breast cancer in his mother; Cancer in his maternal grandfather, maternal grandmother, paternal grandfather, and paternal grandmother; Epilepsy in his sister; Stomach cancer in his father.  Allergies  Allergen Reactions  . Sulfur Nausea Only       PHYSICAL EXAMINATION: Vital signs: BP 140/80 (BP Location: Left Arm, Patient Position: Sitting, Cuff Size: Normal)   Pulse 84   Ht 5' 10.25" (1.784 m)   Wt 246 lb 2 oz (111.6 kg)   BMI 35.06 kg/m   Constitutional: generally well-appearing, no acute distress Psychiatric: alert and oriented x3, cooperative Eyes: extraocular movements intact, anicteric, conjunctiva pink Mouth: oral pharynx moist, no lesions Neck: supple no lymphadenopathy Cardiovascular: heart regular rate and rhythm, no murmur Lungs: clear to auscultation bilaterally Abdomen: soft, nontender, nondistended, no obvious ascites, no peritoneal signs,  normal bowel sounds, no organomegaly Rectal: Omitted Extremities: no clubbing cyanosis or lower extremity edema bilaterally Skin: no lesions on visible extremities Neuro: No focal deficits. Cranial nerves intact  ASSESSMENT:  #1. GERD. Refractory to twice a day PPI. Upper endoscopy last  year as described #2. Duodenal submucosal lesion status post resection at [facility]. Paraganglioma #3. Surveillance colonoscopy. Up-to-date. Non-adenomas last exam  PLAN:  #1. Reflux precautions. Particular elevation of the head of the bed and avoiding late night meals #2. Weight loss. The importance of this stressed #3. Continue twice a day PPI as he is taking #4. Add ranitidine 300 mg at night. Prescribed #5. Surveillance colonoscopy 2022 #6. Routine GI follow-up one year

## 2016-07-16 NOTE — Progress Notes (Signed)
Subjective: 66 year old male presents the office they for concerns of fungus was left big toe which is been ongoing about 6 months. He states his been yellow in color but he has noticed some smaller blood on the nail borders well. Denies any pain or swelling or any redness or drainage. He also states he has pain in both of his heels which is worse in the morning after getting a lot of walking. His been ongoing for several months. Denies any numbness or tingling. No swelling or redness. No recent injury or trauma. No recent treatment other than wearing over-the-counter inserts which are been helping. Denies any systemic complaints such as fevers, chills, nausea, vomiting. No acute changes since last appointment, and no other complaints at this time.   Objective: AAO x3, NAD DP/PT pulses palpable bilaterally, CRT less than 3 seconds Left hallux toenail. There is discolored with yellow discoloration. There is a small medical subungual hematoma present along the medial aspect of the nail border. There is no tenderness palpation. There is no surrounding redness or drainage or any pain.  Tenderness to palpation along the plantar medial tubercle of the calcaneus at the insertion of plantar fascia on the left and right foot. There is no pain along the course of the plantar fascia within the arch of the foot. Plantar fascia appears to be intact. There is no pain with lateral compression of the calcaneus or pain with vibratory sensation. There is no pain along the course or insertion of the achilles tendon. No other areas of tenderness to bilateral lower extremities. There is no pain with calf compression, swelling, warmth, erythema. There is no open lesions or pre-ulcer lesions identified bilaterally.  Assessment: Left hallux onychomycosis, bilateral heel pain likely plantar fasciitis  Plan: -Treatment options discussed including all alternatives, risks, and complications -Etiology of symptoms were  discussed -X-rays were obtained and reviewed with the patient. No evidence of acute fracture identified. -Patient elects to proceed with steroid injection into the left and right heel. Under sterile skin preparation, a total of 2.5cc of kenalog 10, 0.5% Marcaine plain, and 2% lidocaine plain were infiltrated into the symptomatic area without complication. A band-aid was applied. Patient tolerated the injection well without complication. Post-injection care with discussed with the patient. Discussed with the patient to ice the area over the next couple of days to help prevent a steroid flare.  Prescribed mobic. Discussed side effects of the medication and directed to stop if any are to occur and call the office.  -Continue stretching, icing exercises daily. Continue with orthotics. Discussed shoe gear changes as well. -Left hallux nails debrided this was sent for cultures/pathology to [facility] -Follow-up in 4 weeks or sooner if any problems arise. In the meantime, encouraged to call the office with any questions, concerns, change in symptoms.   Celesta Gentile, DPM

## 2016-07-16 NOTE — Patient Instructions (Signed)
We have sent the following medications to your pharmacy for you to pick up at your convenience: Ranitidine 300 mg every night  We have given you brochures regarding GERD. Please look over and follow those guidelines.  If you are age 66 or older, your body mass index should be between 23-30. Your Body mass index is 35.06 kg/m. If this is out of the aforementioned range listed, please consider follow up with your Primary Care Provider.  If you are age 19 or younger, your body mass index should be between 19-25. Your Body mass index is 35.06 kg/m. If this is out of the aformentioned range listed, please consider follow up with your Primary Care Provider.

## 2016-08-01 ENCOUNTER — Other Ambulatory Visit: Payer: Self-pay | Admitting: Adult Health

## 2016-08-13 ENCOUNTER — Encounter: Payer: Self-pay | Admitting: Podiatry

## 2016-08-13 ENCOUNTER — Ambulatory Visit (INDEPENDENT_AMBULATORY_CARE_PROVIDER_SITE_OTHER): Payer: BLUE CROSS/BLUE SHIELD | Admitting: Podiatry

## 2016-08-13 DIAGNOSIS — Z79899 Other long term (current) drug therapy: Secondary | ICD-10-CM

## 2016-08-13 DIAGNOSIS — M722 Plantar fascial fibromatosis: Secondary | ICD-10-CM | POA: Diagnosis not present

## 2016-08-13 DIAGNOSIS — B351 Tinea unguium: Secondary | ICD-10-CM | POA: Diagnosis not present

## 2016-08-13 MED ORDER — TERBINAFINE HCL 250 MG PO TABS: 250.0000 mg | ORAL_TABLET | Freq: Every day | ORAL | 0 refills | Status: DC

## 2016-08-13 NOTE — Patient Instructions (Addendum)
Terbinafine oral granules What is this medicine? TERBINAFINE (TER bin a feen) is an antifungal medicine. It is used to treat certain kinds of fungal or yeast infections. COMMON BRAND NAME(S): Lamisil What should I tell my health care provider before I take this medicine? They need to know if you have any of these conditions: -drink alcoholic beverages -kidney disease -liver disease -an unusual or allergic reaction to Terbinafine, other medicines, foods, dyes, or preservatives -pregnant or trying to get pregnant -breast-feeding How should I use this medicine? Take this medicine by mouth. Follow the directions on the prescription label. Hold packet with cut line on top. Shake packet gently to settle contents. Tear packet open along cut line, or use scissors to cut across line. Carefully pour the entire contents of packet onto a spoonful of a soft food, such as pudding or other soft, non-acidic food such as mashed potatoes (do NOT use applesauce or a fruit-based food). If two packets are required for each dose, you may either sprinkle the content of both packets on one spoonful of non-acidic food, or sprinkle the contents of both packets on two spoonfuls of non-acidic food. Make sure that no granules remain in the packet. Swallow the mxiture of the food and granules without chewing. Take your medicine at regular intervals. Do not take it more often than directed. Take all of your medicine as directed even if you think you are better. Do not skip doses or stop your medicine early. Contact your pediatrician or health care professional regarding the use of this medicine in children. While this medicine may be prescribed for children as young as 4 years for selected conditions, precautions do apply. What if I miss a dose? If you miss a dose, take it as soon as you can. If it is almost time for your next dose, take only that dose. Do not take double or extra doses. What may interact with this medicine? Do  not take this medicine with any of the following medications: -thioridazine This medicine may also interact with the following medications: -beta-blockers -caffeine -cimetidine -cyclosporine -MAOIs like Carbex, Eldepryl, Marplan, Nardil, and Parnate -medicines for fungal infections like fluconazole and ketoconazole -medicines for irregular heartbeat like amiodarone, flecainide and propafenone -rifampin -SSRIs like citalopram, escitalopram, fluoxetine, fluvoxamine, paroxetine and sertraline -tricyclic antidepressants like amitriptyline, clomipramine, desipramine, imipramine, nortriptyline, and others -warfarin What should I watch for while using this medicine? Your doctor may monitor your liver function. Tell your doctor right away if you have nausea or vomiting, loss of appetite, stomach pain on your right upper side, yellow skin, dark urine, light stools, or are over tired. You need to take this medicine for 6 weeks or longer to cure the fungal infection. Take your medicine regularly for as long as your doctor or health care professional tells you to. What side effects may I notice from receiving this medicine? Side effects that you should report to your doctor or health care professional as soon as possible: -allergic reactions like skin rash or hives, swelling of the face, lips, or tongue -change in vision -dark urine -fever or infection -general ill feeling or flu-like symptoms -light-colored stools -loss of appetite, nausea -redness, blistering, peeling or loosening of the skin, including inside the mouth -right upper belly pain -unusually weak or tired -yellowing of the eyes or skin Side effects that usually do not require medical attention (report to your doctor or health care professional if they continue or are bothersome): -changes in taste -diarrhea -hair loss -muscle  or joint pain -stomach upset Where should I keep my medicine? Keep out of the reach of  children. Store at room temperature between 15 and 30 degrees C (59 and 86 degrees F). Throw away any unused medicine after the expiration date.  2017 Elsevier/Gold Standard (2007-09-08 17:25:48)   Plantar Fasciitis Rehab Ask your health care provider which exercises are safe for you. Do exercises exactly as told by your health care provider and adjust them as directed. It is normal to feel mild stretching, pulling, tightness, or discomfort as you do these exercises, but you should stop right away if you feel sudden pain or your pain gets worse. Do not begin these exercises until told by your health care provider. Stretching and range of motion exercises These exercises warm up your muscles and joints and improve the movement and flexibility of your foot. These exercises also help to relieve pain. Exercise A: Plantar fascia stretch 1. Sit with your left / right leg crossed over your opposite knee. 2. Hold your heel with one hand with that thumb near your arch. With your other hand, hold your toes and gently pull them back toward the top of your foot. You should feel a stretch on the bottom of your toes or your foot or both. 3. Hold this stretch for__________ seconds. 4. Slowly release your toes and return to the starting position. Repeat __________ times. Complete this exercise __________ times a day. Exercise B: Gastroc, standing 1. Stand with your hands against a wall. 2. Extend your left / right leg behind you, and bend your front knee slightly. 3. Keeping your heels on the floor and keeping your back knee straight, shift your weight toward the wall without arching your back. You should feel a gentle stretch in your left / right calf. 4. Hold this position for __________ seconds. Repeat __________ times. Complete this exercise __________ times a day. Exercise C: Soleus, standing 1. Stand with your hands against a wall. 2. Extend your left / right leg behind you, and bend your front knee  slightly. 3. Keeping your heels on the floor, bend your back knee and slightly shift your weight over the back leg. You should feel a gentle stretch deep in your calf. 4. Hold this position for __________ seconds. Repeat __________ times. Complete this exercise __________ times a day. Exercise D: Gastrocsoleus, standing 1. Stand with the ball of your left / right foot on a step. The ball of your foot is on the walking surface, right under your toes. 2. Keep your other foot firmly on the same step. 3. Hold onto the wall or a railing for balance. 4. Slowly lift your other foot, allowing your body weight to press your heel down over the edge of the step. You should feel a stretch in your left / right calf. 5. Hold this position for __________ seconds. 6. Return both feet to the step. 7. Repeat this exercise with a slight bend in your left / right knee. Repeat __________ times with your left / right knee straight and __________ times with your left / right knee bent. Complete this exercise __________ times a day. Balance exercise This exercise builds your balance and strength control of your arch to help take pressure off your plantar fascia. Exercise E: Single leg stand 1. Without shoes, stand near a railing or in a doorway. You may hold onto the railing or door frame as needed. 2. Stand on your left / right foot. Keep your big toe down  on the floor and try to keep your arch lifted. Do not let your foot roll inward. 3. Hold this position for __________ seconds. 4. If this exercise is too easy, you can try it with your eyes closed or while standing on a pillow. Repeat __________ times. Complete this exercise __________ times a day. This information is not intended to replace advice given to you by your health care provider. Make sure you discuss any questions you have with your health care provider. Document Released: 06/28/2005 Document Revised: 03/02/2016 Document Reviewed: 05/12/2015 Elsevier  Interactive Patient Education  2017 [employer].

## 2016-08-16 NOTE — Progress Notes (Signed)
Subjective: 66 year old male presents the office they discussed nail culture results. He states his nails are unchanged. He states that after last appointment after the steroid injections to his heel his heels are doing significantly better knees having no pain in the heels. He denies any swelling to his feet denies any numbness or tingling. Denies any systemic complaints such as fevers, chills, nausea, vomiting. No acute changes since last appointment, and no other complaints at this time.   Objective: AAO x3, NAD DP/PT pulses palpable bilaterally, CRT less than 3 seconds Nails appear to be discolored, dystrophic, hypertrophic particularly left hallux toenail. There is no sign edema, erythema, increase in warmth or any drainage along the toenails.  There is no pain of the heels bilaterally there is no area of tenderness bilateral feet. There is no swelling. No open lesions or pre-ulcerative lesions.  No pain with calf compression, swelling, warmth, erythema  Assessment: Onychomycosis, resolving plantar fasciitis  Plan: -All treatment options discussed with the patient including all alternatives, risks, complications.  -Nail culture results were discussed the patient which revealed onychomycosis. After discussion of the treatment options scheduled to proceed with oral Lamisil. This was prescribed today as well as we will check blood work prior to starting the medication. He has not to start the medication toe I call him with the results of the blood work and he understood. -Continue stretching, icing daily that she is continue supportive shoe gear and orthotics. Monitor for recurrence. -Patient encouraged to call the office with any questions, concerns, change in symptoms.  -Follow up 6 weeks or sooner if needed.  Celesta Gentile, DPM

## 2016-08-23 ENCOUNTER — Ambulatory Visit (INDEPENDENT_AMBULATORY_CARE_PROVIDER_SITE_OTHER): Payer: BLUE CROSS/BLUE SHIELD | Admitting: Adult Health

## 2016-08-23 ENCOUNTER — Other Ambulatory Visit: Payer: Self-pay | Admitting: Podiatry

## 2016-08-23 ENCOUNTER — Encounter: Payer: Self-pay | Admitting: Adult Health

## 2016-08-23 VITALS — BP 128/78 | HR 73 | Ht 71.0 in | Wt 246.2 lb

## 2016-08-23 DIAGNOSIS — R251 Tremor, unspecified: Secondary | ICD-10-CM | POA: Diagnosis not present

## 2016-08-23 DIAGNOSIS — R569 Unspecified convulsions: Secondary | ICD-10-CM | POA: Diagnosis not present

## 2016-08-23 DIAGNOSIS — G4700 Insomnia, unspecified: Secondary | ICD-10-CM | POA: Diagnosis not present

## 2016-08-23 MED ORDER — LACOSAMIDE 150 MG PO TABS: 150.0000 mg | ORAL_TABLET | Freq: Two times a day (BID) | ORAL | 3 refills | Status: DC

## 2016-08-23 NOTE — Progress Notes (Signed)
Fax confirmation received.  Generic vimpat [facility] [id number]. sy

## 2016-08-23 NOTE — Progress Notes (Signed)
PATIENT: Samuel Jacobs DOB: 16-Jan-1951  REASON FOR VISIT: follow up HISTORY FROM: patient  HISTORY OF PRESENT ILLNESS: HISTORY 08/21/15 (Per Dr. Krista Blue notes): Mr. Samuel Jacobs is a 66 year old male with a history of complex partial seizure seizure He continue have recurrent seizures while taking Depakote, eventually Vimpat was added on in 2015, Depakote was weaned off, while taking Vimpat 150 mg twice a day, he denies any seizure event. 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 He complains of dizziness, feeling funny with higher dose of Vimpat Previous sleep study in 2011 showed mild obstructive sleep apnea, CPAP was not recommended at that time. He has quit tramadol, he is now taking hydrocodone for low back pain. No longer has frequent headaches, was to go back on lower dose of Vimpat  Last seizure was in 2015, his seizure consistent of staring, confusion. He has difficulty falling to sleep due to his shift job, tried over-the-counter Tylenol PM without benefit, he also complains of depression anxiety  Update 02/19/2016: Mr. Samuel Jacobs is a 66 year old male with a history of seizures. He returns today for follow-up. He is currently taken Vimpat 150 mg twice a day. He denies any seizure events. At the last visit he was started on trazodone to help with sleep and depression. He reports that this is been beneficial for his sleep however he continues to have 2-3 episodes of depression a week. He states that when these episodes hit he tries to sleep them off. He denies any thoughts of harming himself or others. He is able to complete all ADLs independently. Denies any  changes with his gait or balance. He operates a Teacher, music without difficulty. Returns today for an evaluation.  Today 08/23/2016:  Mr. Samuel Jacobs is a 66 year old male with a history of seizures. He returns today for follow-up. He remains on Vimpat 150 mg twice a day. He denies any seizures. He operates a Teacher, music without difficulty. The patient states that he has notices the last 3-4 months his balance has been slightly off. He denies any falls. He reports that since increasing trazodone 200 mg his sleep has improved. Also feels that his depression has improved. He states that he has had a sensation of nervousness here recently. He states that he has a tremor when he is trying to button shirts or pick up a screw. He states that time he feels like he is shaking on the inside. He has an appointment this morning with his primary care to discuss his anxiety.  REVIEW OF SYSTEMS: Out of a complete 14 system review of symptoms, the patient complains only of the following symptoms, and all other reviewed systems are negative.  Joint pain, back pain, walking difficulty, insomnia, tremors, dizziness, eye itching, fatigue  ALLERGIES: Allergies  Allergen Reactions  . Sulfur Nausea Only    HOME MEDICATIONS: Outpatient Medications Prior to Visit  Medication Sig Dispense Refill  . acetaminophen (TYLENOL) 650 MG CR tablet Take 1,300 mg by mouth every 8 (eight) hours.    Marland Kitchen b complex vitamins tablet Take 1 tablet by mouth daily.    . Cholecalciferol (VITAMIN D PO) Take 1 tablet by mouth daily.    Marland Kitchen  esomeprazole (NEXIUM) 40 MG capsule Take 1 capsule by mouth 2 (two) times daily.    . fluticasone (FLONASE) 50 MCG/ACT nasal spray Place 1 spray into both nostrils daily as needed for allergies. Reported on 08/13/2015    . glucosamine-chondroitin 500-400 MG tablet Take 1 tablet by mouth daily.    Marland Kitchen HYDROcodone-acetaminophen (NORCO/VICODIN) 5-325 MG tablet Take 1 tablet by mouth every 8 (eight) hours as needed for  moderate pain.   0  . Lacosamide 150 MG TABS Take 1 tablet (150 mg total) by mouth 2 (two) times daily. 180 tablet 1  . meloxicam (MOBIC) 15 MG tablet Take 1 tablet (15 mg total) by mouth daily. 30 tablet 2  . Multiple Vitamins-Minerals (CENTRUM SILVER ADULT 50+) TABS Take 1 tablet by mouth daily.    . ranitidine (ZANTAC) 300 MG tablet Take 1 tablet (300 mg total) by mouth at bedtime. 30 tablet 11  . terbinafine (LAMISIL) 250 MG tablet Take 1 tablet (250 mg total) by mouth daily. 90 tablet 0  . thiamine (VITAMIN B-1) 100 MG tablet Take 100 mg by mouth daily.    . traZODone (DESYREL) 100 MG tablet TAKE 1 TABLET (100 MG TOTAL) BY MOUTH AT BEDTIME. 30 tablet 5   No facility-administered medications prior to visit.     PAST MEDICAL HISTORY: Past Medical History:  Diagnosis Date  . Allergic rhinitis   . Allergy   . Anxiety   . Arthritis   . Carpal tunnel syndrome    09-12-15 no longer a problem.  . Colon polyps   . Complication of anesthesia    one time awaken- not with last procedure done.  . Depression   . ED (erectile dysfunction)   . GERD (gastroesophageal reflux disease)   . HLD (hyperlipidemia)   . Insomnia   . Seizure ([place])    08-13-15 per pt his last seizure was "about 1 year ago".  maw  . Sleep apnea    pt does not wear c-pap    PAST SURGICAL HISTORY: Past Surgical History:  Procedure Laterality Date  . CHOLECYSTECTOMY  11/07/2015  . COLONOSCOPY    . EUS N/A 09/18/2015   Procedure: UPPER ENDOSCOPIC ULTRASOUND (EUS) LINEAR;  Surgeon: Milus Banister, MD;  Location: [facility];  Service: Endoscopy;  Laterality: N/A;  . TONSILLECTOMY    . 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  Marland Kitchen UPPER GASTROINTESTINAL ENDOSCOPY    . WISDOM TOOTH EXTRACTION      FAMILY HISTORY: Family History  Problem Relation Age of Onset  . Breast cancer Mother     mets to liver  . Stomach cancer Father   . Epilepsy Sister   . Cancer Maternal Grandmother     type  unknown  . Cancer Maternal Grandfather     type unknown  . Cancer Paternal Grandfather     type unknown  . Cancer Paternal Grandmother     type unknown  . Colon cancer Neg Hx   . Esophageal cancer Neg Hx   . Rectal cancer Neg Hx   . Prostate cancer Neg Hx     SOCIAL HISTORY: Social History   Social History  . Marital status: Married    Spouse name: Gailen Shelter  . Number of children: 3  . Years of education: college   Occupational History  . semi-retired    Social History Main Topics  . Smoking status: Never Smoker  . Smokeless tobacco: Never Used  . Alcohol use No  Comment: past -none in 7-8 yrs social only  . Drug use: No  . Sexual activity: Not on file   Other Topics Concern  . Not on file   Social History Narrative   Patient lives at home with wife Neoma Laming.    Patient has retired and works part time at [employer]   Patient has 3 children.    Patient has a college education.   Patient is right handed.      PHYSICAL EXAM  Vitals:   08/23/16 0750  BP: 128/78  Pulse: 73  Weight: 246 lb 3.2 oz (111.7 kg)  Height: 5\' 11"  (1.803 m)   Body mass index is 34.34 kg/m.  Generalized: Well developed, in no acute distress   Neurological examination  Mentation: Alert oriented to time, place, history taking. Follows all commands speech and language fluent Cranial nerve II-XII: Pupils were equal round reactive to light. Extraocular movements were full, visual field were full on confrontational test. Facial sensation and strength were normal. Uvula tongue midline. Head turning and shoulder shrug  were normal and symmetric. Motor: The motor testing reveals 5 over 5 strength of all 4 extremities. Good symmetric motor tone is noted throughout.  Sensory: Sensory testing is intact to soft touch on all 4 extremities. No evidence of extinction is noted.  Coordination: Cerebellar testing reveals good finger-nose-finger and heel-to-shin bilaterally. Mild intention tremor  on the left. Gait and station: Gait is normal. Tandem gait is unsteady.. Romberg is negative. No drift is seen.  Reflexes: Deep tendon reflexes are symmetric and normal bilaterally.   DIAGNOSTIC DATA (LABS, IMAGING, TESTING) - I reviewed patient records, labs, notes, testing and imaging myself where available.  Lab Results  Component Value Date   WBC 10.5 04/15/2015   HGB 14.2 04/15/2015   HCT 40.1 04/15/2015   MCV 87.0 04/15/2015   PLT 269 04/15/2015      Component Value Date/Time   NA 138 04/15/2015 0658   NA 134 05/02/2014 1139   K 4.7 04/15/2015 0658   CL 103 04/15/2015 0658   CO2 27 04/15/2015 0658   GLUCOSE 150 (H) 04/15/2015 0658   BUN 18 04/15/2015 0658   BUN 16 05/02/2014 1139   CREATININE 1.20 09/05/2015 0822   CALCIUM 9.4 04/15/2015 0658   PROT 7.7 04/15/2015 0658   PROT 6.8 05/02/2014 1139   ALBUMIN 4.5 04/15/2015 0658   ALBUMIN 4.1 05/02/2014 1139   AST 29 04/15/2015 0658   ALT 31 04/15/2015 0658   ALKPHOS 61 04/15/2015 0658   BILITOT 0.3 04/15/2015 0658   GFRNONAA >60 04/15/2015 0658   GFRAA >60 04/15/2015 0658      ASSESSMENT AND PLAN 66 y.o. year old male  has a past medical history of Allergic rhinitis; Allergy; Anxiety; Arthritis; Carpal tunnel syndrome; Colon polyps; Complication of anesthesia; Depression; ED (erectile dysfunction); GERD (gastroesophageal reflux disease); HLD (hyperlipidemia); Insomnia; Seizure ([place]); and Sleep apnea. here with:  1. Seizures 2. Tremor 3. Insomnia  The patient will continue on Vimpat 150 mg twice a day. He will remain on trazodone 100 mg at bedtime for insomnia. The patient has a mild intention tremor on the left. For now we will continue to monitor. Patient advised that if his tremor worsens he should let us know. She also advised that if his gait or balance worsens he should let us know. He will follow-up in 6 months with Dr. Krista Blue.     Lindell Spar tremor in the [place], MSN, NP-C 08/23/2016, 8:08 AM [facility] [address], [place], [place] [address] [phone]

## 2016-08-23 NOTE — Progress Notes (Signed)
I have reviewed and agreed above plan. 

## 2016-08-23 NOTE — Patient Instructions (Signed)
Continue Vimpat and Trazodone If you have any seizure events please let us know.

## 2016-08-24 LAB — CBC WITH DIFFERENTIAL/PLATELET
Basophils Absolute: 0 10*3/uL (ref 0.0–0.2)
Basos: 0 %
EOS (ABSOLUTE): 0.2 10*3/uL (ref 0.0–0.4)
EOS: 3 %
HEMOGLOBIN: 14.4 g/dL (ref 13.0–17.7)
Hematocrit: 42.6 % (ref 37.5–51.0)
Immature Grans (Abs): 0 10*3/uL (ref 0.0–0.1)
Immature Granulocytes: 0 %
LYMPHS ABS: 2.2 10*3/uL (ref 0.7–3.1)
Lymphs: 29 %
MCH: 28.9 pg (ref 26.6–33.0)
MCHC: 33.8 g/dL (ref 31.5–35.7)
MCV: 86 fL (ref 79–97)
MONOCYTES: 14 %
MONOS ABS: 1.1 10*3/uL — AB (ref 0.1–0.9)
NEUTROS ABS: 4.1 10*3/uL (ref 1.4–7.0)
Neutrophils: 54 %
Platelets: 295 10*3/uL (ref 150–379)
RBC: 4.98 x10E6/uL (ref 4.14–5.80)
RDW: 15.6 % — AB (ref 12.3–15.4)
WBC: 7.7 10*3/uL (ref 3.4–10.8)

## 2016-08-24 LAB — HEPATIC FUNCTION PANEL
ALT: 25 IU/L (ref 0–44)
AST: 24 IU/L (ref 0–40)
Albumin: 4.4 g/dL (ref 3.6–4.8)
Alkaline Phosphatase: 63 IU/L (ref 39–117)
BILIRUBIN, DIRECT: 0.1 mg/dL (ref 0.00–0.40)
Bilirubin Total: 0.3 mg/dL (ref 0.0–1.2)
TOTAL PROTEIN: 7.3 g/dL (ref 6.0–8.5)

## 2016-08-25 ENCOUNTER — Telehealth: Payer: Self-pay | Admitting: *Deleted

## 2016-08-25 NOTE — Telephone Encounter (Addendum)
-----   Message from Trula Slade, DPM sent at 08/25/2016  2:56 PM EST ----- Can start lamisil. Please let him know. Left message informing pt of Dr. Leigh Aurora review of his blood work and that her could begin his medication as directed and to call with concerns.

## 2016-08-31 ENCOUNTER — Other Ambulatory Visit: Payer: Self-pay | Admitting: Neurology

## 2016-09-23 ENCOUNTER — Encounter: Payer: Self-pay | Admitting: Podiatry

## 2016-09-23 ENCOUNTER — Ambulatory Visit (INDEPENDENT_AMBULATORY_CARE_PROVIDER_SITE_OTHER): Payer: BLUE CROSS/BLUE SHIELD | Admitting: Podiatry

## 2016-09-23 DIAGNOSIS — B351 Tinea unguium: Secondary | ICD-10-CM

## 2016-09-23 DIAGNOSIS — Z79899 Other long term (current) drug therapy: Secondary | ICD-10-CM | POA: Diagnosis not present

## 2016-09-23 NOTE — Patient Instructions (Signed)

## 2016-09-28 NOTE — Progress Notes (Signed)
Subjective: 66 year old male presents the office they for follow-up evaluation of toenail fungus. He is been taking the Lamisil and he said no side effects the medication. He states he has not noticed significant improvement toenails this point. He denies any drainage or redness or any pus or any signs of infection around the toenails. Denies any systemic complaints such as fevers, chills, nausea, vomiting. No acute changes since last appointment, and no other complaints at this time.   Objective: AAO x3, NAD DP/PT pulses palpable bilaterally, CRT less than 3 seconds No skin TB dystrophic, discolored, hypertrophic mostly on the left hallux toenail. There is no swelling redness, drainage or any signs of infection. No open lesions or pre-ulcerative lesions.  No pain with calf compression, swelling, warmth, erythema  Assessment: Onychomycosis  Plan: -All treatment options discussed with the patient including all alternatives, risks, complications.  -Currently he denies any side effects the medication. Continue Lamisil. We will recheck CBC, liver function tests. This is ordered today. -RTC as scheduled or sooner if needed. -Patient encouraged to call the office with any questions, concerns, change in symptoms.   Celesta Gentile, DPM

## 2016-10-06 ENCOUNTER — Telehealth: Payer: Self-pay | Admitting: *Deleted

## 2016-10-06 ENCOUNTER — Other Ambulatory Visit: Payer: Self-pay | Admitting: *Deleted

## 2016-10-06 MED ORDER — MELOXICAM 15 MG PO TABS: 15.0000 mg | ORAL_TABLET | Freq: Every day | ORAL | 0 refills | Status: DC

## 2016-10-06 NOTE — Telephone Encounter (Signed)
[facility] faxed over a request for a refill and refilled the mobic 30 tablets with no refills. Lattie Haw

## 2016-11-08 ENCOUNTER — Telehealth: Payer: Self-pay | Admitting: *Deleted

## 2016-11-08 NOTE — Telephone Encounter (Signed)
Received fax requesting refill of Terbinafine. Pt has received #90 Terbinafine, therapeutic dosing, refill denied. Return fax denying.

## 2016-11-10 ENCOUNTER — Other Ambulatory Visit: Payer: Self-pay | Admitting: Podiatry

## 2016-11-10 NOTE — Telephone Encounter (Signed)
Pt needs an appt prior to future refills, if continuing to have pain.

## 2016-11-15 ENCOUNTER — Telehealth: Payer: Self-pay | Admitting: Podiatry

## 2016-11-15 NOTE — Telephone Encounter (Signed)
He should have 90 days of the medication already. If there is not improvement the I need to see him. If it is looking better but still no completely resolved lets do only 30 days more for a total of 4 months.

## 2016-11-15 NOTE — Telephone Encounter (Signed)
Pt. Needs RX refilled (Lamisil).

## 2016-11-16 NOTE — Telephone Encounter (Signed)
Left message informing pt that he had #90 days of the medication and if he was not improving to make an appt to see Dr. Jacqualyn Posey.

## 2016-12-09 ENCOUNTER — Other Ambulatory Visit: Payer: Self-pay | Admitting: Podiatry

## 2016-12-09 NOTE — Telephone Encounter (Signed)
Pt needs an appt prior to future refills. 

## 2017-01-11 ENCOUNTER — Other Ambulatory Visit: Payer: Self-pay | Admitting: Podiatry

## 2017-01-23 ENCOUNTER — Other Ambulatory Visit: Payer: Self-pay | Admitting: Adult Health

## 2017-02-21 ENCOUNTER — Encounter: Payer: Self-pay | Admitting: Neurology

## 2017-02-21 ENCOUNTER — Ambulatory Visit (INDEPENDENT_AMBULATORY_CARE_PROVIDER_SITE_OTHER): Payer: BLUE CROSS/BLUE SHIELD | Admitting: Neurology

## 2017-02-21 VITALS — BP 153/88 | HR 60 | Ht 71.0 in | Wt 243.0 lb

## 2017-02-21 DIAGNOSIS — G4700 Insomnia, unspecified: Secondary | ICD-10-CM | POA: Diagnosis not present

## 2017-02-21 DIAGNOSIS — G40109 Localization-related (focal) (partial) symptomatic epilepsy and epileptic syndromes with simple partial seizures, not intractable, without status epilepticus: Secondary | ICD-10-CM | POA: Diagnosis not present

## 2017-02-21 MED ORDER — LACOSAMIDE 150 MG PO TABS: 150.0000 mg | ORAL_TABLET | Freq: Two times a day (BID) | ORAL | 4 refills | Status: DC

## 2017-02-21 NOTE — Progress Notes (Signed)
PATIENT: Samuel Jacobs DOB: 05-12-1951  REASON FOR VISIT: follow up HISTORY FROM: patient  HISTORY OF PRESENT ILLNESS: HISTORY 08/21/15 (Per Dr. Krista Blue notes): Mr. Hartsell is a 66 year old male with a history of complex partial seizure seizure He continue have recurrent seizures while taking Depakote, eventually Vimpat was added on in 2015, Depakote was weaned off, while taking Vimpat 150 mg twice a day, he denies any seizure event. 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 He complains of dizziness, feeling funny with higher dose of Vimpat Previous sleep study in 2011 showed mild obstructive sleep apnea, CPAP was not recommended at that time. He has quit tramadol, he is now taking hydrocodone for low back pain. No longer has frequent headaches, was to go back on lower dose of Vimpat  Last seizure was in 2015, his seizure consistent of staring, confusion. He has difficulty falling to sleep due to his shift job, tried over-the-counter Tylenol PM without benefit, he also complains of depression anxiety  Update February 21 2017: He is taking Vimpat 150 twice a day, there was no recurrent seizure activity, he is driving, trazodone 458 mg for sleep,  REVIEW OF SYSTEMS: Out of a complete 14 system review of symptoms, the patient complains only of the following symptoms, and all other reviewed systems are negative. Eye  itching, back pain, tremor  ALLERGIES: Allergies  Allergen Reactions  . Sulfur Nausea Only    HOME MEDICATIONS: Outpatient Medications Prior to Visit  Medication Sig Dispense Refill  . acetaminophen (TYLENOL) 650 MG CR tablet Take 1,300 mg  by mouth every 8 (eight) hours.    Marland Kitchen b complex vitamins tablet Take 1 tablet by mouth daily.    . Cholecalciferol (VITAMIN D PO) Take 1 tablet by mouth daily.    Marland Kitchen esomeprazole (NEXIUM) 40 MG capsule Take 1 capsule by mouth 2 (two) times daily.    . fluticasone (FLONASE) 50 MCG/ACT nasal spray Place 1 spray into both nostrils daily as needed for allergies. Reported on 08/13/2015    . glucosamine-chondroitin 500-400 MG tablet Take 1 tablet by mouth daily.    Marland Kitchen HYDROcodone-acetaminophen (NORCO/VICODIN) 5-325 MG tablet Take 1 tablet by mouth every 8 (eight) hours as needed for moderate pain.   0  . Lacosamide 150 MG TABS Take 1 tablet (150 mg total) by mouth 2 (two) times daily. 180 tablet 3  . meloxicam (MOBIC) 15 MG tablet Take 1 tablet (15 mg total) by mouth daily. 30 tablet 2  . meloxicam (MOBIC) 15 MG tablet TAKE 1 TABLET BY MOUTH EVERY DAY 30 tablet 0  . Multiple Vitamins-Minerals (CENTRUM SILVER ADULT 50+) TABS Take 1 tablet by mouth daily.    . ranitidine (ZANTAC) 300 MG tablet Take 1 tablet (300 mg total) by mouth at bedtime. 30 tablet 11  . terbinafine (LAMISIL) 250 MG tablet Take 1 tablet (250 mg total) by mouth daily. 90 tablet 0  . thiamine (VITAMIN B-1) 100 MG tablet Take 100 mg by mouth daily.    . traZODone (DESYREL) 100 MG tablet TAKE 1 TABLET (100 MG TOTAL) BY MOUTH AT BEDTIME. 30 tablet 5   No facility-administered medications prior to visit.  PAST MEDICAL HISTORY: Past Medical History:  Diagnosis Date  . Allergic rhinitis   . Allergy   . Anxiety   . Arthritis   . Carpal tunnel syndrome    09-12-15 no longer a problem.  . Colon polyps   . Complication of anesthesia    one time awaken- not with last procedure done.  . Depression   . ED (erectile dysfunction)   . GERD (gastroesophageal reflux disease)   . HLD (hyperlipidemia)   . Insomnia   . Seizure ([place])    08-13-15 per pt his last seizure was "about 1 year ago".  maw  . Sleep apnea    pt does not wear c-pap     PAST SURGICAL HISTORY: Past Surgical History:  Procedure Laterality Date  . CHOLECYSTECTOMY  11/07/2015  . COLONOSCOPY    . EUS N/A 09/18/2015   Procedure: UPPER ENDOSCOPIC ULTRASOUND (EUS) LINEAR;  Surgeon: Milus Banister, MD;  Location: [facility];  Service: Endoscopy;  Laterality: N/A;  . TONSILLECTOMY    . 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  Marland Kitchen UPPER GASTROINTESTINAL ENDOSCOPY    . WISDOM TOOTH EXTRACTION      FAMILY HISTORY: Family History  Problem Relation Age of Onset  . Breast cancer Mother        mets to liver  . Stomach cancer Father   . Epilepsy Sister   . Cancer Maternal Grandmother        type unknown  . Cancer Maternal Grandfather        type unknown  . Cancer Paternal Grandfather        type unknown  . Cancer Paternal Grandmother        type unknown  . Colon cancer Neg Hx   . Esophageal cancer Neg Hx   . Rectal cancer Neg Hx   . Prostate cancer Neg Hx     SOCIAL HISTORY: Social History   Social History  . Marital status: Married    Spouse name: Gailen Shelter  . Number of children: 3  . Years of education: college   Occupational History  . semi-retired    Social History Main Topics  . Smoking status: Never Smoker  . Smokeless tobacco: Never Used  . Alcohol use No     Comment: past -none in 7-8 yrs social only  . Drug use: No  . Sexual activity: Not on file   Other Topics Concern  . Not on file   Social History Narrative   Patient lives at home with wife Neoma Laming.    Patient has retired and works part time at [employer]   Patient has 3 children.    Patient has a college education.   Patient is right handed.      PHYSICAL EXAM  Vitals:   02/21/17 1602  BP: (!) 153/88  Pulse: 60  Weight: 243 lb (110.2 kg)  Height: 5\' 11"  (1.803 m)   Body mass index is 33.89 kg/m.  Generalized: Well developed, in no acute distress   Neurological examination  Mentation: Alert oriented to time, place,  history taking. Follows all commands speech and language fluent Cranial nerve II-XII: Pupils were equal round reactive to light. Extraocular movements were full, visual field were full on confrontational test. Facial sensation and strength were normal. Uvula tongue midline. Head turning and shoulder shrug  were normal and symmetric. Motor: The motor testing reveals 5 over 5 strength of all 4 extremities. Good symmetric motor tone is  noted throughout.  Sensory: Sensory testing is intact to soft touch on all 4 extremities. No evidence of extinction is noted.  Coordination: Cerebellar testing reveals good finger-nose-finger and heel-to-shin bilaterally. Mild intention tremor on the left. Gait and station: Gait is normal. Tandem gait is unsteady.. Romberg is negative. No drift is seen.  Reflexes: Deep tendon reflexes are symmetric and normal bilaterally.   DIAGNOSTIC DATA (LABS, IMAGING, TESTING) - I reviewed patient records, labs, notes, testing and imaging myself where available.  Lab Results  Component Value Date   WBC 7.7 08/23/2016   HGB 14.4 08/23/2016   HCT 42.6 08/23/2016   MCV 86 08/23/2016   PLT 295 08/23/2016      Component Value Date/Time   NA 138 04/15/2015 0658   NA 134 05/02/2014 1139   K 4.7 04/15/2015 0658   CL 103 04/15/2015 0658   CO2 27 04/15/2015 0658   GLUCOSE 150 (H) 04/15/2015 0658   BUN 18 04/15/2015 0658   BUN 16 05/02/2014 1139   CREATININE 1.20 09/05/2015 0822   CALCIUM 9.4 04/15/2015 0658   PROT 7.3 08/23/2016 0836   ALBUMIN 4.4 08/23/2016 0836   AST 24 08/23/2016 0836   ALT 25 08/23/2016 0836   ALKPHOS 63 08/23/2016 0836   BILITOT 0.3 08/23/2016 0836   GFRNONAA >60 04/15/2015 0658   GFRAA >60 04/15/2015 0658      ASSESSMENT AND PLAN 66 y.o. year old male   Epilepsy:  Last seizure was in 2015.  Vimpat 150mg  bid   Chronic Insomnia:  Refill trazadone  Marcial Pacas, M.D. Ph.D.  [facility] [place], [place]  [address] Phone: [phone] Fax:      [phone]  CC: Prince Solian, MD

## 2017-05-03 IMAGING — MR MR 3D RECON AT [facility]
19 of 23 series · 19 of 23 positions shown · IV contrast (multihance)
Comparison: None.

CLINICAL DATA: Ampullary mass seen on recent endoscopy. Evaluate
for ampullary neoplasm versus: Lymphocele.

EXAM:
MRI ABDOMEN WITHOUT AND WITH CONTRAST (INCLUDING MRCP)
TECHNIQUE: Multiplanar multisequence MR imaging of the abdomen was performed
both before and after the administration of intravenous contrast.
Heavily T2-weighted images of the biliary and pancreatic ducts were
obtained, and three-dimensional MRCP images were rendered by post
processing.
CONTRAST:  20mL MULTIHANCE GADOBENATE DIMEGLUMINE 529 MG/ML IV SOLN

[Series 3: T2 fat-sat · axial · 5.0mm · 0.78mm/px · 1 of 58 slices shown]
[im 1/58]
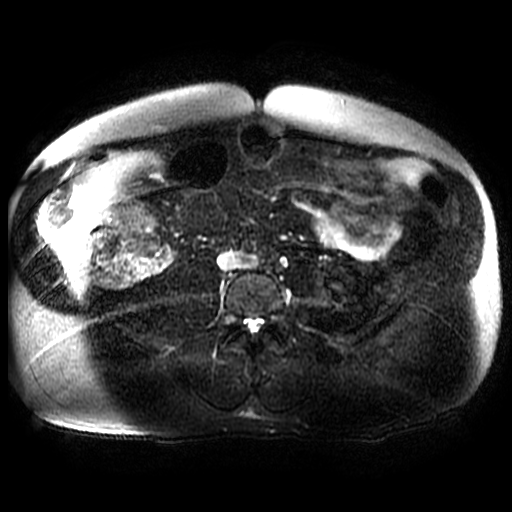

[Series 4: MRCP · coronal · 1.6mm · 0.62mm/px · 1 of 117 slices shown (1 of 2)]
[im 1/117]
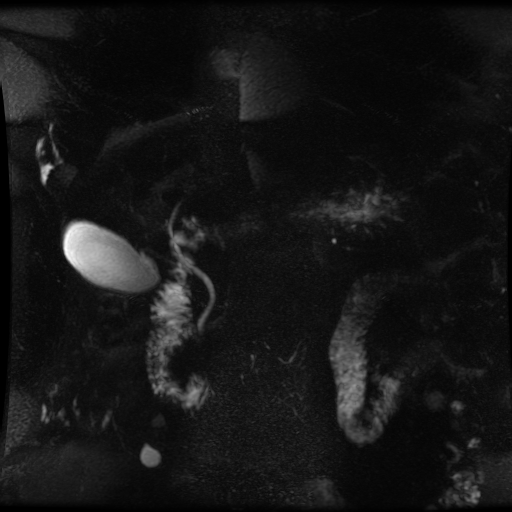

[Series 6: DWI b500 · axial · 6.0mm · 1.48mm/px · 1 of 79 slices shown]
[im 1/79]
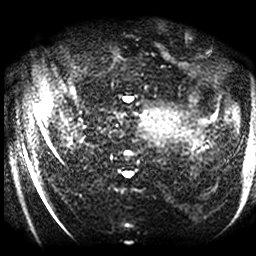

[Series 7: ax dualecho · axial · 5.0mm · 0.78mm/px · 1 of 110 slices shown]
[im 1/110]
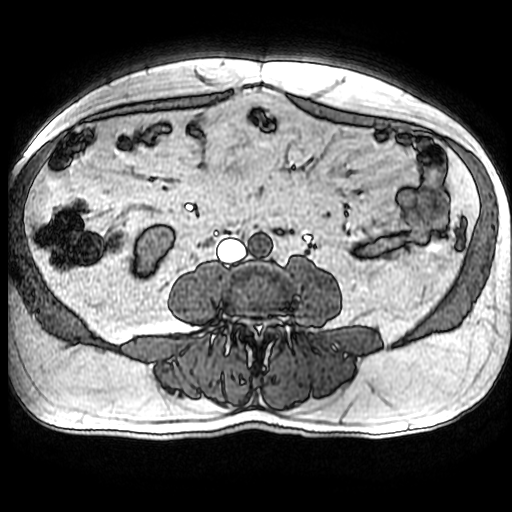

[Series 8: MRCP · coronal · 40.0mm · 0.70mm/px · 1 of 6 slices shown (2 of 2)]
[im 1/6]
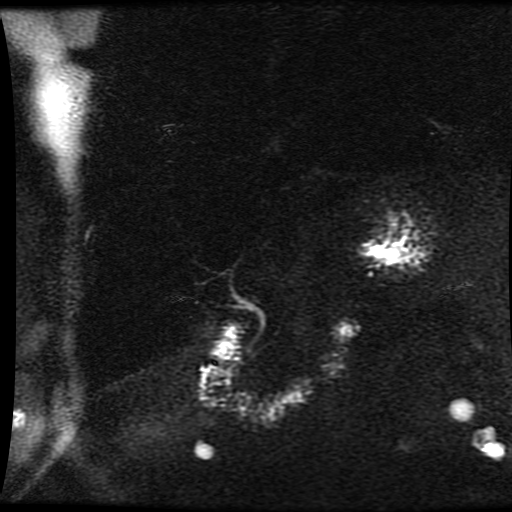

[Series 9: bSSFP fat-sat · coronal · 5.0mm · 0.78mm/px · 1 of 53 slices shown]
[im 1/53]
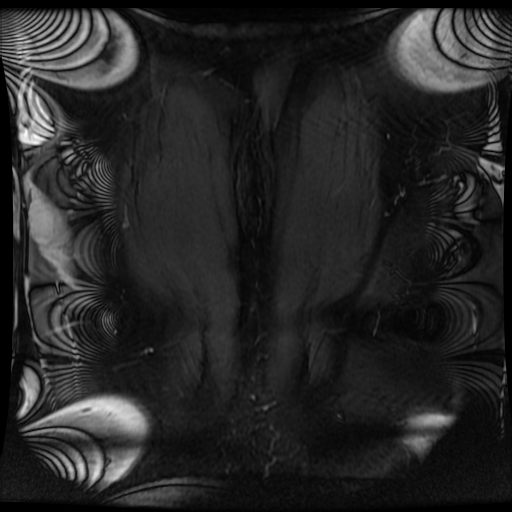

[Series 10: T2 · axial · 5.0mm · 0.78mm/px · 1 of 58 slices shown]
[im 1/58]
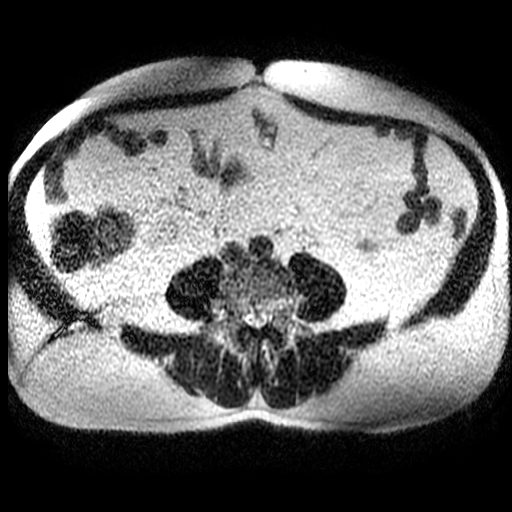

[Series 400: reformatted · axial · 1.6mm · 0.62mm/px · 1 of 117 slices shown (1 of 2)]
[im 1/117]
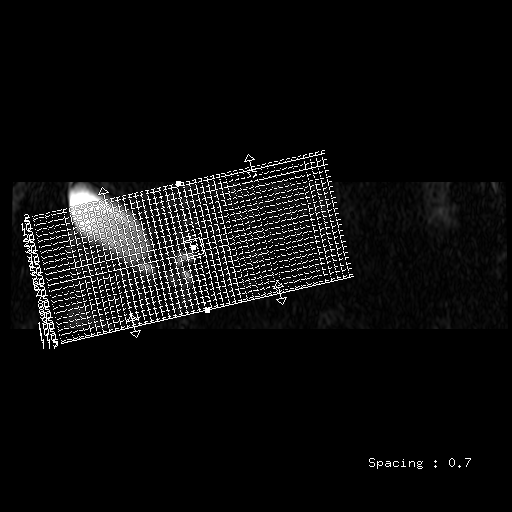

[Series 401: reformatted · coronal · 100.0mm · 0.51mm/px · 1 of 180 slices shown (2 of 2)]
[im 1/180]
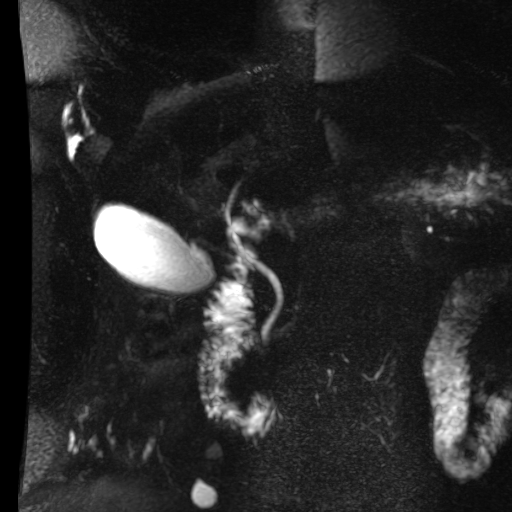

[Series 600: DWI · axial · 6.0mm · 1.48mm/px · 1 of 40 slices shown]
[im 1/40]
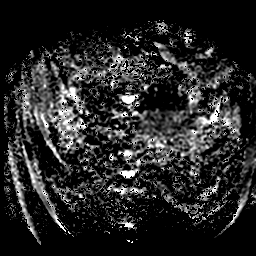

[Series 1100: T1 dynamic · axial · 6.0mm · 0.78mm/px · 1 of 96 slices shown (1 of 5)]
[im 1/96]
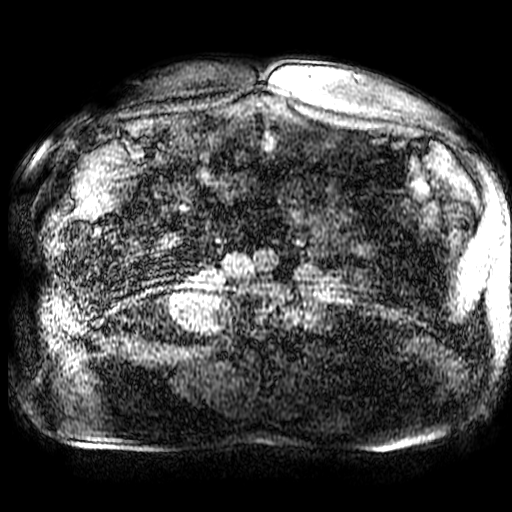

[Series 1101: T1 dynamic · axial · 6.0mm · 0.78mm/px · 1 of 96 slices shown (2 of 5)]
[im 1/96]
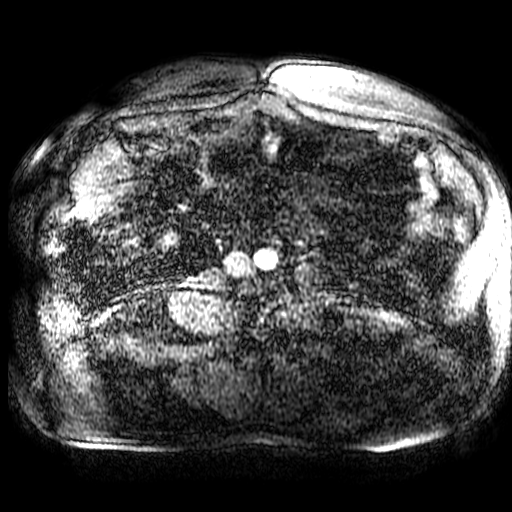

[Series 1103: T1 dynamic · axial · 6.0mm · 0.78mm/px · 1 of 96 slices shown (3 of 5)]
[im 1/96]
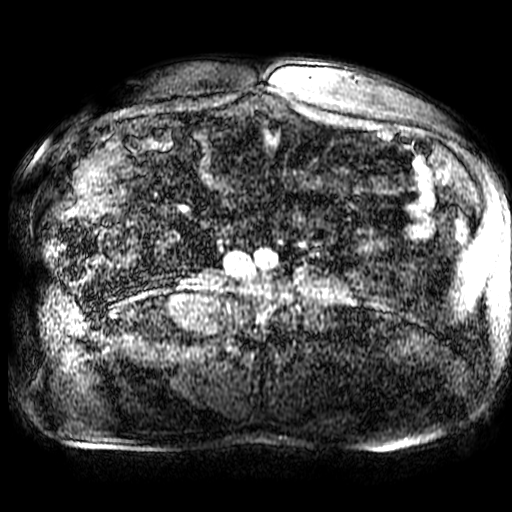

[Series 1104: T1 dynamic · axial · 6.0mm · 0.78mm/px · 1 of 96 slices shown (4 of 5)]
[im 1/96]
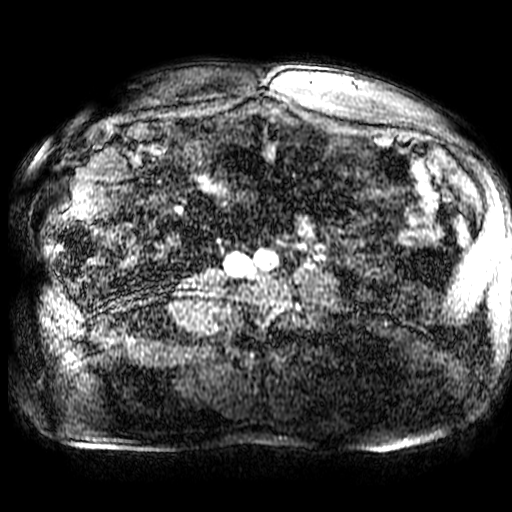

[Series 1105: T1 dynamic · axial · 6.0mm · 0.78mm/px · 1 of 96 slices shown (5 of 5)]
[im 1/96]
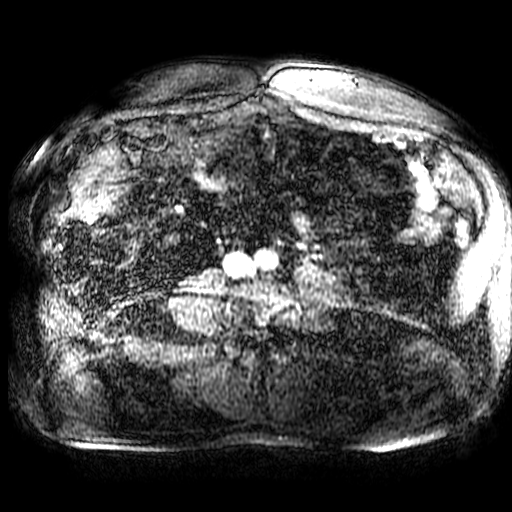

[((id)/(id)/1)-((id)/(id)/1) · axial · 6.0mm · 0.78mm/px · 1 of 96 slices shown (1 of 4)]
[im 1/96]
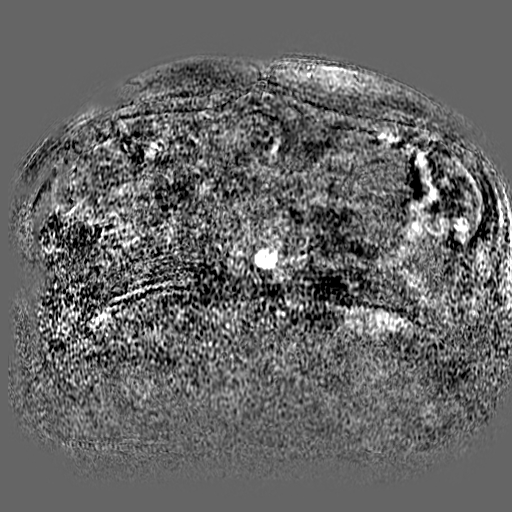

[((id)/(id)/1)-((id)/(id)/1) · axial · 6.0mm · 0.78mm/px · 1 of 96 slices shown (2 of 4)]
[im 1/96]
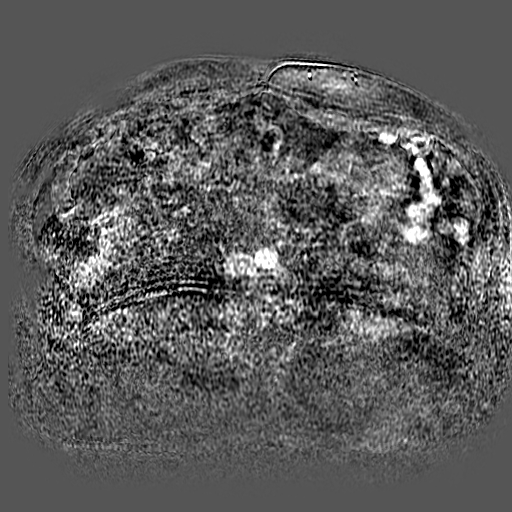

[((id)/(id)/1)-((id)/(id)/1) · axial · 6.0mm · 0.78mm/px · 1 of 96 slices shown (3 of 4)]
[im 1/96]
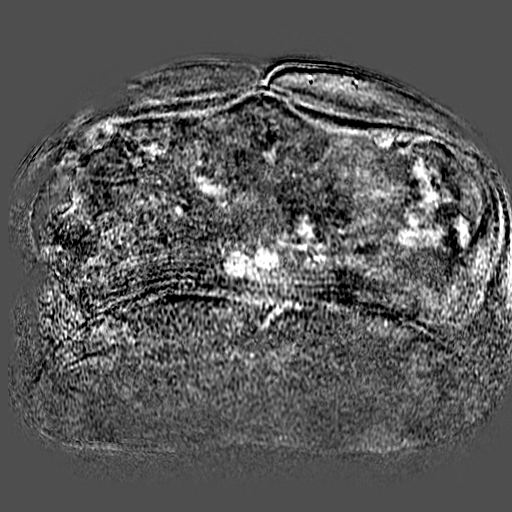

[((id)/(id)/1)-((id)/(id)/1) · axial · 6.0mm · 0.78mm/px · 1 of 96 slices shown (4 of 4)]
[im 1/96]
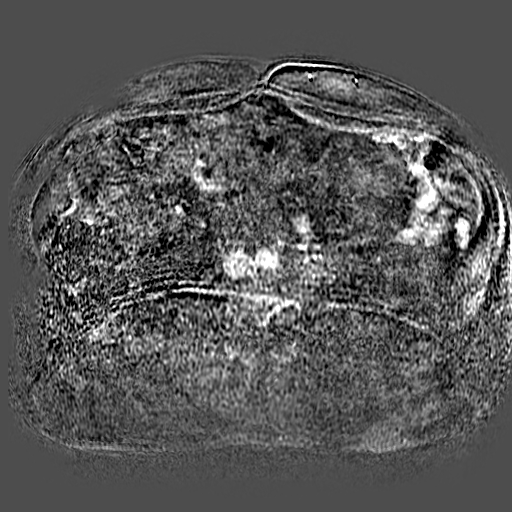

[19 of 23 positions shown; findings below may reference images not displayed]

FINDINGS: Lower chest:  No acute findings.

Hepatobiliary: No mass or other parenchymal abnormality identified.
Gallbladder is unremarkable. No evidence of biliary ductal
dilatation with common bile duct measuring 4 mm. No evidence of
choledocholithiasis. No evidence choledochal cyst or choledochocele.

Pancreas: No mass, inflammatory changes, or other parenchymal
abnormality identified. No evidence of pancreatic ductal dilatation
or pancreas divisum.

Spleen:  Within normal limits in size and appearance.

Adrenals/Urinary Tract: No masses identified. No evidence of
hydronephrosis. Small benign appearing renal cysts noted
bilaterally. Retro aortic left renal vein incidentally noted

Stomach/Bowel: Bowel is difficult to evaluate due to lack of
distention. There is a subtle masslike filling defect with
heterogeneous enhancement in the proximal duodenum in the region of
the ampulla. This measures approximately 2.5 x 2.7 cm on image 75 of
series [DATE] and image 30 of series 9. This likely corresponds to
the ampullary mass seen on endoscopy. There is no evidence of
gastric or duodenal obstruction.

Vascular/Lymphatic: No pathologically enlarged lymph nodes
identified. No abdominal aortic aneurysm demonstrated.

Other:  None.

Musculoskeletal:  No suspicious bone lesions identified.
IMPRESSION: 2.7 cm intraluminal masslike filling defect in proximal duodenum in
region of the ampulla, likely corresponding to the ampullary mass
seen on endoscopy. No evidence of biliary ductal dilatation or
choledochocele.

No evidence of pancreatic mass or pancreatic ductal dilatation.

No evidence of metastatic disease.

## 2017-07-12 ENCOUNTER — Other Ambulatory Visit: Payer: Self-pay | Admitting: Neurology

## 2017-08-17 ENCOUNTER — Other Ambulatory Visit: Payer: Self-pay | Admitting: Neurology

## 2017-08-17 ENCOUNTER — Telehealth: Payer: Self-pay | Admitting: Neurology

## 2017-08-17 MED ORDER — LACOSAMIDE 150 MG PO TABS: 150.0000 mg | ORAL_TABLET | Freq: Two times a day (BID) | ORAL | 4 refills | Status: DC

## 2017-08-17 NOTE — Addendum Note (Signed)
Addended by: Minna Antis on: 08/17/2017 01:32 PM   Modules accepted: Orders

## 2017-08-17 NOTE — Telephone Encounter (Signed)
Vimpat refilled to [facility] per patient's request. Rx on Dr Rhea Belton desk for [employer].

## 2017-08-17 NOTE — Telephone Encounter (Signed)
[facility] [place]

## 2017-08-17 NOTE — Telephone Encounter (Signed)
Pts wife is requesting a refill for Vimpat 150mg  [facility] [address]

## 2017-08-17 NOTE — Telephone Encounter (Signed)
Patient requesting refill of Vimpat [facility] [address] [facility] [address] [employer].

## 2017-08-29 ENCOUNTER — Telehealth: Payer: Self-pay

## 2017-08-29 MED ORDER — RANITIDINE HCL 300 MG PO TABS: 300.0000 mg | ORAL_TABLET | Freq: Every day | ORAL | 2 refills | Status: DC

## 2017-08-29 NOTE — Telephone Encounter (Signed)
Refilled Zantac

## 2017-09-19 DIAGNOSIS — M79676 Pain in unspecified toe(s): Secondary | ICD-10-CM

## 2017-09-24 ENCOUNTER — Encounter (HOSPITAL_COMMUNITY): Payer: Self-pay | Admitting: Emergency Medicine

## 2017-09-24 ENCOUNTER — Ambulatory Visit (HOSPITAL_COMMUNITY)
Admission: EM | Admit: 2017-09-24 | Discharge: 2017-09-24 | Disposition: A | Discharge status: Home / Self Care | Payer: BLUE CROSS/BLUE SHIELD | Attending: Family Medicine | Admitting: Family Medicine

## 2017-09-24 ENCOUNTER — Other Ambulatory Visit: Payer: Self-pay

## 2017-09-24 DIAGNOSIS — M542 Cervicalgia: Secondary | ICD-10-CM | POA: Diagnosis not present

## 2017-09-24 MED ORDER — KETOROLAC TROMETHAMINE 60 MG/2ML IM SOLN
INTRAMUSCULAR | Status: AC
  Filled 2017-09-24: qty 2

## 2017-09-24 MED ORDER — KETOROLAC TROMETHAMINE 60 MG/2ML IM SOLN
60.0000 mg | Freq: Once | INTRAMUSCULAR | Status: AC
  Administered 2017-09-24: 60 mg via INTRAMUSCULAR

## 2017-09-24 MED ORDER — PREDNISONE 50 MG PO TABS: 50.0000 mg | ORAL_TABLET | Freq: Every day | ORAL | 0 refills | Status: AC

## 2017-09-24 MED ORDER — METHOCARBAMOL 750 MG PO TABS: 750.0000 mg | ORAL_TABLET | Freq: Four times a day (QID) | ORAL | 0 refills | Status: AC

## 2017-09-24 NOTE — ED Triage Notes (Signed)
Patient woke on Monday morning with neck pain.  Pain is getting worse.  Denies any injury.  Pain is in center neck

## 2017-09-24 NOTE — Discharge Instructions (Signed)
Please begin prednisone daily for the next 5 days.  I have also sent in Robaxin which is a muscle relaxer.  Please use when at home for the first time in case this causes sedation.  You may continue Tylenol and ibuprofen with these medicines safely.  Do not take additional Tylenol with your hydrocodone.  Please go to emergency room if you have any headache, changes in vision, worsening pain, difficulty speaking, weakness, dizziness, lightheadedness, change in mental status  Please follow-up here or with his primary provider in 1 week if not having any improvement.

## 2017-09-24 NOTE — ED Provider Notes (Signed)
[facility]    CSN: [id number] Arrival date & time: 09/24/17  1459     History   Chief Complaint Chief Complaint  Patient presents with  . Torticollis    HPI Samuel Jacobs is a 67 y.o. male history of hypertension, hyperlipidemia presenting today with neck pain.  Pain has been present for approximately 6 days and has continued to worsen.  States that it is a throbbing sensation located just to the right of his upper cervical spine.  Denies any nausea, vomiting, vision changes.  Denies pain radiating into arms.  Has had some tingling into his right hand.  Still able to move neck.  Denies weakness, difficulty speaking, difficulty walking.  He has taken hydrocodone and ibuprofen at home without relief.  He uses hydrocodone for back pain at night.  HPI  Past Medical History:  Diagnosis Date  . Allergic rhinitis   . Allergy   . Anxiety   . Arthritis   . Carpal tunnel syndrome    09-12-15 no longer a problem.  . Colon polyps   . Complication of anesthesia    one time awaken- not with last procedure done.  . Depression   . ED (erectile dysfunction)   . GERD (gastroesophageal reflux disease)   . HLD (hyperlipidemia)   . Insomnia   . Seizure ([place])    08-13-15 per pt his last seizure was "about 1 year ago".  maw  . Sleep apnea    pt does not wear c-pap    Patient Active Problem List   Diagnosis Date Noted  . Insomnia 08/21/2015  . Hallux rigidus of both feet 05/29/2015  . 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 Encounter for long-term (current) use of other medications 04/03/2013    Past Surgical History:  Procedure Laterality Date  . CHOLECYSTECTOMY  11/07/2015  . COLONOSCOPY    . EUS N/A 09/18/2015   Procedure: UPPER ENDOSCOPIC ULTRASOUND (EUS) LINEAR;  Surgeon: Milus Banister, MD;  Location: [facility];  Service: Endoscopy;  Laterality: N/A;  . TONSILLECTOMY    . 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  Marland Kitchen UPPER GASTROINTESTINAL ENDOSCOPY    . WISDOM TOOTH EXTRACTION         Home Medications    Prior to Admission medications   Medication Sig Start Date End Date Taking? Authorizing Provider  acetaminophen (TYLENOL) 650 MG CR tablet Take 1,300 mg by mouth every 8 (eight) hours.    [provider]  b complex vitamins tablet Take 1 tablet by mouth daily.    [provider]  Cholecalciferol (VITAMIN D PO) Take 1 tablet by mouth daily.    [provider]  esomeprazole (NEXIUM) 40 MG capsule Take 1 capsule by mouth 2 (two) times daily. 06/18/16   [provider]  fluticasone (FLONASE) 50 MCG/ACT nasal spray Place 1 spray into both nostrils daily as needed for allergies. Reported on 08/13/2015 11/26/14   [provider]  glucosamine-chondroitin 500-400 MG tablet Take 1 tablet by mouth daily.    [provider]  HYDROcodone-acetaminophen (NORCO/VICODIN) 5-325 MG tablet Take 1 tablet by mouth every 8 (eight) hours as needed for moderate pain.  07/18/15   [provider]  Lacosamide 150 MG TABS Take 1 tablet (150 mg total) by mouth 2 (two) times daily. 08/17/17   Marcial Pacas, MD  meloxicam (MOBIC) 15 MG tablet TAKE 1 TABLET BY MOUTH EVERY DAY 12/09/16   Jacqualyn Posey,  Bonna Gains, DPM  methocarbamol (ROBAXIN-750) 750 MG tablet Take 1 tablet (750 mg total) by mouth 4 (four) times daily for 10 days. 09/24/17 10/04/17  Brion Hedges C, PA-C  Multiple Vitamins-Minerals (CENTRUM SILVER ADULT 50+) TABS Take 1 tablet by mouth daily.    [provider]  pantoprazole (PROTONIX) 40 MG tablet Take 40 mg by mouth daily.    [provider]  predniSONE (DELTASONE) 50 MG tablet Take 1 tablet (50 mg total) by mouth daily for 5 days. 09/24/17 09/29/17  Kimora Stankovic C, PA-C  ranitidine (ZANTAC) 300 MG tablet Take 1 tablet (300 mg total) by mouth at bedtime. 08/29/17   Irene Shipper, MD  terbinafine (LAMISIL) 250 MG tablet Take 1 tablet (250  mg total) by mouth daily. 08/13/16   Trula Slade, DPM  thiamine (VITAMIN B-1) 100 MG tablet Take 100 mg by mouth daily.    [provider]  traZODone (DESYREL) 100 MG tablet TAKE 1 TABLET BY MOUTH EVERY NIGHT AT BEDTIME 07/13/17   Marcial Pacas, MD  divalproex (DEPAKOTE ER) 500 MG 24 hr tablet Take 1 tablet (500 mg total) by mouth daily. Take one tablet PO in the morning and one tablets PO at night. Patient not taking: Reported on 04/15/2015 09/06/14 04/15/15  Ward Givens, NP    Family History Family History  Problem Relation Age of Onset  . Breast cancer Mother        mets to liver  . Stomach cancer Father   . Epilepsy Sister   . Cancer Maternal Grandmother        type unknown  . Cancer Maternal Grandfather        type unknown  . Cancer Paternal Grandfather        type unknown  . Cancer Paternal Grandmother        type unknown  . Colon cancer Neg Hx   . Esophageal cancer Neg Hx   . Rectal cancer Neg Hx   . Prostate cancer Neg Hx     Social History Social History   Tobacco Use  . Smoking status: Never Smoker  . Smokeless tobacco: Never Used  Substance Use Topics  . Alcohol use: No    Alcohol/week: 0.0 oz    Comment: past -none in 7-8 yrs social only  . Drug use: No     Allergies   Sulfur   Review of Systems Review of Systems  Constitutional: Negative for activity change, appetite change, fatigue and fever.  Eyes: Negative for visual disturbance.  Respiratory: Negative for shortness of breath.   Cardiovascular: Negative for chest pain.  Gastrointestinal: Negative for abdominal pain, nausea and vomiting.  Musculoskeletal: Positive for myalgias, neck pain and neck stiffness. Negative for arthralgias, gait problem and joint swelling.  Skin: Negative for rash and wound.  Neurological: Negative for dizziness, weakness, light-headedness, numbness and headaches.     Physical Exam Triage Vital Signs ED Triage Vitals  Enc Vitals Group     BP 09/24/17  1559 (!) 162/86     Pulse Rate 09/24/17 1559 (!) 59     Resp 09/24/17 1559 18     Temp 09/24/17 1559 98.1 F (36.7 C)     Temp Source 09/24/17 1559 Oral     SpO2 09/24/17 1559 96 %     Weight --      Height --      Head Circumference --      Peak Flow --      Pain Score  09/24/17 1557 7     Pain Loc --      Pain Edu? --      Excl. in [place]? --    No data found.  Updated Vital Signs BP (!) 162/86 (BP Location: Left Arm)   Pulse (!) 59   Temp 98.1 F (36.7 C) (Oral)   Resp 18   SpO2 96%   Visual Acuity Right Eye Distance:   Left Eye Distance:   Bilateral Distance:    Right Eye Near:   Left Eye Near:    Bilateral Near:     Physical Exam  Constitutional: He is oriented to person, place, and time. He appears well-developed and well-nourished.  HENT:  Head: Normocephalic and atraumatic.  Eyes: Conjunctivae and EOM are normal. Pupils are equal, round, and reactive to light.  Neck: Neck supple.  Cardiovascular: Normal rate.  No murmur heard. Pulmonary/Chest: Effort normal. No respiratory distress.  Abdominal: Soft. There is no tenderness.  Musculoskeletal: He exhibits tenderness. He exhibits no edema.  Tenderness to palpation of musculature just lateral on the right to upper cervical spine near the hairline.  No focal midline tenderness to palpation of cervical spine, thoracic and lumbar spine.  Patient has full range of motion of neck and rightward and leftward rotation as well as flexion and extension.  Neurological: He is alert and oriented to person, place, and time. No cranial nerve deficit.  Cranial nerves II through XII grossly intact, strength 5/5 bilaterally at shoulders and hips.  Patellar reflexes 1+ bilaterally, biceps tendon reflex difficult to obtain bilaterally.  Skin: Skin is warm and dry.  Psychiatric: He has a normal mood and affect.  Nursing note and vitals reviewed.    UC Treatments / Results  Labs (all labs ordered are listed, but only abnormal  results are displayed) Labs Reviewed - No data to display  EKG  EKG Interpretation None       Radiology No results found.  Procedures Procedures (including critical care time)  Medications Ordered in UC Medications - No data to display   Initial Impression / Assessment and Plan / UC Course  I have reviewed the triage vital signs and the nursing notes.  Pertinent labs & imaging results that were available during my care of the patient were reviewed by me and considered in my medical decision making (see chart for details).     Patient with persistent neck pain, no focal neuro deficits.  Blood pressure 162/86.  Patient already using NSAIDs and hydrocodone at home, will provide Robaxin and prednisone.  Discussed signs of stroke and signs concerning for something more intracranial causing the pain.  Patient and wife verbalized understanding, and plan to go to emergency room if pain worsening or symptoms changing. Discussed strict return precautions. Patient verbalized understanding and is agreeable with plan.   Final Clinical Impressions(s) / UC Diagnoses   Final diagnoses:  Neck pain    ED Discharge Orders        Ordered    methocarbamol (ROBAXIN-750) 750 MG tablet  4 times daily     09/24/17 1649    predniSONE (DELTASONE) 50 MG tablet  Daily     09/24/17 1649       Controlled Substance Prescriptions [place] Controlled Substance Registry consulted? Not Applicable   Janith Lima, [place] 09/24/17 1701

## 2017-10-04 ENCOUNTER — Telehealth: Payer: Self-pay | Admitting: Neurology

## 2017-10-04 NOTE — Telephone Encounter (Signed)
I attempted to reach patient twice.  No answer and mailbox is full.

## 2017-10-04 NOTE — Telephone Encounter (Signed)
Pt has called re: traZODone (DESYREL) 100 MG tablet he states it is not working and would like to know if he can get an increase to 150mg .  If so please [facility] to  [id number] [place] [place] [facility] [address] [facility] [address] [phone] (Phone) [phone] (Fax)

## 2017-10-05 ENCOUNTER — Telehealth: Payer: Self-pay | Admitting: Neurology

## 2017-10-05 DIAGNOSIS — G4700 Insomnia, unspecified: Secondary | ICD-10-CM

## 2017-10-05 MED ORDER — CLONAZEPAM 1 MG PO TABS: 1.0000 mg | ORAL_TABLET | Freq: Every day | ORAL | 0 refills | Status: DC

## 2017-10-05 NOTE — Telephone Encounter (Signed)
Spoke to patient - states he is able to fall asleep, with Trazodone 100mg  at bedtime, but is waking up 2-3 times per night.  This disruption in his sleep is causing excessive daytime somnolence the following day.

## 2017-10-05 NOTE — Telephone Encounter (Signed)
I was able to reach patient - he is agreeable to the plan below and will contact his PCP.  Dr. Krista Blue has signed the clonazepam prescription.  It has been faxed and confirmed to his pharmacy on file.

## 2017-10-05 NOTE — Telephone Encounter (Signed)
Yolanda from Dr Danna Hefty office called re: pt stating he was told to be evaluated for insomnia.  Dr Avva(according to Denman George) is asking why pt wasn't set up for sleep study here.  They were about to send referral for pt to see Dr Brett Fairy before they realized Dr Krista Blue is a Neurologist.  Denman George is asking for a call back at [phone]

## 2017-10-05 NOTE — Addendum Note (Signed)
Addended by: Noberto Retort C on: 10/05/2017 12:23 PM   Modules accepted: Orders

## 2017-10-05 NOTE — Telephone Encounter (Signed)
Spoke to Dr. Krista Blue - she has agreed to place orders for sleep consult for his insomnia and frequent disruption of sleep.  I have returned the call to [facility] to notify her of this information.

## 2017-10-05 NOTE — Telephone Encounter (Signed)
Per vo by Dr. Krista Blue, he needs to have his insomnia formally evaluated by his PCP.  He can continue to take Trazodone 100mg  at bedtime and she will also provide a 30-day supply of clonazepam 1mg , one tablet at bedtime with no refills.  This will hopefully provide him with relief of his symptoms until he is able to see his PCP.    I attempted to contact patient - no answer and his mailbox is full.

## 2017-10-05 NOTE — Addendum Note (Signed)
Addended by: Desmond Lope on: 10/05/2017 04:39 PM   Modules accepted: Orders

## 2017-11-20 ENCOUNTER — Other Ambulatory Visit: Payer: Self-pay | Admitting: Internal Medicine

## 2017-12-06 ENCOUNTER — Ambulatory Visit: Payer: BLUE CROSS/BLUE SHIELD | Admitting: Neurology

## 2017-12-06 ENCOUNTER — Encounter: Payer: Self-pay | Admitting: Neurology

## 2017-12-06 VITALS — BP 139/83 | HR 57 | Ht 71.0 in | Wt 251.0 lb

## 2017-12-06 DIAGNOSIS — G479 Sleep disorder, unspecified: Secondary | ICD-10-CM | POA: Diagnosis not present

## 2017-12-06 DIAGNOSIS — R569 Unspecified convulsions: Secondary | ICD-10-CM | POA: Diagnosis not present

## 2017-12-06 DIAGNOSIS — G4719 Other hypersomnia: Secondary | ICD-10-CM

## 2017-12-06 DIAGNOSIS — R0683 Snoring: Secondary | ICD-10-CM

## 2017-12-06 DIAGNOSIS — G40A09 Absence epileptic syndrome, not intractable, without status epilepticus: Secondary | ICD-10-CM

## 2017-12-06 NOTE — Progress Notes (Signed)
PATIENT: Samuel Jacobs          SLEEP CONSULTATION                                                               DOB: [birth year]  HISTORY FROM: patient  HISTORY OF PRESENT ILLNESS:    REASON FOR VISIT: Samuel Jacobs is a 67 year old Caucasian gentleman who has followed this office for seizure disorder for many years.  He was seen on February 27, 2010 for sleep study after he had endorsed the Epworth Sleepiness Scale at 6 out of 24 points.  He had very mild apnea at the time to mild to justify treatment with CPAP no associated desaturations, only and REM sleep was there and uptake of apnea to an AHI of 18.1, overall AHI was 8.7 only.  And he had also mentioned some anxiety and insomnia at the time for which she could not find a physiologic explanation in the sleep study itself.  Dr. Danna Hefty office is now referring him back.  He has had recurrent seizures while taking Depakote in the past eventually Vimpat was added about 4 years ago and he has been better controlled ever since.  He snores now, he gained weight and he struggles staying awake at work. His last sleep study was almost 8 years ago.   He watches TV before he goes to his bedroom, usually is in bed by 10-11 Pm at night, and after taking trazodone he can sleep. Bedroom is cool, quiet and dark. Trazodone works within 30 minutes. He sleeps on his side, he snores but it's worse on his back  - according to his spouse. She sleeps on one pillow.  He sometimes sleeps in a recliner and snores very loud. His wife could not hear any apneas.  He dreams a lot, some nights is active in his dreams, kicks and thrashing.  No sleep walking ( used to as a young adult).  He has 1-2 nocturias. Resumes sleep after that unless he went to the bathroom later than 4 AM and cannot sleep again.  Sleeps usually 5-6 hours .  His alarm rings at 5. 30 AM and by 6 AM he is up. Drinks coffee for breakfast, eats protein bars at work.  Struggles with sleepiness at work.   On week  ends sleeping the same pattern, on Sundays sleeps until 7 AM and feels better rested. Naps of 1-2 hours on weekends- sundays.    per Dr Krista Blue . : HISTORY 08/21/15 (Per Dr. Krista Blue notes): Samuel Jacobs is a 67 year old male with a history of complex partial seizure seizure He continue have recurrent seizures while taking Depakote, eventually Vimpat was added on in 2015, Depakote was weaned off, while taking Vimpat 150 mg twice a day, he denies any seizure event. 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 He complains of dizziness, feeling funny with higher dose of Vimpat Previous sleep study in 2011 showed mild obstructive sleep  apnea, CPAP was not recommended at that time. He has quit tramadol, he is now taking hydrocodone for low back pain. No longer has frequent headaches, was to go back on lower dose of Vimpat  Last seizure was in 2015, his seizure consistent of staring, confusion. He has difficulty falling to sleep due to his shift job, tried over-the-counter Tylenol PM without benefit, he also complains of depression anxiety Update February 21 2017: He is taking Vimpat 150 twice a day, there was no recurrent seizure activity, he is driving, trazodone 409 mg for sleep.     REVIEW OF SYSTEMS: Out of a complete 14 system review of symptoms, the patient complains only of the following symptoms, and all other reviewed systems are snoring, GERD, indigestions, burping. Not enough sleep.   Epworth Sleepiness score endorsed 15/ 24 points, FSS at 46 points.   ALLERGIES: Allergies  Allergen Reactions  . Sulfur Nausea Only    HOME MEDICATIONS: Outpatient Medications  Prior to Visit  Medication Sig Dispense Refill  . esomeprazole (NEXIUM) 40 MG capsule Take 1 capsule by mouth 2 (two) times daily.    . fluticasone (FLONASE) 50 MCG/ACT nasal spray Place 1 spray into both nostrils daily as needed for allergies. Reported on 08/13/2015    . HYDROcodone-acetaminophen (NORCO/VICODIN) 5-325 MG tablet Take 1 tablet by mouth every 8 (eight) hours as needed for moderate pain.   0  . irbesartan (AVAPRO) 300 MG tablet TAKE 1 TABLET BY MOUTH DAILY *INCREASED DOSE*  6  . Lacosamide 150 MG TABS Take 1 tablet (150 mg total) by mouth 2 (two) times daily. 180 tablet 4  . meloxicam (MOBIC) 15 MG tablet TAKE 1 TABLET BY MOUTH EVERY DAY 30 tablet 0  . traZODone (DESYREL) 100 MG tablet TAKE 1 TABLET BY MOUTH EVERY NIGHT AT BEDTIME 30 tablet 9  . acetaminophen (TYLENOL) 650 MG CR tablet Take 1,300 mg by mouth every 8 (eight) hours.    Marland Kitchen b complex vitamins tablet Take 1 tablet by mouth daily.    . Cholecalciferol (VITAMIN D PO) Take 1 tablet by mouth daily.    . clonazePAM (KLONOPIN) 1 MG tablet Take 1 tablet (1 mg total) by mouth at bedtime. 30 tablet 0  . glucosamine-chondroitin 500-400 MG tablet Take 1 tablet by mouth daily.    . Multiple Vitamins-Minerals (CENTRUM SILVER ADULT 50+) TABS Take 1 tablet by mouth daily.    . pantoprazole (PROTONIX) 40 MG tablet Take 40 mg by mouth daily.    . ranitidine (ZANTAC) 300 MG tablet TAKE 1 TABLET BY MOUTH AT BEDTIME 30 tablet 2  . terbinafine (LAMISIL) 250 MG tablet Take 1 tablet (250 mg total) by mouth daily. 90 tablet 0  . thiamine (VITAMIN B-1) 100 MG tablet Take 100 mg by mouth daily.     No facility-administered medications prior to visit.     PAST MEDICAL HISTORY: Past Medical History:  Diagnosis Date  . Allergic rhinitis   . Allergy   . Anxiety   . Arthritis   . Carpal tunnel syndrome    09-12-15 no longer a problem.  . Colon polyps   . Complication of anesthesia    one time awaken- not with last procedure done.  .  Depression   . ED (erectile dysfunction)   . GERD (gastroesophageal reflux disease)   . HLD (hyperlipidemia)   . Insomnia   . Seizure ([place])    08-13-15 per pt his last seizure was "about 1 year ago".  maw  .  Sleep apnea    pt does not wear c-pap    PAST SURGICAL HISTORY: Past Surgical History:  Procedure Laterality Date  . CHOLECYSTECTOMY  11/07/2015  . COLONOSCOPY    . EUS N/A 09/18/2015   Procedure: UPPER ENDOSCOPIC ULTRASOUND (EUS) LINEAR;  Surgeon: Milus Banister, MD;  Location: [facility];  Service: Endoscopy;  Laterality: N/A;  . TONSILLECTOMY    . 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  Marland Kitchen UPPER GASTROINTESTINAL ENDOSCOPY    . WISDOM TOOTH EXTRACTION      FAMILY HISTORY: Family History  Problem Relation Age of Onset  . Breast cancer Mother        mets to liver  . Stomach cancer Father   . Epilepsy Sister   . Cancer Maternal Grandmother        type unknown  . Cancer Maternal Grandfather        type unknown  . Cancer Paternal Grandfather        type unknown  . Cancer Paternal Grandmother        type unknown  . Colon cancer Neg Hx   . Esophageal cancer Neg Hx   . Rectal cancer Neg Hx   . Prostate cancer Neg Hx     SOCIAL HISTORY: Social History   Socioeconomic History  . Marital status: Married    Spouse name: Gailen Shelter  . Number of children: 3  . Years of education: college  . Highest education level: Not on file  Occupational History  . Occupation: semi-retired  Social Needs  . Financial resource strain: Not on file  . Food insecurity:    Worry: Not on file    Inability: Not on file  . Transportation needs:    Medical: Not on file    Non-medical: Not on file  Tobacco Use  . Smoking status: Never Smoker  . Smokeless tobacco: Never Used  Substance and Sexual Activity  . Alcohol use: No    Alcohol/week: 0.0 oz    Comment: past -none in 7-8 yrs social only  . Drug use: No  . Sexual activity: Not on file  Lifestyle   . Physical activity:    Days per week: Not on file    Minutes per session: Not on file  . Stress: Not on file  Relationships  . Social connections:    Talks on phone: Not on file    Gets together: Not on file    Attends religious service: Not on file    Active member of club or organization: Not on file    Attends meetings of clubs or organizations: Not on file    Relationship status: Not on file  . Intimate partner violence:    Fear of current or ex partner: Not on file    Emotionally abused: Not on file    Physically abused: Not on file    Forced sexual activity: Not on file  Other Topics Concern  . Not on file  Social History Narrative   Patient lives at home with wife Neoma Laming.    Patient has retired and works part time at [employer]   Patient has 3 children.    Patient has a college education.   Patient is right handed.      PHYSICAL EXAM  Vitals:   12/06/17 1522  BP: 139/83  Pulse: (!) 57  Weight: 251 lb (113.9 kg)  Height: 5\' 11"  (1.803 m)   Body mass index is 35.01 kg/m.  Generalized: Well developed, in no acute distress.  Samuel Jacobs's neck size is 17 and three-quarter inches, his Mallampati is grade 2, the tongue is slightly deviated and they are scars from a former tongue bite.  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  He has a patent nasal airway.  Neurological examination  Mentation:  Taste and smell are unimpaired. Alert oriented to time, place, history taking. Follows all commands speech and language fluent Cranial nerve: Pupils were equal round reactive to light. Extraocular movements were full, visual field were full on confrontational test.  Facial sensation and strength were normal. Ptosis on the right. Uvula midline. Head turning , shoulder shrug were symmetric. Motor:  5/ 5 strength of all 4 extremities,  symmetric motor tone is noted throughout.  Grip strength intact .  Sensory: intact to soft touch  and vibration on all 4 extremities. No evidence of  extinction is noted.  Coordination:  finger-nose bilaterally intact with only mild intention tremor on the left. Gait and station: Gait is intact, Stance is wider.  Tandem gait is unsteady. Romberg not performed.  Reflexes: symmetric bilaterally.   DIAGNOSTIC DATA (LABS, IMAGING, TESTING) - I reviewed patient records, labs, notes, testing and imaging myself where available.  Lab Results  Component Value Date   WBC 7.7 08/23/2016   HGB 14.4 08/23/2016   HCT 42.6 08/23/2016   MCV 86 08/23/2016   PLT 295 08/23/2016      Component Value Date/Time   NA 138 04/15/2015 0658   NA 134 05/02/2014 1139   K 4.7 04/15/2015 0658   CL 103 04/15/2015 0658   CO2 27 04/15/2015 0658   GLUCOSE 150 (H) 04/15/2015 0658   BUN 18 04/15/2015 0658   BUN 16 05/02/2014 1139   CREATININE 1.20 09/05/2015 0822   CALCIUM 9.4 04/15/2015 0658   PROT 7.3 08/23/2016 0836   ALBUMIN 4.4 08/23/2016 0836   AST 24 08/23/2016 0836   ALT 25 08/23/2016 0836   ALKPHOS 63 08/23/2016 0836   BILITOT 0.3 08/23/2016 0836   GFRNONAA >60 04/15/2015 0658   GFRAA >60 04/15/2015 0658      ASSESSMENT AND PLAN 67 y.o. year old male patient , followed for a seizure disorder by dr Krista Blue, now presenting for re-evaluation of excessive daytime sleepiness and an Epworth sleepiness score of 15 points, a feeling that sleep is fragmented and not sustained, also noticed sound as it could be.  His wife reports that he snores but she has not confirmed that she has heard apneas.  Last sleep study is almost 67 years old and at the time the patient had only mild apnea, too mild for intervention.  This may have changed with his aging processes.  His weight is similar.   He also now is on Vimpat which does have a phenobarbital component and may cause some additional sedation and even muscle relaxation.  It also seems to suppress tremor in most patients.  Plan : repeat seizure montage sleep study, prefer Beau as technologist. BCBS criteria- 3% ,  split at AHI 20.   He reports EDS but not sleep paralysis, not cataplectic attacks.     Larey Seat, MD    [facility] [place], [place] [address] Phone: [phone] Fax:      [phone]  CC: Prince Solian, MD

## 2017-12-29 ENCOUNTER — Encounter: Payer: Self-pay | Admitting: Neurology

## 2018-01-03 ENCOUNTER — Telehealth: Payer: Self-pay | Admitting: *Deleted

## 2018-01-03 NOTE — Telephone Encounter (Addendum)
I spoke to the pharmacy and they do not have patient's [place] information on file. His last prescription was processed through [facility] and his co-pay assistance card.   I called the patient and spoke to him.  He is Hospital doctor to [place] and his Vimpat will require a PA.  He is going to contact me back with his new policy number and [employer]'s contact information.  He currently has Vimpat at home and will call me with his updated plan so that the PA can be completed prior to him running out of medication.    Received email from patient:  Dr Krista Blue,  Please provide info below about Vimpat,  Thanks!  Morrie Sheldon [birth year]  From: Trey Paula [[email or web address]]   Subject: RE: Prescriptions with Christella Scheuermann  We checked with [employer] regarding your medications and here is their response:  Vimpat 150 mg - Tier 3 Non Preferred Brand *Prior authorization is required for this drug to be covered    Authorization    I would recommend that you fill the vimpat and omeprazole prior to 01-09-18 so you won't be without medication while waiting for an authorization or for a new script to be filled.    I've attached a 2019 drug formulary and also a general prior authorization form for you to take to your doctors to review. Let us know if you need anything else.    Trey Paula      [address]  [address]  [place], [place] [address]    P: [phone]  F: [phone]  [email or web address]  [email or web address]

## 2018-01-17 NOTE — Telephone Encounter (Signed)
I have completed and submitted the PA for Vimpat on Cover My Meds and should have a determination within 48-72 hours.  Cover My Meds Key: [id number] - PA Case ID: [id number] - Rx #: [id number]

## 2018-01-17 NOTE — Telephone Encounter (Signed)
[employer] open access Plus: ID# [id number]  grp # [id number]  rx bin# [id number]  rx grp# [id number]

## 2018-01-18 NOTE — Telephone Encounter (Signed)
PA Case: [id number], Status: Approved, Coverage Starts on: 01/17/2018 12:00:00 AM, Coverage Ends on: 01/17/2019 12:00:00 AM.

## 2018-02-19 ENCOUNTER — Other Ambulatory Visit: Payer: Self-pay | Admitting: Neurology

## 2018-02-27 ENCOUNTER — Ambulatory Visit: Payer: BLUE CROSS/BLUE SHIELD | Admitting: Neurology

## 2018-03-02 ENCOUNTER — Encounter: Payer: Self-pay | Admitting: Internal Medicine

## 2018-03-02 ENCOUNTER — Ambulatory Visit: Payer: BLUE CROSS/BLUE SHIELD | Admitting: Internal Medicine

## 2018-03-02 VITALS — BP 134/86 | HR 64 | Ht 70.25 in | Wt 250.0 lb

## 2018-03-02 DIAGNOSIS — Z86010 Personal history of colonic polyps: Secondary | ICD-10-CM

## 2018-03-02 DIAGNOSIS — R1013 Epigastric pain: Secondary | ICD-10-CM | POA: Diagnosis not present

## 2018-03-02 DIAGNOSIS — K219 Gastro-esophageal reflux disease without esophagitis: Secondary | ICD-10-CM | POA: Diagnosis not present

## 2018-03-02 DIAGNOSIS — R109 Unspecified abdominal pain: Secondary | ICD-10-CM

## 2018-03-02 DIAGNOSIS — R195 Other fecal abnormalities: Secondary | ICD-10-CM | POA: Diagnosis not present

## 2018-03-02 MED ORDER — NA SULFATE-K SULFATE-MG SULF 17.5-3.13-1.6 GM/177ML PO SOLN: 1.0000 | Freq: Once | ORAL | 0 refills | Status: AC

## 2018-03-02 NOTE — Patient Instructions (Signed)

## 2018-03-02 NOTE — Progress Notes (Signed)
HISTORY OF PRESENT ILLNESS:  Samuel Jacobs is a 67 y.o. 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 He is sent today by his primary care provider Dr. Dagmar Jacobs regarding multiple heme positive stools. I last saw the patient January 2018 regarding GERD refractory to twice daily PPI. See that dictation. We added ranitidine at night. This did not help. We stressed weight loss. He has not. He continues to have trouble with breakthrough reflux symptoms, almost exclusively at night. He takes antacids which helped. He does elevate his bed with low-dose. Twice a day PPI is clearly better than once daily or no PPI. No dysphagia. He also reports intermittent problems with postprandial epigastric discomfort. No bleeding. He does take NSAIDs. He continues on chronic narcotics. No lower GI complaints. Recently had cardiology evaluation for atypical chest pain. Review of outside records shows Hemoccult-positive stool times 01/11/2018. Last colonoscopy February 2017 with tubular adenomas. Adenomas in 2009 elsewhere. Moderate diverticulosis. Last upper endoscopy prior to paraganglioma resection also February 2017  REVIEW OF SYSTEMS:  All non-GI ROS negative except for anxiety back pain depression fatigue sinus allergy sleeping problems swelling of the legs excessive urination shortness of breath  Past Medical History:  Diagnosis Date  . Allergic rhinitis   . Allergy   . Anxiety   . Arthritis   . Carpal tunnel syndrome    09-12-15 no longer a problem.  . Colon polyps   . Complication of anesthesia    one time awaken- not with last procedure done.  . Depression   . ED (erectile dysfunction)   . GERD (gastroesophageal reflux disease)   . HLD (hyperlipidemia)   . Insomnia   . Seizure ([place])    08-13-15 per pt his last seizure was "about 1 year ago".  maw  . Sleep apnea    pt does not wear c-pap    Past Surgical History:  Procedure Laterality Date  .  CHOLECYSTECTOMY  11/07/2015  . COLONOSCOPY    . EUS N/A 09/18/2015   Procedure: UPPER ENDOSCOPIC ULTRASOUND (EUS) LINEAR;  Surgeon: Milus Banister, MD;  Location: [facility];  Service: Endoscopy;  Laterality: N/A;  . TONSILLECTOMY    . 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  Marland Kitchen UPPER GASTROINTESTINAL ENDOSCOPY    . [place] EXTRACTION      Social History Samuel Jacobs  reports that he has never smoked. He has never used smokeless tobacco. He reports that he does not drink alcohol or use drugs.  family history includes Breast cancer in his mother; Cancer in his maternal grandfather, maternal grandmother, paternal grandfather, and paternal grandmother; Epilepsy in his sister; Stomach cancer in his father.  Allergies  Allergen Reactions  . Sulfur Nausea Only       PHYSICAL EXAMINATION: Vital signs: BP 134/86 (BP Location: Left Arm, Patient Position: Sitting, Cuff Size: Normal)   Pulse 64   Ht 5' 10.25" (1.784 m)   Wt 250 lb (113.4 kg)   BMI 35.62 kg/m   Constitutional: pleasant,generally well-appearing, no acute distress Psychiatric: alert and oriented x3, cooperative Eyes: extraocular movements intact, anicteric, conjunctiva pink Mouth: oral pharynx moist, no lesions Neck: supple no lymphadenopathy Cardiovascular: heart regular rate and rhythm, no murmur Lungs: clear to auscultation bilaterally Abdomen: soft,obese, nontender, nondistended, no obvious ascites, no peritoneal signs, normal bowel sounds, no organomegaly Rectal:deferred until colonoscopy Extremities: no lower extremity edema bilaterally Skin: no lesions on visible extremities Neuro: No focal deficits.  Normal DTRs. No asterixis.   ASSESSMENT:  #1. Chronic GERD. Nocturnal symptoms despite twice a day PPI. I suspect his nocturnal symptoms are due to obesity. As well, he may have an element of esophageal dysmotility secondary to his chronic narcotics which reduces acid clearance from the  esophagus. #2. Postprandial epigastric discomfort. Along with Hemoccult-positive stools and chronic NSAID use rule out ulcer #3. 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 History of adenomatous colon polyps. Last colonoscopy February 2017 #5. Hemoccult-positive stool. Rule out GI mucosal lesion #6. Obesity  PLAN:  #1. Colonoscopy.The nature of the procedure, as well as the risks, benefits, and alternatives were carefully and thoroughly reviewed with the patient. Ample time for discussion and questions allowed. The patient understood, was satisfied, and agreed to proceed. #2. Upper endoscopy.The nature of the procedure, as well as the risks, benefits, and alternatives were carefully and thoroughly reviewed with the patient. Ample time for discussion and questions allowed. The patient understood, was satisfied, and agreed to proceed. #3. Reflux precautions with attention to weight loss, avoiding large meals, avoiding meals within 4 hours of bedtime #4. Continue twice a day PPI #5. Continue on demand and acid as this helps   A copy of this note has been sent to Dr. Dagmar Jacobs

## 2018-04-06 ENCOUNTER — Telehealth: Payer: Self-pay | Admitting: *Deleted

## 2018-04-06 NOTE — Telephone Encounter (Signed)
Opened in error

## 2018-04-10 ENCOUNTER — Encounter: Payer: Self-pay | Admitting: Internal Medicine

## 2018-04-10 ENCOUNTER — Ambulatory Visit (AMBULATORY_SURGERY_CENTER): Payer: Managed Care, Other (non HMO) | Admitting: Internal Medicine

## 2018-04-10 VITALS — BP 129/75 | HR 56 | Temp 99.3°F | Resp 13 | Ht 70.0 in | Wt 250.0 lb

## 2018-04-10 DIAGNOSIS — Z860100 Personal history of colon polyps, unspecified: Secondary | ICD-10-CM

## 2018-04-10 DIAGNOSIS — K317 Polyp of stomach and duodenum: Secondary | ICD-10-CM

## 2018-04-10 DIAGNOSIS — R195 Other fecal abnormalities: Secondary | ICD-10-CM | POA: Diagnosis present

## 2018-04-10 DIAGNOSIS — K219 Gastro-esophageal reflux disease without esophagitis: Secondary | ICD-10-CM | POA: Diagnosis not present

## 2018-04-10 MED ORDER — SODIUM CHLORIDE 0.9 % IV SOLN: 500.0000 mL | Freq: Once | INTRAVENOUS | Status: DC

## 2018-04-10 NOTE — Progress Notes (Signed)
A and O x3. Report to RN. Tolerated MAC anesthesia well.Teeth unchanged after procedure.

## 2018-04-10 NOTE — Op Note (Signed)
[facility] Patient Name: Samuel Jacobs Procedure Date: 04/10/2018 3:11 PM MRN: [id number] Endoscopist: Docia Chuck. Henrene Pastor , MD Age: 67 Referring MD:  Date of Birth: 10-26-1950 Gender: Male Account #: [id number] Procedure:                Colonoscopy Indications:              Abdominal pain, Heme positive stool. history of                            adenomatous colon polyps. Last colonoscopy 2017 Medicines:                Monitored Anesthesia Care Procedure:                Pre-Anesthesia Assessment:                           - Prior to the procedure, a History and Physical                            was performed, and patient medications and                            allergies were reviewed. The patient's tolerance of                            previous anesthesia was also reviewed. The risks                            and benefits of the procedure and the sedation                            options and risks were discussed with the patient.                            All questions were answered, and informed consent                            was obtained. Prior Anticoagulants: The patient has                            taken no previous anticoagulant or antiplatelet                            agents. ASA Grade Assessment: II - A patient with                            mild systemic disease. After reviewing the risks                            and benefits, the patient was deemed in                            satisfactory condition to undergo the procedure.  After obtaining informed consent, the colonoscope                            was passed under direct vision. Throughout the                            procedure, the patient's blood pressure, pulse, and                            oxygen saturations were monitored continuously. The                            Colonoscope was introduced through the anus and                            advanced to the the  cecum, identified by                            appendiceal orifice and ileocecal valve. The                            ileocecal valve, appendiceal orifice, and rectum                            were photographed. The quality of the bowel                            preparation was good. The colonoscopy was performed                            without difficulty. The patient tolerated the                            procedure well. The bowel preparation used was                            SUPREP. Scope In: 3:28:24 PM Scope Out: 3:43:30 PM Scope Withdrawal Time: 0 hours 12 minutes 11 seconds  Total Procedure Duration: 0 hours 15 minutes 6 seconds  Findings:                 Multiple diverticula were found in the left colon.                           The exam was otherwise without abnormality on                            direct and retroflexion views. Internal hemorrhoids                            present. Complications:            No immediate complications. Estimated blood loss:                            None. Estimated Blood  Loss:     Estimated blood loss: none. Impression:               - Diverticulosis in the left colon.                           - The examination was otherwise normal on direct                            and retroflexion views. Internal hemorrhoids                            present.                           - No specimens collected. Recommendation:           - Repeat colonoscopy in 5 years for surveillance,                            given prior history of adenomatous colon polyps.                           - Patient has a contact number available for                            emergencies. The signs and symptoms of potential                            delayed complications were discussed with the                            patient. Return to normal activities tomorrow.                            Written discharge instructions were provided to the                             patient.                           - Resume previous diet.                           - Continue present medications. Docia Chuck. Henrene Pastor, MD 04/10/2018 3:47:32 PM This report has been signed electronically.

## 2018-04-10 NOTE — Patient Instructions (Signed)
YOU HAD AN ENDOSCOPIC PROCEDURE TODAY AT [facility]:   Refer to the procedure report that was given to you for any specific questions about what was found during the examination.  If the procedure report does not answer your questions, please call your gastroenterologist to clarify.  If you requested that your care partner not be given the details of your procedure findings, then the procedure report has been included in a sealed envelope for you to review at your convenience later.  YOU SHOULD EXPECT: Some feelings of bloating in the abdomen. Passage of more gas than usual.  Walking can help get rid of the air that was put into your GI tract during the procedure and reduce the bloating. If you had a lower endoscopy (such as a colonoscopy or flexible sigmoidoscopy) you may notice spotting of blood in your stool or on the toilet paper. If you underwent a bowel prep for your procedure, you may not have a normal bowel movement for a few days.  Please Note:  You might notice some irritation and congestion in your nose or some drainage.  This is from the oxygen used during your procedure.  There is no need for concern and it should clear up in a day or so.  SYMPTOMS TO REPORT IMMEDIATELY:   Following lower endoscopy (colonoscopy or flexible sigmoidoscopy):  Excessive amounts of blood in the stool  Significant tenderness or worsening of abdominal pains  Swelling of the abdomen that is new, acute  Fever of 100F or higher   Following upper endoscopy (EGD)  Vomiting of blood or coffee ground material  New chest pain or pain under the shoulder blades  Painful or persistently difficult swallowing  New shortness of breath  Fever of 100F or higher  Black, tarry-looking stools  For urgent or emergent issues, a gastroenterologist can be reached at any hour by calling [phone].   DIET:  We do recommend a small meal at first, but then you may proceed to your regular diet.  Drink  plenty of fluids but you should avoid alcoholic beverages for 24 hours.  ACTIVITY:  You should plan to take it easy for the rest of today and you should NOT DRIVE or use heavy machinery until tomorrow (because of the sedation medicines used during the test).    FOLLOW UP: Our staff will call the number listed on your records the next business day following your procedure to check on you and address any questions or concerns that you may have regarding the information given to you following your procedure. If we do not reach you, we will leave a message.  However, if you are feeling well and you are not experiencing any problems, there is no need to return our call.  We will assume that you have returned to your regular daily activities without incident.  If any biopsies were taken you will be contacted by phone or by letter within the next 1-3 weeks.  Please call us at [phone] if you have not heard about the biopsies in 3 weeks.   Await for biopsy results Hemorrhoid (handout given) Diverticulosis (handout given) Repeat next screening Colonoscopy in 5 years  SIGNATURES/CONFIDENTIALITY: You and/or your care partner have signed paperwork which will be entered into your electronic medical record.  These signatures attest to the fact that that the information above on your After Visit Summary has been reviewed and is understood.  Full responsibility of the confidentiality of this discharge information lies  with you and/or your care-partner.

## 2018-04-10 NOTE — Progress Notes (Signed)
Called to room to assist during endoscopic procedure.  Patient ID and intended procedure confirmed with present staff. Received instructions for my participation in the procedure from the performing physician.  

## 2018-04-10 NOTE — Op Note (Signed)
[facility] Patient Name: Samuel Jacobs Procedure Date: 04/10/2018 3:11 PM MRN: [id number] Endoscopist: Docia Chuck. Henrene Pastor , MD Age: 67 Referring MD:  Date of Birth: 1950-07-15 Gender: Male Account #: [id number] Procedure:                Upper GI endoscopy with biopsies Indications:              Abdominal pain, Dyspepsia, Esophageal reflux, Heme                            positive stool Medicines:                Monitored Anesthesia Care Procedure:                Pre-Anesthesia Assessment:                           - Prior to the procedure, a History and Physical                            was performed, and patient medications and                            allergies were reviewed. The patient's tolerance of                            previous anesthesia was also reviewed. The risks                            and benefits of the procedure and the sedation                            options and risks were discussed with the patient.                            All questions were answered, and informed consent                            was obtained. Prior Anticoagulants: The patient has                            taken no previous anticoagulant or antiplatelet                            agents. ASA Grade Assessment: II - A patient with                            mild systemic disease. After reviewing the risks                            and benefits, the patient was deemed in                            satisfactory condition to undergo the procedure.  After obtaining informed consent, the endoscope was                            passed under direct vision. Throughout the                            procedure, the patient's blood pressure, pulse, and                            oxygen saturations were monitored continuously. The                            Endoscope was introduced through the mouth, and                            advanced to the second part of  duodenum. The upper                            GI endoscopy was accomplished without difficulty.                            The patient tolerated the procedure well. Scope In: Scope Out: Findings:                 The esophagus was normal.                           Multiple pedunculated polyps were found in the                            gastric body measuring up to 1 cm. These were                            consistent with benign fundic gland type polyps.                            Biopsies were taken from several representatives                            with a cold forceps for histology.                           The stomach was otherwise normal.                           The examined duodenum was normal with evidence of                            prior surgery.                           The cardia and gastric fundus were normal on                            retroflexion. Complications:  No immediate complications. Estimated Blood Loss:     Estimated blood loss: none. Impression:               - Normal esophagus.                           - Multiple gastric polyps. Biopsied.                           - Normal stomach otherwise.                           - Normal examined duodenum with evidence of prior                            surgery. Recommendation:           - Patient has a contact number available for                            emergencies. The signs and symptoms of potential                            delayed complications were discussed with the                            patient. Return to normal activities tomorrow.                            Written discharge instructions were provided to the                            patient.                           - Resume previous diet.                           - Continue present medications.                           - Await pathology results. Docia Chuck. Henrene Pastor, MD 04/10/2018 3:56:44 PM This report has been signed  electronically.

## 2018-04-11 ENCOUNTER — Telehealth: Payer: Self-pay

## 2018-04-11 ENCOUNTER — Telehealth: Payer: Self-pay | Admitting: *Deleted

## 2018-04-11 NOTE — Telephone Encounter (Signed)
First follow up call attempt.  Voicemail with first name identified.  Message left to call if any questions or concerns.

## 2018-04-11 NOTE — Telephone Encounter (Signed)
Second post procedure follow up call, no answer 

## 2018-04-15 ENCOUNTER — Other Ambulatory Visit: Payer: Self-pay | Admitting: Neurology

## 2018-04-18 ENCOUNTER — Encounter: Payer: Self-pay | Admitting: Internal Medicine

## 2018-04-26 ENCOUNTER — Encounter: Payer: Self-pay | Admitting: Neurology

## 2018-04-26 ENCOUNTER — Ambulatory Visit: Payer: Managed Care, Other (non HMO) | Admitting: Neurology

## 2018-04-26 VITALS — BP 129/78 | HR 58 | Ht 70.0 in | Wt 253.0 lb

## 2018-04-26 DIAGNOSIS — G40109 Localization-related (focal) (partial) symptomatic epilepsy and epileptic syndromes with simple partial seizures, not intractable, without status epilepticus: Secondary | ICD-10-CM

## 2018-04-26 DIAGNOSIS — G4700 Insomnia, unspecified: Secondary | ICD-10-CM | POA: Diagnosis not present

## 2018-04-26 MED ORDER — LACOSAMIDE 150 MG PO TABS: 1.0000 | ORAL_TABLET | Freq: Two times a day (BID) | ORAL | 4 refills | Status: DC

## 2018-04-26 MED ORDER — TRAZODONE HCL 100 MG PO TABS: 100.0000 mg | ORAL_TABLET | Freq: Every day | ORAL | 11 refills | Status: DC

## 2018-04-26 NOTE — Progress Notes (Addendum)
PATIENT: Samuel Jacobs DOB: [birth year]  REASON FOR VISIT: follow up HISTORY FROM: patient  HISTORY OF PRESENT ILLNESS: HISTORY 08/21/15 (Per Dr. Krista Blue notes): [name] is a 67 year old male with a history of complex partial seizure seizure He continue have recurrent seizures while taking Depakote, eventually Vimpat was added on in 2015, Depakote was weaned off, while taking Vimpat 150 mg twice a day, he denies any seizure event. 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 He complains of dizziness, feeling funny with higher dose of Vimpat Previous sleep study in 2011 showed mild obstructive sleep apnea, CPAP was not recommended at that time. He has quit tramadol, he is now taking hydrocodone for low back pain. No longer has frequent headaches, was to go back on lower dose of Vimpat  Last seizure was in 2015, his seizure consistent of staring, confusion. He has difficulty falling to sleep due to his shift job, tried over-the-counter Tylenol PM without benefit, he also complains of depression anxiety  Update February 21 2017: He is taking Vimpat 150 twice a day, there was no recurrent seizure activity, he is driving, trazodone 220 mg for sleep,  UPDATE Apr 26 2018: He is doing very well with Vimpat 150 mg twice a day, there was no recurrent seizure, continue trazodone.  I reviewed the laboratory from [name] in July 2019, normal CMP creatinine of 1.0, hemoglobin 15.2,  REVIEW OF SYSTEMS: Out of a complete 14 system review of symptoms, the patient complains only of the following symptoms, and all other reviewed systems are  negative. Fatigue, joint pain, low back pain, daytime sleepiness, tremor, depression anxiety  ALLERGIES: Allergies  Allergen Reactions  . Sulfur Nausea Only    HOME MEDICATIONS: Outpatient Medications Prior to Visit  Medication Sig Dispense Refill  . atorvastatin (LIPITOR) 10 MG tablet Take 10 mg by mouth daily.  2  . b complex vitamins tablet Take 1 tablet by mouth daily.    . Cholecalciferol (VITAMIN D PO) Take 1 tablet by mouth daily.    . fluticasone (FLONASE) 50 MCG/ACT nasal spray Place 1 spray into both nostrils daily as needed for allergies. Reported on 08/13/2015    . HYDROcodone-acetaminophen (NORCO/VICODIN) 5-325 MG tablet Take 1 tablet by mouth every 8 (eight) hours as needed for moderate pain.   0  . irbesartan (AVAPRO) 300 MG tablet TAKE 1 TABLET BY MOUTH DAILY *INCREASED DOSE*  6  . meloxicam (MOBIC) 15 MG tablet TAKE 1 TABLET BY MOUTH EVERY DAY 30 tablet 0  . omeprazole (PRILOSEC) 40 MG capsule Take 40 mg by mouth 2 (two) times daily.  3  . pantoprazole (PROTONIX) 40 MG tablet Take 40 mg by mouth daily.  4  . traZODone (DESYREL) 100 MG tablet TAKE 1 TABLET BY MOUTH EVERY NIGHT AT BEDTIME 30 tablet 0  . VIMPAT 150 MG TABS TAKE 1 TABLET BY MOUTH TWICE A DAY 60 tablet 5  . metoprolol succinate (TOPROL-XL) 25 MG 24 hr tablet Take 25 mg by mouth daily.  11  . nitroGLYCERIN (NITROSTAT) 0.4 MG SL tablet DISSOLVE 1 TABLET UNDER TONGUE EVERY 5 MINUTES FOR 3 DOSES FOR UNRESOLVED CHEST PAIN  6  Facility-Administered Medications Prior to Visit  Medication Dose Route Frequency Provider Last Rate Last Dose  . 0.9 %  sodium chloride infusion  500 mL Intravenous Once Irene Shipper, MD        PAST MEDICAL HISTORY: Past Medical History:  Diagnosis Date  . Allergic rhinitis   . Allergy   . Anxiety   . Arthritis   . Carpal tunnel syndrome    09-12-15 no longer a problem.  . Colon polyps   . Complication of anesthesia    one time awaken- not with last procedure done.  . Depression    . ED (erectile dysfunction)   . GERD (gastroesophageal reflux disease)   . HLD (hyperlipidemia)   . Insomnia   . Seizure ([place])    08-13-15 per pt his last seizure was "about 1 year ago".  maw  . Sleep apnea    pt does not wear c-pap    PAST SURGICAL HISTORY: Past Surgical History:  Procedure Laterality Date  . CHOLECYSTECTOMY  11/07/2015  . COLONOSCOPY    . EUS N/A 09/18/2015   Procedure: UPPER ENDOSCOPIC ULTRASOUND (EUS) LINEAR;  Surgeon: Milus Banister, MD;  Location: [facility];  Service: Endoscopy;  Laterality: N/A;  . TONSILLECTOMY    . 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  Marland Kitchen UPPER GASTROINTESTINAL ENDOSCOPY    . WISDOM TOOTH EXTRACTION      FAMILY HISTORY: Family History  Problem Relation Age of Onset  . Breast cancer Mother        mets to liver  . Stomach cancer Father   . Epilepsy Sister   . Cancer Maternal Grandmother        type unknown  . Cancer Maternal Grandfather        type unknown  . Cancer Paternal Grandfather        type unknown  . Cancer Paternal Grandmother        type unknown  . Colon cancer Neg Hx   . Esophageal cancer Neg Hx   . Rectal cancer Neg Hx   . Prostate cancer Neg Hx     SOCIAL HISTORY: Social History   Socioeconomic History  . Marital status: Married    Spouse name: Gailen Shelter  . Number of children: 3  . Years of education: college  . Highest education level: Not on file  Occupational History  . Occupation: semi-retired  Social Needs  . Financial resource strain: Not on file  . Food insecurity:    Worry: Not on file    Inability: Not on file  . Transportation needs:    Medical: Not on file    Non-medical: Not on file  Tobacco Use  . Smoking status: Never Smoker  . Smokeless tobacco: Never Used  Substance and Sexual Activity  . Alcohol use: No    Alcohol/week: 0.0 standard drinks    Comment: past -none in 7-8 yrs social only  . Drug use: No  . Sexual activity: Not on file   Lifestyle  . Physical activity:    Days per week: Not on file    Minutes per session: Not on file  . Stress: Not on file  Relationships  . Social connections:    Talks on phone: Not on file    Gets together: Not on file    Attends religious service: Not on file    Active member of club or organization: Not on file    Attends meetings of clubs or  organizations: Not on file    Relationship status: Not on file  . Intimate partner violence:    Fear of current or ex partner: Not on file    Emotionally abused: Not on file    Physically abused: Not on file    Forced sexual activity: Not on file  Other Topics Concern  . Not on file  Social History Narrative   Patient lives at home with wife Neoma Laming.    Patient has retired and works part time at [employer]   Patient has 3 children.    Patient has a college education.   Patient is right handed.      PHYSICAL EXAM  Vitals:   04/26/18 0830  BP: 129/78  Pulse: (!) 58  Weight: 253 lb (114.8 kg)  Height: 5\' 10"  (1.778 m)   Body mass index is 36.3 kg/m.  Generalized: Well developed, in no acute distress   Neurological examination  Mentation: Alert oriented to time, place, history taking. Follows all commands speech and language fluent Cranial nerve II-XII: Pupils were equal round reactive to light. Extraocular movements were full, visual field were full on confrontational test. Facial sensation and strength were normal. Uvula tongue midline. Head turning and shoulder shrug  were normal and symmetric. Motor: The motor testing reveals 5 over 5 strength of all 4 extremities. Good symmetric motor tone is noted throughout.  Sensory: Sensory testing is intact to soft touch on all 4 extremities. No evidence of extinction is noted.  Coordination: Cerebellar testing reveals good finger-nose-finger and heel-to-shin bilaterally. Mild intention tremor on the left. Gait and station: Gait is normal. Tandem gait is unsteady.. Romberg is  negative. No drift is seen.  Reflexes: Deep tendon reflexes are symmetric and normal bilaterally.   DIAGNOSTIC DATA (LABS, IMAGING, TESTING) - I reviewed patient records, labs, notes, testing and imaging myself where available.  Lab Results  Component Value Date   WBC 7.7 08/23/2016   HGB 14.4 08/23/2016   HCT 42.6 08/23/2016   MCV 86 08/23/2016   PLT 295 08/23/2016      Component Value Date/Time   NA 138 04/15/2015 0658   NA 134 05/02/2014 1139   K 4.7 04/15/2015 0658   CL 103 04/15/2015 0658   CO2 27 04/15/2015 0658   GLUCOSE 150 (H) 04/15/2015 0658   BUN 18 04/15/2015 0658   BUN 16 05/02/2014 1139   CREATININE 1.20 09/05/2015 0822   CALCIUM 9.4 04/15/2015 0658   PROT 7.3 08/23/2016 0836   ALBUMIN 4.4 08/23/2016 0836   AST 24 08/23/2016 0836   ALT 25 08/23/2016 0836   ALKPHOS 63 08/23/2016 0836   BILITOT 0.3 08/23/2016 0836   GFRNONAA >60 04/15/2015 0658   GFRAA >60 04/15/2015 0658      ASSESSMENT AND PLAN 67 y.o. year old male   Epilepsy:  Last seizure was in 2015.  Refilled his Vimpat 150mg  bid   Chronic Insomnia:  Refill trazadone  Marcial Pacas, M.D. Ph.D.  [facility] [place], [place] [address] Phone: [phone] Fax:      [phone]  CC: Prince Solian, MD

## 2018-05-25 DIAGNOSIS — M79676 Pain in unspecified toe(s): Secondary | ICD-10-CM

## 2018-05-29 ENCOUNTER — Other Ambulatory Visit: Payer: Self-pay | Admitting: Internal Medicine

## 2018-05-29 DIAGNOSIS — R042 Hemoptysis: Secondary | ICD-10-CM

## 2018-06-06 ENCOUNTER — Ambulatory Visit
Admission: RE | Admit: 2018-06-06 | Discharge: 2018-06-06 | Disposition: A | Discharge status: Home / Self Care | Payer: Managed Care, Other (non HMO) | Source: Ambulatory Visit | Attending: Internal Medicine | Admitting: Internal Medicine

## 2018-06-06 DIAGNOSIS — R042 Hemoptysis: Secondary | ICD-10-CM

## 2018-06-06 MED ORDER — IOHEXOL 300 MG/ML  SOLN
75.0000 mL | Freq: Once | INTRAMUSCULAR | Status: AC | PRN
  Administered 2018-06-06: 75 mL via INTRAVENOUS

## 2018-06-29 ENCOUNTER — Other Ambulatory Visit: Payer: Self-pay | Admitting: Neurology

## 2018-07-10 ENCOUNTER — Institutional Professional Consult (permissible substitution): Payer: Managed Care, Other (non HMO) | Admitting: Internal Medicine

## 2018-07-20 ENCOUNTER — Encounter: Payer: Self-pay | Admitting: Emergency Medicine

## 2018-07-20 ENCOUNTER — Ambulatory Visit: Payer: Managed Care, Other (non HMO) | Admitting: Emergency Medicine

## 2018-07-20 DIAGNOSIS — R0600 Dyspnea, unspecified: Secondary | ICD-10-CM | POA: Insufficient documentation

## 2018-07-20 DIAGNOSIS — R0602 Shortness of breath: Secondary | ICD-10-CM

## 2018-07-20 DIAGNOSIS — R042 Hemoptysis: Secondary | ICD-10-CM | POA: Insufficient documentation

## 2018-07-20 NOTE — H&P (View-Only) (Signed)
Subjective:    Patient ID: Samuel Jacobs, male    DOB: [birth year], 68 y.o.   MRN: [id number]  HPI 68 year old never smoker with a history of hypertension, hyperlipidemia, allergic rhinitis GERD, seizures, sleep apnea not on CPAP.  He is referred today for evaluation of hemoptysis.  He reports that he has been seeing blood in some of his sputum, streaks in his phlegm, for about 6 months. Mostly in the am, but can happen during the day also. He has seen some epistaxis as well, some crusting blood. Sometimes hears wheeze at night. Can lose his voice in the evening. Still has GERD sx, on protonix bid.   He has had some exercise chest discomfort - saw Dr Nadyne Coombes and had a reassuring cardiac stress test  A CT chest was done on 11/26 last 19 that I have reviewed.  This shows some faintly calcified granulomas scattered throughout both lung fields.  He also has some calcified left hilar lymphadenopathy.  No suspicious lung nodules or infiltrates seen.    Review of Systems  Constitutional: Negative for fever and unexpected weight change.  HENT: Negative for congestion, dental problem, ear pain, nosebleeds, postnasal drip, rhinorrhea, sinus pressure, sneezing, sore throat and trouble swallowing.   Eyes: Negative for redness and itching.  Respiratory: Positive for cough, chest tightness, shortness of breath and wheezing.   Cardiovascular: Negative for palpitations and leg swelling.  Gastrointestinal: Negative for nausea and vomiting.  Genitourinary: Negative for dysuria.  Musculoskeletal: Negative for joint swelling.  Skin: Negative for rash.  Neurological: Negative for headaches.  Hematological: Does not bruise/bleed easily.  Psychiatric/Behavioral: Negative for dysphoric mood. The patient is not nervous/anxious.     Past Medical History:  Diagnosis Date  . Allergic rhinitis   . Allergy   . Anxiety   . Arthritis   . Carpal tunnel syndrome    09-12-15 no longer a problem.  . Colon polyps   .  Complication of anesthesia    one time awaken- not with last procedure done.  . Depression   . ED (erectile dysfunction)   . GERD (gastroesophageal reflux disease)   . HLD (hyperlipidemia)   . Insomnia   . Seizure ([place])    08-13-15 per pt his last seizure was "about 1 year ago".  maw  . Sleep apnea    pt does not wear c-pap     Family History  Problem Relation Age of Onset  . Breast cancer Mother        mets to liver  . Stomach cancer Father   . Epilepsy Sister   . Cancer Maternal Grandmother        type unknown  . Cancer Maternal Grandfather        type unknown  . Cancer Paternal Grandfather        type unknown  . Cancer Paternal Grandmother        type unknown  . Colon cancer Neg Hx   . Esophageal cancer Neg Hx   . Rectal cancer Neg Hx   . Prostate cancer Neg Hx      Social History   Socioeconomic History  . Marital status: Married    Spouse name: Gailen Shelter  . Number of children: 3  . Years of education: college  . Highest education level: Not on file  Occupational History  . Occupation: semi-retired  Social Needs  . Financial resource strain: Not on file  . Food insecurity:    Worry: Not on file  Inability: Not on file  . Transportation needs:    Medical: Not on file    Non-medical: Not on file  Tobacco Use  . Smoking status: Never Smoker  . Smokeless tobacco: Never Used  Substance and Sexual Activity  . Alcohol use: No    Alcohol/week: 0.0 standard drinks    Comment: past -none in 7-8 yrs social only  . Drug use: No  . Sexual activity: Not on file  Lifestyle  . Physical activity:    Days per week: Not on file    Minutes per session: Not on file  . Stress: Not on file  Relationships  . Social connections:    Talks on phone: Not on file    Gets together: Not on file    Attends religious service: Not on file    Active member of club or organization: Not on file    Attends meetings of clubs or organizations: Not on file    Relationship status:  Not on file  . Intimate partner violence:    Fear of current or ex partner: Not on file    Emotionally abused: Not on file    Physically abused: Not on file    Forced sexual activity: Not on file  Other Topics Concern  . Not on file  Social History Narrative   Patient lives at home with wife Neoma Laming.    Patient has retired and works part time at [employer]   Patient has 3 children.    Patient has a college education.   Patient is right handed.  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  No military  No known TB or silica exposure   Allergies  Allergen Reactions  . Sulfur Nausea Only     Outpatient Medications Prior to Visit  Medication Sig Dispense Refill  . atorvastatin (LIPITOR) 10 MG tablet Take 10 mg by mouth daily.  2  . b complex vitamins tablet Take 1 tablet by mouth daily.    . Cholecalciferol (VITAMIN D PO) Take 1 tablet by mouth daily.    . fluticasone (FLONASE) 50 MCG/ACT nasal spray Place 1 spray into both nostrils daily as needed for allergies. Reported on 08/13/2015    . HYDROcodone-acetaminophen (NORCO/VICODIN) 5-325 MG tablet Take 1 tablet by mouth every 8 (eight) hours as needed for moderate pain.   0  . irbesartan (AVAPRO) 300 MG tablet TAKE 1 TABLET BY MOUTH DAILY *INCREASED DOSE*  6  . Lacosamide (VIMPAT) 150 MG TABS Take 1 tablet (150 mg total) by mouth 2 (two) times daily. 180 tablet 4  . meloxicam (MOBIC) 15 MG tablet TAKE 1 TABLET BY MOUTH EVERY DAY 30 tablet 0  . pantoprazole (PROTONIX) 40 MG tablet Take 40 mg by mouth 2 (two) times daily.  4  . traZODone (DESYREL) 100 MG tablet TAKE 1 TABLET BY MOUTH EVERYDAY AT BEDTIME 90 tablet 3  . omeprazole (PRILOSEC) 40 MG capsule Take 40 mg by mouth 2 (two) times daily.  3   Facility-Administered Medications Prior to Visit  Medication Dose Route Frequency Provider Last Rate Last Dose  . 0.9 %  sodium chloride infusion  500 mL Intravenous Once Irene Shipper, MD             Objective:   Physical Exam Vitals:   07/20/18 1451  BP: 118/82  Pulse: 72  SpO2: 98%  Weight: 259 lb (117.5 kg)  Height: 5\' 11"  (1.803 m)  Gen: Pleasant, well-nourished, in no distress,  normal affect  ENT: No lesions,  mouth clear,  oropharynx clear, some irritated nasal mucosa right greater than left with small amount of blood  Neck: No JVD, no stridor  Lungs: No use of accessory muscles, no crackles or wheezing on normal respiration, no wheeze on forced expiration  Cardiovascular: RRR, heart sounds normal, no murmur or gallops, trace peripheral edema  Musculoskeletal: No deformities, no cyanosis or clubbing  Neuro: alert, awake, non focal  Skin: Warm, no lesions or rash      Assessment & Plan:  Hemoptysis Etiology unclear.  He does have a reassuring CT chest with some calcified granulomas and some calcified left hilar lymphadenopathy, question exposure to histo in the past, consider fibrosing mediastinitis.  I think needs bronchoscopy for airway inspection.  I will check a screening ACE level, QuantiFERON gold although he denies any TB exposure.  Need to try to aggressively treat nasal irritation, GERD.  Dyspnea He has had a reassuring cardiac evaluation.  I will arrange for pulmonary function testing, follow with him in 1 month  Baltazar Apo, MD, PhD 07/20/2018, 3:36 PM [facility] [phone] or if no answer [phone]

## 2018-07-20 NOTE — Progress Notes (Signed)
Subjective:    Patient ID: Samuel Jacobs, male    DOB: Nov 06, 1950, 68 y.o.   MRN: [id number]  HPI 68 year old never smoker with a history of hypertension, hyperlipidemia, allergic rhinitis GERD, seizures, sleep apnea not on CPAP.  He is referred today for evaluation of hemoptysis.  He reports that he has been seeing blood in some of his sputum, streaks in his phlegm, for about 6 months. Mostly in the am, but can happen during the day also. He has seen some epistaxis as well, some crusting blood. Sometimes hears wheeze at night. Can lose his voice in the evening. Still has GERD sx, on protonix bid.   He has had some exercise chest discomfort - saw Dr Samuel Jacobs and had a reassuring cardiac stress test  A CT chest was done on 11/26 last 19 that I have reviewed.  This shows some faintly calcified granulomas scattered throughout both lung fields.  He also has some calcified left hilar lymphadenopathy.  No suspicious lung nodules or infiltrates seen.    Review of Systems  Constitutional: Negative for fever and unexpected weight change.  HENT: Negative for congestion, dental problem, ear pain, nosebleeds, postnasal drip, rhinorrhea, sinus pressure, sneezing, sore throat and trouble swallowing.   Eyes: Negative for redness and itching.  Respiratory: Positive for cough, chest tightness, shortness of breath and wheezing.   Cardiovascular: Negative for palpitations and leg swelling.  Gastrointestinal: Negative for nausea and vomiting.  Genitourinary: Negative for dysuria.  Musculoskeletal: Negative for joint swelling.  Skin: Negative for rash.  Neurological: Negative for headaches.  Hematological: Does not bruise/bleed easily.  Psychiatric/Behavioral: Negative for dysphoric mood. The patient is not nervous/anxious.     Past Medical History:  Diagnosis Date  . Allergic rhinitis   . Allergy   . Anxiety   . Arthritis   . Carpal tunnel syndrome    09-12-15 no longer a problem.  . Colon polyps   .  Complication of anesthesia    one time awaken- not with last procedure done.  . Depression   . ED (erectile dysfunction)   . GERD (gastroesophageal reflux disease)   . HLD (hyperlipidemia)   . Insomnia   . Seizure ([place])    08-13-15 per pt his last seizure was "about 1 year ago".  maw  . Sleep apnea    pt does not wear c-pap     Family History  Problem Relation Age of Onset  . Breast cancer Mother        mets to liver  . Stomach cancer Father   . Epilepsy Sister   . Cancer Maternal Grandmother        type unknown  . Cancer Maternal Grandfather        type unknown  . Cancer Paternal Grandfather        type unknown  . Cancer Paternal Grandmother        type unknown  . Colon cancer Neg Hx   . Esophageal cancer Neg Hx   . Rectal cancer Neg Hx   . Prostate cancer Neg Hx      Social History   Socioeconomic History  . Marital status: Married    Spouse name: Samuel Jacobs  . Number of children: 3  . Years of education: college  . Highest education level: Not on file  Occupational History  . Occupation: semi-retired  Social Needs  . Financial resource strain: Not on file  . Food insecurity:    Worry: Not on file  Inability: Not on file  . Transportation needs:    Medical: Not on file    Non-medical: Not on file  Tobacco Use  . Smoking status: Never Smoker  . Smokeless tobacco: Never Used  Substance and Sexual Activity  . Alcohol use: No    Alcohol/week: 0.0 standard drinks    Comment: past -none in 7-8 yrs social only  . Drug use: No  . Sexual activity: Not on file  Lifestyle  . Physical activity:    Days per week: Not on file    Minutes per session: Not on file  . Stress: Not on file  Relationships  . Social connections:    Talks on phone: Not on file    Gets together: Not on file    Attends religious service: Not on file    Active member of club or organization: Not on file    Attends meetings of clubs or organizations: Not on file    Relationship status:  Not on file  . Intimate partner violence:    Fear of current or ex partner: Not on file    Emotionally abused: Not on file    Physically abused: Not on file    Forced sexual activity: Not on file  Other Topics Concern  . Not on file  Social History Narrative   Patient lives at home with wife Samuel Jacobs.    Patient has retired and works part time at [employer]   Patient has 3 children.    Patient has a college education.   Patient is right handed.  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  No military  No known TB or silica exposure   Allergies  Allergen Reactions  . Sulfur Nausea Only     Outpatient Medications Prior to Visit  Medication Sig Dispense Refill  . atorvastatin (LIPITOR) 10 MG tablet Take 10 mg by mouth daily.  2  . b complex vitamins tablet Take 1 tablet by mouth daily.    . Cholecalciferol (VITAMIN D PO) Take 1 tablet by mouth daily.    . fluticasone (FLONASE) 50 MCG/ACT nasal spray Place 1 spray into both nostrils daily as needed for allergies. Reported on 08/13/2015    . HYDROcodone-acetaminophen (NORCO/VICODIN) 5-325 MG tablet Take 1 tablet by mouth every 8 (eight) hours as needed for moderate pain.   0  . irbesartan (AVAPRO) 300 MG tablet TAKE 1 TABLET BY MOUTH DAILY *INCREASED DOSE*  6  . Lacosamide (VIMPAT) 150 MG TABS Take 1 tablet (150 mg total) by mouth 2 (two) times daily. 180 tablet 4  . meloxicam (MOBIC) 15 MG tablet TAKE 1 TABLET BY MOUTH EVERY DAY 30 tablet 0  . pantoprazole (PROTONIX) 40 MG tablet Take 40 mg by mouth 2 (two) times daily.  4  . traZODone (DESYREL) 100 MG tablet TAKE 1 TABLET BY MOUTH EVERYDAY AT BEDTIME 90 tablet 3  . omeprazole (PRILOSEC) 40 MG capsule Take 40 mg by mouth 2 (two) times daily.  3   Facility-Administered Medications Prior to Visit  Medication Dose Route Frequency Provider Last Rate Last Dose  . 0.9 %  sodium chloride infusion  500 mL Intravenous Once Irene Shipper, MD             Objective:   Physical Exam Vitals:   07/20/18 1451  BP: 118/82  Pulse: 72  SpO2: 98%  Weight: 259 lb (117.5 kg)  Height: 5\' 11"  (1.803 m)  Gen: Pleasant, well-nourished, in no distress,  normal affect  ENT: No lesions,  mouth clear,  oropharynx clear, some irritated nasal mucosa right greater than left with small amount of blood  Neck: No JVD, no stridor  Lungs: No use of accessory muscles, no crackles or wheezing on normal respiration, no wheeze on forced expiration  Cardiovascular: RRR, heart sounds normal, no murmur or gallops, trace peripheral edema  Musculoskeletal: No deformities, no cyanosis or clubbing  Neuro: alert, awake, non focal  Skin: Warm, no lesions or rash      Assessment & Plan:  Hemoptysis Etiology unclear.  He does have a reassuring CT chest with some calcified granulomas and some calcified left hilar lymphadenopathy, question exposure to histo in the past, consider fibrosing mediastinitis.  I think needs bronchoscopy for airway inspection.  I will check a screening ACE level, QuantiFERON gold although he denies any TB exposure.  Need to try to aggressively treat nasal irritation, GERD.  Dyspnea He has had a reassuring cardiac evaluation.  I will arrange for pulmonary function testing, follow with him in 1 month  Baltazar Apo, MD, PhD 07/20/2018, 3:36 PM [facility] [phone] or if no answer [phone]

## 2018-07-20 NOTE — Assessment & Plan Note (Signed)
He has had a reassuring cardiac evaluation.  I will arrange for pulmonary function testing, follow with him in 1 month

## 2018-07-20 NOTE — Patient Instructions (Addendum)
Please continue your pantoprazole twice a day as you have been taking it. We will arrange for pulmonary function testing to evaluate your chest discomfort and shortness of breath. We will arrange for a bronchoscopy to evaluate bleeding Blood work today Follow with Dr Lamonte Sakai in 1 month

## 2018-07-20 NOTE — Assessment & Plan Note (Signed)
Etiology unclear.  He does have a reassuring CT chest with some calcified granulomas and some calcified left hilar lymphadenopathy, question exposure to histo in the past, consider fibrosing mediastinitis.  I think needs bronchoscopy for airway inspection.  I will check a screening ACE level, QuantiFERON gold although he denies any TB exposure.  Need to try to aggressively treat nasal irritation, GERD.

## 2018-07-22 LAB — QUANTIFERON-TB GOLD PLUS
NIL: 0.03 [IU]/mL
QUANTIFERON-TB GOLD PLUS: POSITIVE — AB
TB1-NIL: 0.38 [IU]/mL
TB2-NIL: 0.29 [IU]/mL

## 2018-07-22 LAB — ANGIOTENSIN CONVERTING ENZYME: Angiotensin-Converting Enzyme: 28 U/L (ref 9–67)

## 2018-07-27 ENCOUNTER — Encounter (HOSPITAL_COMMUNITY)
Admission: RE | Disposition: A | Discharge status: Home / Self Care | Payer: Self-pay | Source: Home / Self Care | Attending: Emergency Medicine

## 2018-07-27 ENCOUNTER — Ambulatory Visit (HOSPITAL_COMMUNITY)
Admission: RE | Admit: 2018-07-27 | Discharge: 2018-07-27 | Disposition: A | Discharge status: Home / Self Care | Payer: Managed Care, Other (non HMO) | Source: Ambulatory Visit | Attending: Emergency Medicine | Admitting: Emergency Medicine

## 2018-07-27 ENCOUNTER — Encounter (HOSPITAL_COMMUNITY): Payer: Self-pay | Admitting: Respiratory Therapy

## 2018-07-27 ENCOUNTER — Ambulatory Visit (HOSPITAL_COMMUNITY)
Admission: RE | Admit: 2018-07-27 | Discharge: 2018-07-27 | Disposition: A | Discharge status: Home / Self Care | Payer: Managed Care, Other (non HMO) | Attending: Emergency Medicine | Admitting: Emergency Medicine

## 2018-07-27 DIAGNOSIS — Z882 Allergy status to sulfonamides status: Secondary | ICD-10-CM | POA: Insufficient documentation

## 2018-07-27 DIAGNOSIS — R569 Unspecified convulsions: Secondary | ICD-10-CM | POA: Diagnosis not present

## 2018-07-27 DIAGNOSIS — I10 Essential (primary) hypertension: Secondary | ICD-10-CM | POA: Diagnosis not present

## 2018-07-27 DIAGNOSIS — R042 Hemoptysis: Secondary | ICD-10-CM | POA: Diagnosis not present

## 2018-07-27 DIAGNOSIS — F419 Anxiety disorder, unspecified: Secondary | ICD-10-CM | POA: Insufficient documentation

## 2018-07-27 DIAGNOSIS — Z791 Long term (current) use of non-steroidal anti-inflammatories (NSAID): Secondary | ICD-10-CM | POA: Diagnosis not present

## 2018-07-27 DIAGNOSIS — K219 Gastro-esophageal reflux disease without esophagitis: Secondary | ICD-10-CM | POA: Diagnosis not present

## 2018-07-27 DIAGNOSIS — R918 Other nonspecific abnormal finding of lung field: Secondary | ICD-10-CM | POA: Diagnosis not present

## 2018-07-27 DIAGNOSIS — F329 Major depressive disorder, single episode, unspecified: Secondary | ICD-10-CM | POA: Insufficient documentation

## 2018-07-27 DIAGNOSIS — G4730 Sleep apnea, unspecified: Secondary | ICD-10-CM | POA: Diagnosis not present

## 2018-07-27 DIAGNOSIS — Z7951 Long term (current) use of inhaled steroids: Secondary | ICD-10-CM | POA: Diagnosis not present

## 2018-07-27 DIAGNOSIS — Z79899 Other long term (current) drug therapy: Secondary | ICD-10-CM | POA: Diagnosis not present

## 2018-07-27 DIAGNOSIS — E785 Hyperlipidemia, unspecified: Secondary | ICD-10-CM | POA: Diagnosis not present

## 2018-07-27 HISTORY — PX: VIDEO BRONCHOSCOPY: SHX5072

## 2018-07-27 LAB — BODY FLUID CELL COUNT WITH DIFFERENTIAL
EOS FL: 2 %
Lymphs, Fluid: 9 %
Monocyte-Macrophage-Serous Fluid: 64 % (ref 50–90)
Neutrophil Count, Fluid: 25 % (ref 0–25)
Total Nucleated Cell Count, Fluid: 185 cu mm (ref 0–1000)

## 2018-07-27 SURGERY — VIDEO BRONCHOSCOPY WITHOUT FLUORO
Anesthesia: Moderate Sedation | Laterality: Bilateral

## 2018-07-27 MED ORDER — PHENYLEPHRINE HCL 0.25 % NA SOLN
NASAL | Status: DC | PRN
  Administered 2018-07-27: 2 via NASAL

## 2018-07-27 MED ORDER — FENTANYL CITRATE (PF) 100 MCG/2ML IJ SOLN
INTRAMUSCULAR | Status: AC
  Filled 2018-07-27: qty 4

## 2018-07-27 MED ORDER — MIDAZOLAM HCL (PF) 5 MG/ML IJ SOLN
INTRAMUSCULAR | Status: AC
  Filled 2018-07-27: qty 2

## 2018-07-27 MED ORDER — LIDOCAINE HCL 2 % EX GEL
1.0000 "application " | Freq: Once | CUTANEOUS | Status: DC
  Filled 2018-07-27: qty 1

## 2018-07-27 MED ORDER — MIDAZOLAM HCL (PF) 10 MG/2ML IJ SOLN
INTRAMUSCULAR | Status: DC | PRN
  Administered 2018-07-27: 2 mg via INTRAVENOUS
  Administered 2018-07-27: 1 mg via INTRAVENOUS
  Administered 2018-07-27: 2 mg via INTRAVENOUS
  Administered 2018-07-27: 1 mg via INTRAVENOUS

## 2018-07-27 MED ORDER — LIDOCAINE HCL URETHRAL/MUCOSAL 2 % EX GEL
CUTANEOUS | Status: DC | PRN
  Administered 2018-07-27: 1

## 2018-07-27 MED ORDER — SODIUM CHLORIDE 0.9 % IV SOLN
INTRAVENOUS | Status: DC
  Administered 2018-07-27: 08:00:00 via INTRAVENOUS

## 2018-07-27 MED ORDER — FENTANYL CITRATE (PF) 100 MCG/2ML IJ SOLN
INTRAMUSCULAR | Status: DC | PRN
  Administered 2018-07-27: 50 ug via INTRAVENOUS
  Administered 2018-07-27: 25 ug via INTRAVENOUS
  Administered 2018-07-27: 50 ug via INTRAVENOUS
  Administered 2018-07-27: 25 ug via INTRAVENOUS

## 2018-07-27 MED ORDER — PHENYLEPHRINE HCL 0.25 % NA SOLN
1.0000 | Freq: Four times a day (QID) | NASAL | Status: DC | PRN
  Filled 2018-07-27: qty 15

## 2018-07-27 MED ORDER — LIDOCAINE HCL 1 % IJ SOLN
INTRAMUSCULAR | Status: DC | PRN
  Administered 2018-07-27: 6 mL via RESPIRATORY_TRACT

## 2018-07-27 NOTE — Discharge Instructions (Signed)
Flexible Bronchoscopy, Care After This sheet gives you information about how to care for yourself after your test. Your doctor may also give you more specific instructions. If you have problems or questions, contact your doctor. Follow these instructions at home: Eating and drinking  Do not eat or drink anything (not even water) for 2 hours after your test, or until your numbing medicine (local anesthetic) wears off.  When your numbness is gone and your cough and gag reflexes have come back, you may: ? Eat only soft foods. ? Slowly drink liquids.  The day after the test, go back to your normal diet. Driving  Do not drive for 24 hours if you were given a medicine to help you relax (sedative).  Do not drive or use heavy machinery while taking prescription pain medicine. General instructions   Take over-the-counter and prescription medicines only as told by your doctor.  Return to your normal activities as told. Ask what activities are safe for you.  Do not use any products that have nicotine or tobacco in them. This includes cigarettes and e-cigarettes. If you need help quitting, ask your doctor.  Keep all follow-up visits as told by your doctor. This is important. It is very important if you had a tissue sample (biopsy) taken. Get help right away if:  You have shortness of breath that gets worse.  You get light-headed.  You feel like you are going to pass out (faint).  NoYou have chest pain.  You cough up: ? More than a little blood. ? More blood than before. Summary  Do not eat or drink anything (not even water) for 2 hours after your test, or until your numbing medicine wears off.  Do not use cigarettes. Do not use e-cigarettes.  Get help right away if you have chest pain. This information is not intended to replace advice given to you by your health care provider. Make sure you discuss any questions you have with your health care provider. Document Released:  04/25/2009 Document Revised: 07/16/2016 Document Reviewed: 07/16/2016 Elsevier Interactive Patient Education  2019 [place].  Nothing to eat or drink until  10:30 am    07/27/2018  Any questions or concerns please call the  office   [phone]

## 2018-07-27 NOTE — Progress Notes (Signed)
Video Bronchoscopy done Intervention Bronchial washing Intervention Bronchial biopsy Procedure tolerated well 

## 2018-07-27 NOTE — Op Note (Signed)
[facility] Patient Name: Samuel Jacobs Pocedure Date: 07/27/2018 MRN: [id number] Attending MD: Collene Gobble , MD Date of Birth: [birth year] CSN: Finalized Age: 68 Admit Type: Ambulatory Gender: Male Procedure:            Bronchoscopy Indications:          Hemoptysis with abnormal CXR, Chronic cough Providers:            Collene Gobble, MD, Andre Lefort RRT,RCP, Ashley Mariner RRT,RCP Referring MD:          Medicines:            Midazolam 6 mg IV, Fentanyl 150 mcg IV, Lidocaine 1%                        applied to cords 8 mL, Lidocaine 1% applied to the                        tracheobronchial tree 12 mL Complications:        No immediate complications Estimated Blood Loss: Estimated blood loss was minimal. Procedure:            Pre-Anesthesia Assessment:                       - A History and Physical has been performed. Patient                        meds and allergies have been reviewed. The risks and                        benefits of the procedure and the sedation options and                        risks were discussed with the patient. All questions                        were answered and informed consent was obtained.                        Patient identification and proposed procedure were                        verified prior to the procedure by the physician in the                        procedure room. Mental Status Examination: normal.                        Airway Examination: normal oropharyngeal airway.                        Respiratory Examination: clear to auscultation. CV                        Examination: regular rate and rhythm. ASA Grade                        Assessment: I - A normal healthy patient. After  reviewing the risks and benefits, the patient was                        deemed in satisfactory condition to undergo the                        procedure. The anesthesia plan was  to use moderate                        sedation / analgesia (conscious sedation). Immediately                        prior to administration of medications, the patient was                        re-assessed for adequacy to receive sedatives. The                        heart rate, respiratory rate, oxygen saturations, blood                        pressure, adequacy of pulmonary ventilation, and                        response to care were monitored throughout the                        procedure. The physical status of the patient was                        re-assessed after the procedure.                       After obtaining informed consent, the bronchoscope was                        passed under direct vision. Throughout the procedure,                        the patient's blood pressure, pulse, and oxygen                        saturations were monitored continuously. the BF-H190                        ([id number]) Olympus Diagnostic Bronchoscope was                        introduced through the right nostril and advanced to                        the tracheobronchial tree. The procedure was                        accomplished without difficulty. The patient tolerated                        the procedure well. The total duration of the procedure                        was 24 minutes.  Scope In: 8:25:26 AM Scope Out: 8:38:10 AM Findings:      The nasopharynx/oropharynx appears normal. The larynx appears normal       with exception of some cobblestone change consistent with chronic       irritation. The vocal cords appear normal. The subglottic space is       normal. The trachea is of normal caliber. The carina is sharp. The       tracheobronchial tree of the left lung was examined to at least the       first subsegmental level. Bronchial mucosa and anatomy in the left lung       are normal; there are no endobronchial lesions, and no secretions.      Right Lung Abnormalities: Fresh blood  was found at the oriface of the       the bronchus intermedius just distal to the RUL takeoff. No other       bleeding site was not identified - unclear if this mucosa was the       source. Endobronchial biopsies were performed in the bronchus       intermedius using forceps at the area of bleeding and sent for       histopathology examination. Three samples were obtained. BAL was       performed in the RUL anterior segment (B3) of the lung and sent for cell       count, bacterial culture, viral smears & culture, and fungal & AFB       analysis and cytology. 60 mL of fluid were instilled. 20 mL were       returned. The return was clear. There were no mucoid plugs in the return       fluid. Impression:           - Hemoptysis with abnormal CXR                       - Chronic cough                       - The airway examination of the left lung was normal.                       - Blood was present in the bronchus intermedius.                       - An endobronchial biopsy was performed.                       - Bronchoalveolar lavage was performed. Moderate Sedation:      Moderate (conscious) sedation was personally administered by the       endoscopist. The following parameters were monitored: oxygen saturation,       heart rate, blood pressure, respiratory rate, EKG, adequacy of pulmonary       ventilation, and response to care. Total physician intraservice time was       24 minutes. Recommendation:       - Await BAL and biopsy results. Procedure Code(s):    --- Professional ---                       [phone], Bronchoscopy, rigid or flexible, including  fluoroscopic guidance, when performed; with bronchial                        or endobronchial biopsy(s), single or multiple sites                       31624, Bronchoscopy, rigid or flexible, including                        fluoroscopic guidance, when performed; with bronchial                        alveolar lavage                        [id number], Moderate sedation services provided by the same                        physician or other qualified health care professional                        performing the diagnostic or therapeutic service that                        the sedation supports, requiring the presence of an                        independent trained observer to assist in the                        monitoring of the patient's level of consciousness and                        physiological status; initial 15 minutes of                        intraservice time, patient age 52 years or older                       [phone], Moderate sedation; each additional 15 minutes                        intraservice time Diagnosis Code(s):    --- Professional ---                       R04.2, Hemoptysis                       R91.8, Other nonspecific abnormal finding of lung field                       R05, Cough CPT copyright 2018 American Medical Association. All rights reserved. The codes documented in this report are preliminary and upon coder review may  be revised to meet current compliance requirements. Collene Gobble, MD Collene Gobble, MD 07/27/2018 9:02:55 AM Number of Addenda: 0

## 2018-07-27 NOTE — Interval H&P Note (Signed)
PCCM Interval Note  Patient is here today for further evaluation of his hemoptysis.  He has continued to have cough sometimes paroxysmal and usually productive of some blood-streaked sputum.  He denies any epistaxis.  His QuantiFERON gold from 07/20/2018 came back positive, consistent with TB exposure.  He is never been treated for latent TB.  He has calcified granulomatous disease and some mediastinal calcified lymphadenopathy on CT scan from 06/06/2018 but nothing that would be suggestive of active tuberculosis.  Next steps will be bronchoscopy, airway inspection, BAL for culture data.  Procedure discussed with the patient, risks and benefits reviewed.  Patient understands and agrees to proceed.  Baltazar Apo, MD, PhD 07/27/2018, 8:18 AM [facility] [phone] or if no answer [phone]

## 2018-07-28 LAB — ACID FAST SMEAR (AFB, MYCOBACTERIA): Acid Fast Smear: NEGATIVE

## 2018-07-29 LAB — CULTURE, BAL-QUANTITATIVE W GRAM STAIN: Culture: 30000 — AB

## 2018-07-29 LAB — CULTURE, BAL-QUANTITATIVE

## 2018-07-31 ENCOUNTER — Encounter (HOSPITAL_COMMUNITY): Payer: Self-pay | Admitting: Emergency Medicine

## 2018-08-02 ENCOUNTER — Telehealth: Payer: Self-pay | Admitting: Emergency Medicine

## 2018-08-02 NOTE — Telephone Encounter (Signed)
LMTCB.   RB please advise patient is calling requesting results of his Bronch.

## 2018-08-02 NOTE — Telephone Encounter (Signed)
Please let the patient know that his pathology and cytology results were all normal, no evidence for cancer.  This is great news.  Initial slides did not show any evidence of either bacterial or atypical infection.  Final cultures are still pending.

## 2018-08-03 NOTE — Telephone Encounter (Signed)
Called and spoke with Patient.  RB results given.  Understanding stated.  Nothing further at time.

## 2018-08-16 ENCOUNTER — Encounter (HOSPITAL_COMMUNITY): Payer: Self-pay | Admitting: Emergency Medicine

## 2018-08-16 ENCOUNTER — Emergency Department (HOSPITAL_COMMUNITY)
Admission: EM | Admit: 2018-08-16 | Discharge: 2018-08-16 | Disposition: A | Discharge status: Home / Self Care | Payer: Managed Care, Other (non HMO) | Attending: Emergency Medicine | Admitting: Emergency Medicine

## 2018-08-16 ENCOUNTER — Emergency Department (HOSPITAL_COMMUNITY): Payer: Managed Care, Other (non HMO)

## 2018-08-16 DIAGNOSIS — R079 Chest pain, unspecified: Secondary | ICD-10-CM | POA: Insufficient documentation

## 2018-08-16 DIAGNOSIS — I712 Thoracic aortic aneurysm, without rupture, unspecified: Secondary | ICD-10-CM

## 2018-08-16 DIAGNOSIS — H5789 Other specified disorders of eye and adnexa: Secondary | ICD-10-CM

## 2018-08-16 DIAGNOSIS — Z79899 Other long term (current) drug therapy: Secondary | ICD-10-CM | POA: Diagnosis not present

## 2018-08-16 DIAGNOSIS — R0609 Other forms of dyspnea: Secondary | ICD-10-CM | POA: Insufficient documentation

## 2018-08-16 DIAGNOSIS — E785 Hyperlipidemia, unspecified: Secondary | ICD-10-CM | POA: Diagnosis not present

## 2018-08-16 LAB — I-STAT TROPONIN, ED
Troponin i, poc: 0 ng/mL (ref 0.00–0.08)
Troponin i, poc: 0.01 ng/mL (ref 0.00–0.08)

## 2018-08-16 LAB — BASIC METABOLIC PANEL
Anion gap: 7 (ref 5–15)
BUN: 12 mg/dL (ref 8–23)
CO2: 27 mmol/L (ref 22–32)
Calcium: 9.6 mg/dL (ref 8.9–10.3)
Chloride: 105 mmol/L (ref 98–111)
Creatinine, Ser: 1.1 mg/dL (ref 0.61–1.24)
GFR calc Af Amer: >60 mL/min (ref >60)
GFR calc non Af Amer: >60 mL/min (ref >60)
Glucose, Bld: 146 mg/dL — ABNORMAL HIGH (ref 70–99)
Potassium: 4.1 mmol/L (ref 3.5–5.1)
SODIUM: 139 mmol/L (ref 135–145)

## 2018-08-16 LAB — CBC
HCT: 47.8 % (ref 39.0–52.0)
Hemoglobin: 15.8 g/dL (ref 13.0–17.0)
MCH: 29.2 pg (ref 26.0–34.0)
MCHC: 33.1 g/dL (ref 30.0–36.0)
MCV: 88.2 fL (ref 80.0–100.0)
Platelets: 290 10*3/uL (ref 150–400)
RBC: 5.42 MIL/uL (ref 4.22–5.81)
RDW: 13 % (ref 11.5–15.5)
WBC: 8.3 10*3/uL (ref 4.0–10.5)
nRBC: 0 % (ref 0.0–0.2)

## 2018-08-16 MED ORDER — IOPAMIDOL (ISOVUE-370) INJECTION 76%
100.0000 mL | Freq: Once | INTRAVENOUS | Status: AC | PRN
  Administered 2018-08-16: 100 mL via INTRAVENOUS

## 2018-08-16 MED ORDER — TETRACAINE HCL 0.5 % OP SOLN
2.0000 [drp] | Freq: Once | OPHTHALMIC | Status: AC
  Administered 2018-08-16: 2 [drp] via OPHTHALMIC
  Filled 2018-08-16: qty 4

## 2018-08-16 MED ORDER — SODIUM CHLORIDE 0.9% FLUSH
3.0000 mL | Freq: Once | INTRAVENOUS | Status: AC
  Administered 2018-08-16: 3 mL via INTRAVENOUS

## 2018-08-16 MED ORDER — IOPAMIDOL (ISOVUE-370) INJECTION 76%
INTRAVENOUS | Status: AC
  Filled 2018-08-16: qty 100

## 2018-08-16 MED ORDER — FLUORESCEIN SODIUM 1 MG OP STRP
1.0000 | ORAL_STRIP | Freq: Once | OPHTHALMIC | Status: AC
  Administered 2018-08-16: 1 via OPHTHALMIC
  Filled 2018-08-16: qty 1

## 2018-08-16 NOTE — Discharge Instructions (Addendum)
You were seen in the urgency department for increased shortness of breath.  This is been going on intermittently for weeks along with some chest pain.  You had blood work EKG and a chest x-ray here.  It will be important for you to follow-up with your pulmonologist as scheduled on Friday.  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  Please return if any worsening shortness of breath chest pain or other concerns.  You also were concerned that a contact lens was still in your left eye.  We were unable to find it and it may just be that you have some irritation leftover.

## 2018-08-16 NOTE — ED Notes (Signed)
Pt ambulated approximately 122ft. PulseOx maintained at 94% while ambulating, but did drop as low as 92-93% (unsure due to artifact). Pt's heart rate maintained at 64bpm. Pt did not complain of shortness of breath, dizziness, weakness, or otherwise. Pt ambulated completely independently.

## 2018-08-16 NOTE — ED Provider Notes (Signed)
[facility] EMERGENCY DEPARTMENT Provider Note   CSN: [id number] Arrival date & time: 08/16/18  0449     History   Chief Complaint Chief Complaint  Patient presents with  . Chest Pain  . Cough  . Eye Problem    HPI Bowyn Mercier is a 68 y.o. male.  He said he had some chest pain and shortness of breath that started this morning at rest.  This was about 430 this morning.  He said he was up because he felt some pain in his left eye that he relates to a contact lens that he could not remove.  He still feels like the contact is in there.  Then he started getting some rapid breathing and some chest discomfort.  This is since resolved.  He said for the last 3 weeks he has been having some shortness of breath sometimes at rest and sometimes with exertion.  He also had experienced some chest discomfort that radiated into his shoulders and jaw last week.  For about 6 months he has had cough with some hemoptysis and recently had a bronc.  He has an appointment with the pulmonologist on Friday.  No fevers or chills.  The history is provided by the patient.  Eye Problem  Location:  Left eye Quality:  Foreign body sensation Severity:  Moderate Onset quality:  Sudden Timing:  Constant Progression:  Unchanged Chronicity:  New Context: contact lenses   Relieved by:  Nothing Worsened by:  Nothing Ineffective treatments:  None tried Associated symptoms: no headaches and no vomiting   Shortness of Breath  Severity:  Moderate Onset quality:  Gradual Duration:  3 weeks Timing:  Intermittent Progression:  Unchanged Chronicity:  New Context: activity   Relieved by:  Rest Worsened by:  Activity Ineffective treatments:  None tried Associated symptoms: chest pain, cough, hemoptysis and sputum production   Associated symptoms: no abdominal pain, no fever, no headaches, no neck pain, no rash, no sore throat, no syncope and no vomiting   Chest pain:    Quality: aching     Severity:   Moderate   Onset quality:  Gradual   Timing:  Intermittent   Progression:  Resolved   Chronicity:  New   Past Medical History:  Diagnosis Date  . Allergic rhinitis   . Allergy   . Anxiety   . Arthritis   . Carpal tunnel syndrome    09-12-15 no longer a problem.  . Colon polyps   . Complication of anesthesia    one time awaken- not with last procedure done.  . Depression   . ED (erectile dysfunction)   . GERD (gastroesophageal reflux disease)   . HLD (hyperlipidemia)   . Insomnia   . Seizure ([place])    08-13-15 per pt his last seizure was "about 1 year ago".  maw  . Sleep apnea    pt does not wear c-pap    Patient Active Problem List   Diagnosis Date Noted  . Hemoptysis 07/20/2018  . Dyspnea 07/20/2018  . Insomnia 08/21/2015  . Hallux rigidus of both feet 05/29/2015  . 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 Encounter for long-term (current) use of other medications 04/03/2013    Past Surgical History:  Procedure Laterality Date  . CHOLECYSTECTOMY  11/07/2015  . COLONOSCOPY    . EUS N/A 09/18/2015   Procedure: UPPER ENDOSCOPIC ULTRASOUND (EUS) LINEAR;  Surgeon: Milus Banister, MD;  Location: [facility] ENDOSCOPY;  Service: Endoscopy;  Laterality: N/A;  . TONSILLECTOMY    . 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  Marland Kitchen UPPER GASTROINTESTINAL ENDOSCOPY    . VIDEO BRONCHOSCOPY Bilateral 07/27/2018   Procedure: VIDEO BRONCHOSCOPY WITHOUT FLUORO;  Surgeon: Collene Gobble, MD;  Location: [facility] ENDOSCOPY;  Service: Cardiopulmonary;  Laterality: Bilateral;  . WISDOM TOOTH EXTRACTION          Home Medications    Prior to Admission medications   Medication Sig Start Date End Date Taking? Authorizing Provider  fluticasone (FLONASE) 50 MCG/ACT nasal spray Place 1 spray into both nostrils daily as needed for allergies. Reported on 08/13/2015 11/26/14   [provider]  HYDROcodone-acetaminophen (NORCO/VICODIN)  5-325 MG tablet Take 1 tablet by mouth every 8 (eight) hours as needed for moderate pain.  07/18/15   [provider]  irbesartan (AVAPRO) 300 MG tablet TAKE 1 TABLET BY MOUTH DAILY *INCREASED DOSE* 11/21/17   [provider]  Lacosamide (VIMPAT) 150 MG TABS Take 1 tablet (150 mg total) by mouth 2 (two) times daily. 04/26/18   Marcial Pacas, MD  meloxicam (MOBIC) 15 MG tablet TAKE 1 TABLET BY MOUTH EVERY DAY 12/09/16   Trula Slade, DPM  metoprolol succinate (TOPROL-XL) 25 MG 24 hr tablet Take 25 mg by mouth daily.    [provider]  pantoprazole (PROTONIX) 40 MG tablet Take 40 mg by mouth 2 (two) times daily. 02/21/18   [provider]  ranitidine (ZANTAC) 300 MG tablet Take 300 mg by mouth daily as needed for heartburn.    [provider]  traZODone (DESYREL) 100 MG tablet TAKE 1 TABLET BY MOUTH EVERYDAY AT BEDTIME Patient taking differently: Take 100 mg by mouth at bedtime.  06/29/18   Marcial Pacas, MD    Family History Family History  Problem Relation Age of Onset  . Breast cancer Mother        mets to liver  . Stomach cancer Father   . Epilepsy Sister   . Cancer Maternal Grandmother        type unknown  . Cancer Maternal Grandfather        type unknown  . Cancer Paternal Grandfather        type unknown  . Cancer Paternal Grandmother        type unknown  . Colon cancer Neg Hx   . Esophageal cancer Neg Hx   . Rectal cancer Neg Hx   . Prostate cancer Neg Hx     Social History Social History   Tobacco Use  . Smoking status: Never Smoker  . Smokeless tobacco: Never Used  Substance Use Topics  . Alcohol use: No    Alcohol/week: 0.0 standard drinks    Comment: past -none in 7-8 yrs social only  . Drug use: No     Allergies   Sulfur   Review of Systems Review of Systems  Constitutional: Negative for fever.  HENT: Negative for sore throat.   Eyes: Positive for pain.  Respiratory: Positive for cough, hemoptysis, sputum  production and shortness of breath.   Cardiovascular: Positive for chest pain. Negative for syncope.  Gastrointestinal: Negative for abdominal pain and vomiting.  Genitourinary: Negative for dysuria.  Musculoskeletal: Negative for neck pain.  Skin: Negative for rash.  Neurological: Negative for headaches.     Physical Exam Updated Vital Signs BP 134/70   Pulse (!) 52   Temp 97.9 F (36.6 C) (Oral)   Resp 17   SpO2 94%  Physical Exam Vitals signs and nursing note reviewed.  Constitutional:      Appearance: He is well-developed.  HENT:     Head: Normocephalic and atraumatic.  Eyes:     Conjunctiva/sclera: Conjunctivae normal.  Neck:     Musculoskeletal: Neck supple.  Cardiovascular:     Rate and Rhythm: Normal rate and regular rhythm.     Heart sounds: No murmur.  Pulmonary:     Effort: Pulmonary effort is normal. No respiratory distress.     Breath sounds: Normal breath sounds.  Abdominal:     Palpations: Abdomen is soft.     Tenderness: There is no abdominal tenderness.  Musculoskeletal:     Right lower leg: He exhibits no tenderness. No edema.     Left lower leg: He exhibits no tenderness. No edema.  Skin:    General: Skin is warm and dry.     Capillary Refill: Capillary refill takes less than 2 seconds.  Neurological:     General: No focal deficit present.     Mental Status: He is alert and oriented to person, place, and time.      ED Treatments / Results  Labs (all labs ordered are listed, but only abnormal results are displayed) Labs Reviewed  BASIC METABOLIC PANEL - Abnormal; Notable for the following components:      Result Value   Glucose, Bld 146 (*)    All other components within normal limits  CBC  I-STAT TROPONIN, ED  I-STAT TROPONIN, ED    EKG EKG Interpretation  Date/Time:  Wednesday August 16 2018 04:54:43 EST Ventricular Rate:  59 PR Interval:  166 QRS Duration: 136 QT Interval:  448 QTC Calculation: 443 R Axis:   -46 Text  Interpretation:  Sinus bradycardia Right bundle branch block Left anterior fascicular block Abnormal ECG new rbbb since prior 4/13 Reconfirmed by Aletta Edouard [phone]) on 08/16/2018 6:21:51 PM   Radiology Dg Chest 2 View  Result Date: 08/16/2018 CLINICAL DATA:  Chest pain and shortness of breath beginning 30 minutes ago EXAM: CHEST - 2 VIEW COMPARISON:  06/06/2018 chest CT FINDINGS: Normal heart size and mediastinal contours. Minimal scarring or atelectasis seen on the lateral view inferiorly. No acute infiltrate or edema. No effusion or pneumothorax. No acute osseous findings. IMPRESSION: No evidence of acute disease. Electronically Signed   By: Monte Fantasia M.D.   On: 08/16/2018 05:32   Ct Angio Chest Pe W/cm &/or Wo Cm  Result Date: 08/16/2018 CLINICAL DATA:  Shortness of breath and chest pain EXAM: CT ANGIOGRAPHY CHEST WITH CONTRAST TECHNIQUE: Multidetector CT imaging of the chest was performed using the standard protocol during bolus administration of intravenous contrast. Multiplanar CT image reconstructions and MIPs were obtained to evaluate the vascular anatomy. CONTRAST:  75 mL ISOVUE-370 IOPAMIDOL (ISOVUE-370) INJECTION 76% COMPARISON:  Chest CT June 06, 2018 and chest radiograph August 16, 2018 FINDINGS: Cardiovascular: There is no demonstrable pulmonary embolus. The ascending thoracic aortic diameter measures 4.2 x 4.2 cm. No dissection is evident. The contrast bolus in the aorta is not sufficient to assess confidently for dissection as a differential consideration. The visualized great vessels appear normal. Note that the right innominate and left common carotid arteries arise as a common trunk, an anatomic variant. There is aortic atherosclerosis. As well as foci of coronary artery calcification. There is no pericardial thickening. A small amount of pericardial fluid is felt to be in the upper physiologic range. Mediastinum/Nodes: Visualized thyroid appears normal. There is no  appreciable thoracic  adenopathy. There appears to be a degree of thickening of the wall of the esophagus in the mid the distal regions. Lungs/Pleura: There is atelectatic change in each lower lobe region, slightly more on the left than on the right. There is no frank edema or consolidation. No appreciable pleural effusion. Upper Abdomen: Gallbladder absent. Visualized upper abdominal structures otherwise appear unremarkable. Musculoskeletal: No blastic or lytic bone lesions. There are foci of degenerative change in the thoracic spine. No chest wall lesions evident. Review of the MIP images confirms the above findings. IMPRESSION: 1.  No demonstrable pulmonary embolus. 2. Ascending thoracic aortic diameter measures 4.2 x 4.2 cm. No dissection evident. Note, however, that the contrast bolus is not sufficient to exclude dissection confidently as a differential consideration from a radiologic standpoint. There is aortic atherosclerosis. There also foci of coronary artery calcification. Recommend annual imaging followup by CTA or MRA. This recommendation follows 2010 ACCF/AHA/AATS/ACR/ASA/SCA/SCAI/SIR/STS/SVM Guidelines for the Diagnosis and Management of Patients with Thoracic Aortic Disease. Circulation. 2010; 121: M086-P619. Aortic aneurysm NOS (ICD10-I71.9) 3.  Question a degree of esophagitis in the mid to distal esophagus. 4.  Lower lobe atelectatic change.  No edema or consolidation. 5.  No evident thoracic adenopathy. 6.  Absent gallbladder. Aortic Atherosclerosis (ICD10-I70.0). Electronically Signed   By: Lowella Grip III M.D.   On: 08/16/2018 11:02    Procedures Procedures (including critical care time)  Medications Ordered in ED Medications  sodium chloride flush (NS) 0.9 % injection 3 mL (has no administration in time range)  tetracaine (PONTOCAINE) 0.5 % ophthalmic solution 2 drop (has no administration in time range)  fluorescein ophthalmic strip 1 strip (has no administration in time range)       Initial Impression / Assessment and Plan / ED Course  I have reviewed the triage vital signs and the nursing notes.  Pertinent labs & imaging results that were available during my care of the patient were reviewed by me and considered in my medical decision making (see chart for details).  Clinical Course as of Aug 16 1818  Wed Aug 16, 2018  0927 Tetracaine was applied to right eye.  No foreign body was appreciated.  Lids were everted no foreign body noted.  Fluorescein was applied to the left eye and Woods lamp showed no obvious foreign body.   [MB]  1207 Patient had a trending pulse ox here her sats remained 94% with no tachycardia.  He has an appointment with pulmonology on Friday so I think it is reasonable for discharge and let them continue to work on his dyspnea.   [MB]  5093 Patient CT did mention a thoracic aneurysm and that they could not fully exclude that this could be dissection due to the timing of the contrast bolus.  Patient currently having no pain.  This will need to be followed up.   [MB]    Clinical Course User Index [MB] Hayden Rasmussen, MD      Final Clinical Impressions(s) / ED Diagnoses   Final diagnoses:  DOE (dyspnea on exertion)  Nonspecific chest pain  Irritation of left eye  Thoracic aortic aneurysm without rupture [facility])    ED Discharge Orders    None       Hayden Rasmussen, MD 08/16/18 [facility]

## 2018-08-16 NOTE — ED Triage Notes (Addendum)
Pt reports cp and sob that started suddenly about 30 minutes ago. Pt reports feeling very weak, states he has an appt with a pulmonologist coming up r/t hematemesis for the past 6 months. Also reports having a contact stuck in his L eye that he was unable to get out. Pt a/ox4, resp e/u, nad.

## 2018-08-16 NOTE — ED Notes (Signed)
Patient verbalizes understanding of discharge instructions. Opportunity for questioning and answers were provided. Armband removed by staff, pt discharged from ED.  

## 2018-08-16 NOTE — ED Notes (Signed)
Redraw istat troponin sent to mini lab at this time

## 2018-08-17 ENCOUNTER — Other Ambulatory Visit: Payer: Self-pay | Admitting: Emergency Medicine

## 2018-08-17 DIAGNOSIS — R042 Hemoptysis: Secondary | ICD-10-CM

## 2018-08-17 DIAGNOSIS — R0602 Shortness of breath: Secondary | ICD-10-CM

## 2018-08-18 ENCOUNTER — Encounter: Payer: Self-pay | Admitting: Emergency Medicine

## 2018-08-18 ENCOUNTER — Ambulatory Visit (INDEPENDENT_AMBULATORY_CARE_PROVIDER_SITE_OTHER): Payer: Managed Care, Other (non HMO) | Admitting: Emergency Medicine

## 2018-08-18 DIAGNOSIS — R042 Hemoptysis: Secondary | ICD-10-CM

## 2018-08-18 DIAGNOSIS — Z227 Latent tuberculosis: Secondary | ICD-10-CM | POA: Diagnosis not present

## 2018-08-18 DIAGNOSIS — R0602 Shortness of breath: Secondary | ICD-10-CM

## 2018-08-18 LAB — PULMONARY FUNCTION TEST
DL/VA % pred: 112 %
DL/VA: 4.59 ml/min/mmHg/L
DLCO cor % pred: 119 %
DLCO cor: 32.61 ml/min/mmHg
DLCO unc % pred: 123 %
DLCO unc: 33.67 ml/min/mmHg
FEF 25-75 Post: 1.75 L/sec
FEF 25-75 Pre: 2.75 L/sec
FEF2575-%Change-Post: -36 %
FEF2575-%PRED-POST: 64 %
FEF2575-%Pred-Pre: 101 %
FEV1-%Change-Post: -13 %
FEV1-%Pred-Post: 78 %
FEV1-%Pred-Pre: 90 %
FEV1-Post: 2.73 L
FEV1-Pre: 3.17 L
FEV1FVC-%CHANGE-POST: -13 %
FEV1FVC-%Pred-Pre: 94 %
FEV6-%Change-Post: -1 %
FEV6-%Pred-Post: 99 %
FEV6-%Pred-Pre: 100 %
FEV6-Post: 4.42 L
FEV6-Pre: 4.47 L
FEV6FVC-%Change-Post: 0 %
FEV6FVC-%Pred-Post: 105 %
FEV6FVC-%Pred-Pre: 105 %
FVC-%Change-Post: 0 %
FVC-%PRED-PRE: 95 %
FVC-%Pred-Post: 94 %
FVC-PRE: 4.49 L
FVC-Post: 4.46 L
POST FEV1/FVC RATIO: 61 %
Post FEV6/FVC ratio: 100 %
Pre FEV1/FVC ratio: 70 %
Pre FEV6/FVC Ratio: 100 %
RV % pred: 139 %
RV: 3.41 L
TLC % pred: 106 %
TLC: 7.67 L

## 2018-08-18 MED ORDER — ALBUTEROL SULFATE HFA 108 (90 BASE) MCG/ACT IN AERS: 2.0000 | INHALATION_SPRAY | RESPIRATORY_TRACT | 5 refills | Status: DC | PRN

## 2018-08-18 MED ORDER — RIFAMPIN 300 MG PO CAPS: 600.0000 mg | ORAL_CAPSULE | Freq: Every day | ORAL | 3 refills | Status: DC

## 2018-08-18 NOTE — Assessment & Plan Note (Signed)
Etiology unclear.  He has had a reassuring endoscopy, laryngoscopy and now bronchoscopy.  His culture data from the bronchoscopy is so far unrevealing.  The AFB culture is the only thing still pending; we will follow this to complete this and given his history of a positive QuantiFERON gold. we will follow him for resolution, question whether he may need further testing.  He does have calcified lymphadenopathy especially in the left hilum, consider this as a possible etiology although I did not see any blood coming from the left-sided airways on his bronchoscopy.

## 2018-08-18 NOTE — Assessment & Plan Note (Signed)
His spirometry shows a normal FVC and FEV1 but the ratio could be consistent with mild obstruction.  His lung volumes are borderline for hyperinflation.  I think it is reasonable to do a trial of bronchodilator to see if he benefits.  We will try albuterol and keep track of his breathing.  If he benefits then we could consider scheduled bronchodilator therapy.

## 2018-08-18 NOTE — Progress Notes (Signed)
Subjective:    Patient ID: Samuel Jacobs, male    DOB: March 24, 1951, 68 y.o.   MRN: [id number]  HPI 68 year old never smoker with a history of hypertension, hyperlipidemia, allergic rhinitis GERD, seizures, sleep apnea not on CPAP.  He is referred today for evaluation of hemoptysis.  He reports that he has been seeing blood in some of his sputum, streaks in his phlegm, for about 6 months. Mostly in the am, but can happen during the day also. He has seen some epistaxis as well, some crusting blood. Sometimes hears wheeze at night. Can lose his voice in the evening. Still has GERD sx, on protonix bid.   He has had some exercise chest discomfort - saw Dr Nadyne Coombes and had a reassuring cardiac stress test  A CT chest was done on 11/26 last 19 that I have reviewed.  This shows some faintly calcified granulomas scattered throughout both lung fields.  He also has some calcified left hilar lymphadenopathy.  No suspicious lung nodules or infiltrates seen.   ROV 08/18/18 --this is a follow-up visit for 68 year old never smoker who was having cough and hemoptysis.  He is also had some exercise dyspnea and chest discomfort, reassuring cardiac evaluation as above.  We performed bronchoscopy on 1/16.  There was no endobronchial lesion.  There was some blood noted in the bronchus intermedius just distal to the right upper lobe takeoff but unclear whether this is been the source of his hemoptysis.  Endobronchial biopsies were unremarkable.  Smears were all negative, fungal culture negative, cell count normal.  The AFB final cultures are still pending.  He has a CT scan that shows calcified granulomatous disease and calcified hilar lymphadenopathy which prompted an ACE level 07/20/2018 that was normal.  His QuantiFERON gold from the same date was positive.  He had some dyspnea on 2/5 that prompted an ER visit when he had a eye pain, maybe some anxiety associated with it.  A repeat CT chest was done that showed no evidence of  pulmonary embolism, a thoracic aortic aneurysm without dissection, question some evidence of esophagitis, left lower lobe atelectasis  PFT were done today and I have reviewed, these show possible mild obstruction with some associated hyperinflation.  His diffusion capacity is normal.  Every morning he still spits out some dark blood, he occasionally sees some blood from his nose. Doesn't necessarily cough it up from his chest. He doesn';t think it is gastric, but he does have breakthrough GERD even on pantoprazole bid + tums. He had a polyp removed by Dr Henrene Pastor about 6 months ago. He had a reassuring lary by Dr Janace Hoard, denies any gingival blood.      Review of Systems  Constitutional: Negative for fever and unexpected weight change.  HENT: Negative for congestion, dental problem, ear pain, nosebleeds, postnasal drip, rhinorrhea, sinus pressure, sneezing, sore throat and trouble swallowing.   Eyes: Negative for redness and itching.  Respiratory: Positive for cough, chest tightness, shortness of breath and wheezing.   Cardiovascular: Negative for palpitations and leg swelling.  Gastrointestinal: Negative for nausea and vomiting.  Genitourinary: Negative for dysuria.  Musculoskeletal: Negative for joint swelling.  Skin: Negative for rash.  Neurological: Negative for headaches.  Hematological: Does not bruise/bleed easily.  Psychiatric/Behavioral: Negative for dysphoric mood. The patient is not nervous/anxious.     Past Medical History:  Diagnosis Date  . Allergic rhinitis   . Allergy   . Anxiety   . Arthritis   . Carpal tunnel syndrome  09-12-15 no longer a problem.  . Colon polyps   . Complication of anesthesia    one time awaken- not with last procedure done.  . Depression   . ED (erectile dysfunction)   . GERD (gastroesophageal reflux disease)   . HLD (hyperlipidemia)   . Insomnia   . Seizure ([place])    08-13-15 per pt his last seizure was "about 1 year ago".  maw  . Sleep apnea      pt does not wear c-pap     Family History  Problem Relation Age of Onset  . Breast cancer Mother        mets to liver  . Stomach cancer Father   . Epilepsy Sister   . Cancer Maternal Grandmother        type unknown  . Cancer Maternal Grandfather        type unknown  . Cancer Paternal Grandfather        type unknown  . Cancer Paternal Grandmother        type unknown  . Colon cancer Neg Hx   . Esophageal cancer Neg Hx   . Rectal cancer Neg Hx   . Prostate cancer Neg Hx      Social History   Socioeconomic History  . Marital status: Married    Spouse name: Samuel Jacobs  . Number of children: 3  . Years of education: college  . Highest education level: Not on file  Occupational History  . Occupation: semi-retired  Social Needs  . Financial resource strain: Not on file  . Food insecurity:    Worry: Not on file    Inability: Not on file  . Transportation needs:    Medical: Not on file    Non-medical: Not on file  Tobacco Use  . Smoking status: Never Smoker  . Smokeless tobacco: Never Used  Substance and Sexual Activity  . Alcohol use: No    Alcohol/week: 0.0 standard drinks    Comment: past -none in 7-8 yrs social only  . Drug use: No  . Sexual activity: Not on file  Lifestyle  . Physical activity:    Days per week: Not on file    Minutes per session: Not on file  . Stress: Not on file  Relationships  . Social connections:    Talks on phone: Not on file    Gets together: Not on file    Attends religious service: Not on file    Active member of club or organization: Not on file    Attends meetings of clubs or organizations: Not on file    Relationship status: Not on file  . Intimate partner violence:    Fear of current or ex partner: Not on file    Emotionally abused: Not on file    Physically abused: Not on file    Forced sexual activity: Not on file  Other Topics Concern  . Not on file  Social History Narrative   Patient lives at home with wife  Samuel Jacobs.    Patient has retired and works part time at [employer]   Patient has 3 children.    Patient has a college education.   Patient is right handed.  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  No military  No known TB or silica exposure   Allergies  Allergen Reactions  . Sulfur Nausea Only     Outpatient Medications Prior to Visit  Medication Sig Dispense Refill  . fluticasone (FLONASE) 50 MCG/ACT nasal spray Place 1 spray into both nostrils daily as needed for allergies. Reported on 08/13/2015    . HYDROcodone-acetaminophen (NORCO/VICODIN) 5-325 MG tablet Take 1 tablet by mouth every 8 (eight) hours as needed for moderate pain.   0  . irbesartan (AVAPRO) 300 MG tablet TAKE 1 TABLET BY MOUTH DAILY *INCREASED DOSE*  6  . Lacosamide (VIMPAT) 150 MG TABS Take 1 tablet (150 mg total) by mouth 2 (two) times daily. 180 tablet 4  . meloxicam (MOBIC) 15 MG tablet TAKE 1 TABLET BY MOUTH EVERY DAY 30 tablet 0  . metoprolol succinate (TOPROL-XL) 25 MG 24 hr tablet Take 25 mg by mouth daily.    . pantoprazole (PROTONIX) 40 MG tablet Take 40 mg by mouth 2 (two) times daily.  4  . ranitidine (ZANTAC) 300 MG tablet Take 300 mg by mouth daily as needed for heartburn.    . traZODone (DESYREL) 100 MG tablet TAKE 1 TABLET BY MOUTH EVERYDAY AT BEDTIME (Patient taking differently: Take 100 mg by mouth at bedtime. ) 90 tablet 3   No facility-administered medications prior to visit.         Objective:   Physical Exam Vitals:   08/18/18 1556  BP: 134/84  Pulse: 66  SpO2: 99%  Weight: 262 lb (118.8 kg)  Height: 5\' 11"  (1.803 m)   Gen: Pleasant, well-nourished, in no distress,  normal affect  ENT: No lesions,  mouth clear,  oropharynx clear, some irritated nasal mucosa, no blood  Neck: No JVD, no stridor  Lungs: No use of accessory muscles, no crackles or wheezing on normal respiration, no wheeze on forced expiration  Cardiovascular: RRR, heart  sounds normal, no murmur or gallops, trace peripheral edema  Musculoskeletal: No deformities, no cyanosis or clubbing  Neuro: alert, awake, non focal  Skin: Warm, no lesions or rash      Assessment & Plan:  Hemoptysis Etiology unclear.  He has had a reassuring endoscopy, laryngoscopy and now bronchoscopy.  His culture data from the bronchoscopy is so far unrevealing.  The AFB culture is the only thing still pending; we will follow this to complete this and given his history of a positive QuantiFERON gold. we will follow him for resolution, question whether he may need further testing.  He does have calcified lymphadenopathy especially in the left hilum, consider this as a possible etiology although I did not see any blood coming from the left-sided airways on his bronchoscopy.  TB lung, latent He does not know of any overt exposure.  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  He did have infiltrates on CT chest but he does have calcified left hilar lymphadenopathy, question whether this represents his exposure.  No evidence for active TB.  He is agreeable to treatment and I will start rifampin 600 mg daily for the next 4 months.  Dyspnea His spirometry shows a normal FVC and FEV1 but the ratio could be consistent with mild obstruction.  His lung volumes are borderline for hyperinflation.  I think it is reasonable to do a trial of bronchodilator to see if he benefits.  We will try albuterol and keep track of his breathing.  If he benefits then we could consider scheduled bronchodilator therapy.  Baltazar Apo, MD, PhD 08/18/2018, 5:08 PM [facility] [phone] or if no answer [phone]

## 2018-08-18 NOTE — Patient Instructions (Signed)
We will do a trial of albuterol 2 puffs as needed for shortness of breath.  You can try taking this 10 minutes before exertion or in response to shortness of breath.  Keep track of whether it helps you so that we can talk about it next time. Start rifampin 600 mg once daily.  We will stay on this medication for 4 months Please keep track of how often you see blood.  Keep track also of whether this is in your mouth only or whether you are actually coughing it up.  We will discuss more next time. Follow with Dr Lamonte Sakai in 1 month or next available.

## 2018-08-18 NOTE — Progress Notes (Signed)
Full PFT performed today. °

## 2018-08-18 NOTE — Assessment & Plan Note (Signed)
He does not know of any overt exposure.  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  He did have infiltrates on CT chest but he does have calcified left hilar lymphadenopathy, question whether this represents his exposure.  No evidence for active TB.  He is agreeable to treatment and I will start rifampin 600 mg daily for the next 4 months.

## 2018-08-28 ENCOUNTER — Other Ambulatory Visit: Payer: Self-pay | Admitting: Podiatry

## 2018-09-01 ENCOUNTER — Telehealth: Payer: Self-pay | Admitting: Internal Medicine

## 2018-09-01 NOTE — Telephone Encounter (Signed)
Pt reports he has been having rectal bleeding for about a month, pt is not sure if he has a hemorrhoid or fissure. Pt requesting to be seen. Pt scheduled to see Dr. Henrene Pastor 2/26@9 :15am. Pt aware of appt.

## 2018-09-01 NOTE — Telephone Encounter (Signed)
Pt is a Dr. Henrene Pastor pt.  Pt would like to be seen for hemorrhoid.  Please advise scheduling.

## 2018-09-06 ENCOUNTER — Encounter: Payer: Self-pay | Admitting: Internal Medicine

## 2018-09-06 ENCOUNTER — Ambulatory Visit: Payer: Managed Care, Other (non HMO) | Admitting: Internal Medicine

## 2018-09-06 VITALS — BP 130/72 | HR 76 | Ht 71.0 in | Wt 257.0 lb

## 2018-09-06 DIAGNOSIS — K219 Gastro-esophageal reflux disease without esophagitis: Secondary | ICD-10-CM | POA: Diagnosis not present

## 2018-09-06 DIAGNOSIS — Z86010 Personal history of colonic polyps: Secondary | ICD-10-CM

## 2018-09-06 DIAGNOSIS — K602 Anal fissure, unspecified: Secondary | ICD-10-CM

## 2018-09-06 MED ORDER — DILTIAZEM GEL 2 %: CUTANEOUS | 3 refills | Status: DC

## 2018-09-06 NOTE — Patient Instructions (Signed)
  We have sent the following medications to [facility] for you to pick up at your convenience:  Nitroglycerin  Use 2 tablespoons of Metamucil daily in water or juice.  Take sitz baths multiple times a day

## 2018-09-06 NOTE — Progress Notes (Signed)
HISTORY OF PRESENT ILLNESS:  Samuel Jacobs is a 68 y.o. 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  He was last evaluated in this office March 02, 2018 regarding Hemoccult positive stool.  See that dictation for details.  Colonoscopy and upper endoscopy were performed April 10, 2018.  Colonoscopy was normal except for left-sided diverticulosis and internal hemorrhoids.  Follow-up in 5 years given history of adenomatous polyps recommended.  Upper endoscopy was normal except for benign fundic gland type polyps which were biopsy confirmed.  He has continued on PPI.  Patient presents today with a new complaint of rectal pain which began approximately 2 weeks ago.  There has been associated bleeding.  He does mention that over the past month his stools have been loose.  His baseline is 2 bowel movements per day.  Currently 5 or 6 bowel movements per day.  Review of blood work from August 16, 2018 reveals unremarkable basic metabolic panel except for glucose 146.  Normal CBC with hemoglobin 15.8.  He has continued on pantoprazole and ranitidine to control GERD symptoms.  REVIEW OF SYSTEMS:  All non-GI ROS negative unless otherwise stated in the HPI except for anxiety  Past Medical History:  Diagnosis Date  . Allergic rhinitis   . Allergy   . Anxiety   . Arthritis   . Carpal tunnel syndrome    09-12-15 no longer a problem.  . Colon polyps   . Complication of anesthesia    one time awaken- not with last procedure done.  . Depression   . ED (erectile dysfunction)   . GERD (gastroesophageal reflux disease)   . HLD (hyperlipidemia)   . Insomnia   . Seizure ([place])    08-13-15 per pt his last seizure was "about 1 year ago".  maw  . Sleep apnea    pt does not wear c-pap    Past Surgical History:  Procedure Laterality Date  . CHOLECYSTECTOMY  11/07/2015  . COLONOSCOPY    . EUS N/A 09/18/2015   Procedure: UPPER ENDOSCOPIC ULTRASOUND (EUS)  LINEAR;  Surgeon: Samuel Banister, MD;  Location: [facility] ENDOSCOPY;  Service: Endoscopy;  Laterality: N/A;  . TONSILLECTOMY    . 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  Marland Kitchen UPPER GASTROINTESTINAL ENDOSCOPY    . VIDEO BRONCHOSCOPY Bilateral 07/27/2018   Procedure: VIDEO BRONCHOSCOPY WITHOUT FLUORO;  Surgeon: Samuel Gobble, MD;  Location: [facility] ENDOSCOPY;  Service: Cardiopulmonary;  Laterality: Bilateral;  . [place] EXTRACTION      Social History Samuel Jacobs  reports that he has never smoked. He has never used smokeless tobacco. He reports that he does not drink alcohol or use drugs.  family history includes Breast cancer in his mother; Cancer in his maternal grandfather, maternal grandmother, paternal grandfather, and paternal grandmother; Epilepsy in his sister; Stomach cancer in his father.  Allergies  Allergen Reactions  . Sulfur Nausea Only       PHYSICAL EXAMINATION: Vital signs: BP 130/72   Pulse 76   Ht 5\' 11"  (1.803 m)   Wt 257 lb (116.6 kg)   BMI 35.84 kg/m   Constitutional: generally well-appearing, no acute distress Psychiatric: alert and oriented x3, cooperative Eyes: extraocular movements intact, anicteric, conjunctiva pink Mouth: oral pharynx moist, no lesions Neck: supple no lymphadenopathy Cardiovascular: heart regular rate and rhythm, no murmur Lungs: clear to auscultation bilaterally Abdomen: soft, nontender, nondistended, no obvious ascites, no peritoneal signs, normal bowel sounds,  no organomegaly Rectal: No external abnormalities.  Tender posterior anal fissure. Extremities: no clubbing, cyanosis, or lower extremity edema bilaterally Skin: no lesions on visible extremities Neuro: No focal deficits. No asterixis.    ASSESSMENT:  1.  Symptomatic anal fissure. 2.  Change in bowel habits with a tendency toward more frequent loose stools over the past month 3.  GERD.  On PPI and H2 receptor antagonist therapy.  Recent upper  endoscopy negative except for benign fundic gland polyps 4.  History of adenomatous colon polyps.  Last colonoscopy negative for neoplasia   PLAN:  1.  Metamucil 2 tablespoons daily to improve stool consistency 2.  Sitz baths daily to increase blood flow and promote fissure healing 3.  Prescribe diltiazem 2% ointment 5 times daily as I instructed him how to apply.  Will treat for 6 weeks.  I reviewed the, effects, side effects, and risks of the drug 4.  Discussed surgical options for patient she does not respond to aggressive medical therapy 5.  Routine colonoscopy around 2024 6.  Routine GI follow-up regarding chronic GERD in 1 year 7.  Ongoing general medical care with Dr. Eartha Jacobs

## 2018-09-08 LAB — ACID FAST CULTURE WITH REFLEXED SENSITIVITIES: ACID FAST CULTURE - AFSCU3: NEGATIVE

## 2018-09-08 LAB — ACID FAST CULTURE WITH REFLEXED SENSITIVITIES (MYCOBACTERIA)

## 2018-09-12 ENCOUNTER — Other Ambulatory Visit: Payer: Self-pay | Admitting: Neurology

## 2018-09-12 ENCOUNTER — Telehealth: Payer: Self-pay

## 2018-09-15 ENCOUNTER — Encounter: Payer: Self-pay | Admitting: Emergency Medicine

## 2018-09-15 ENCOUNTER — Ambulatory Visit: Payer: Managed Care, Other (non HMO) | Admitting: Emergency Medicine

## 2018-09-15 DIAGNOSIS — R042 Hemoptysis: Secondary | ICD-10-CM

## 2018-09-15 DIAGNOSIS — R0602 Shortness of breath: Secondary | ICD-10-CM

## 2018-09-15 DIAGNOSIS — Z227 Latent tuberculosis: Secondary | ICD-10-CM

## 2018-09-15 DIAGNOSIS — R590 Localized enlarged lymph nodes: Secondary | ICD-10-CM | POA: Insufficient documentation

## 2018-09-15 NOTE — Assessment & Plan Note (Signed)
With calcifications noted on CT.  No clear evidence that this was contributing to his hemoptysis.  Airway inspection was reassuring.  Also no clear evidence that it was related to his latent TB although I suppose this is possible.  We will continue to follow him clinically, let this determine when or if he needs repeat imaging

## 2018-09-15 NOTE — Assessment & Plan Note (Signed)
Seems to be improved since he started using the albuterol as needed.  We will continue this.

## 2018-09-15 NOTE — Assessment & Plan Note (Signed)
He is tolerating the rifampin.  We will continue it for 4 months total.

## 2018-09-15 NOTE — Progress Notes (Signed)
Subjective:    Patient ID: Samuel Jacobs, male    DOB: 22-Dec-1950, 68 y.o.   MRN: [id number]  HPI 68 year old never smoker with a history of hypertension, hyperlipidemia, allergic rhinitis GERD, seizures, sleep apnea not on CPAP.  He is referred today for evaluation of hemoptysis.  He reports that he has been seeing blood in some of his sputum, streaks in his phlegm, for about 6 months. Mostly in the am, but can happen during the day also. He has seen some epistaxis as well, some crusting blood. Sometimes hears wheeze at night. Can lose his voice in the evening. Still has GERD sx, on protonix bid.   He has had some exercise chest discomfort - saw Dr Nadyne Coombes and had a reassuring cardiac stress test  A CT chest was done on 11/26 last 19 that I have reviewed.  This shows some faintly calcified granulomas scattered throughout both lung fields.  He also has some calcified left hilar lymphadenopathy.  No suspicious lung nodules or infiltrates seen.   ROV 08/18/18 --this is a follow-up visit for 67 year old never smoker who was having cough and hemoptysis.  He is also had some exercise dyspnea and chest discomfort, reassuring cardiac evaluation as above.  We performed bronchoscopy on 1/16.  There was no endobronchial lesion.  There was some blood noted in the bronchus intermedius just distal to the right upper lobe takeoff but unclear whether this is been the source of his hemoptysis.  Endobronchial biopsies were unremarkable.  Smears were all negative, fungal culture negative, cell count normal.  The AFB final cultures are still pending.  He has a CT scan that shows calcified granulomatous disease and calcified hilar lymphadenopathy which prompted an ACE level 07/20/2018 that was normal.  His QuantiFERON gold from the same date was positive.  He had some dyspnea on 2/5 that prompted an ER visit when he had a eye pain, maybe some anxiety associated with it.  A repeat CT chest was done that showed no evidence of  pulmonary embolism, a thoracic aortic aneurysm without dissection, question some evidence of esophagitis, left lower lobe atelectasis  PFT were done today and I have reviewed, these show possible mild obstruction with some associated hyperinflation.  His diffusion capacity is normal.  Every morning he still spits out some dark blood, he occasionally sees some blood from his nose. Doesn't necessarily cough it up from his chest. He doesn';t think it is gastric, but he does have breakthrough GERD even on pantoprazole bid + tums. He had a polyp removed by Dr Henrene Pastor about 6 months ago. He had a reassuring lary by Dr Janace Hoard, denies any gingival blood.   ROV 09/15/2018 --68 year old never smoker with a history of cough and hemoptysis.  He had a reassuring bronchoscopy as detailed above.  He also has a CT scan of his chest that shows some calcified granulomatous disease and calcified hilar lymphadenopathy.  He has a positive QuantiFERON gold.  His AFB from the bronchoscopy is negative.  I started him on rifampin to treat latent TB.  I also gave him albuterol to try to see if it helps symptomatically since he has some mild obstruction on pulmonary function testing.  Today he reports that he is feeling better - he is not having dyspnea. His bleeding has resolved. He has benefited from the albuterol, using about 1x a day.     Review of Systems  Constitutional: Negative for fever and unexpected weight change.  HENT: Negative for congestion, dental problem,  ear pain, nosebleeds, postnasal drip, rhinorrhea, sinus pressure, sneezing, sore throat and trouble swallowing.   Eyes: Negative for redness and itching.  Respiratory: Positive for cough, chest tightness, shortness of breath and wheezing.   Cardiovascular: Negative for palpitations and leg swelling.  Gastrointestinal: Negative for nausea and vomiting.  Genitourinary: Negative for dysuria.  Musculoskeletal: Negative for joint swelling.  Skin: Negative for rash.    Neurological: Negative for headaches.  Hematological: Does not bruise/bleed easily.  Psychiatric/Behavioral: Negative for dysphoric mood. The patient is not nervous/anxious.      Allergies  Allergen Reactions  . Sulfur Nausea Only     Outpatient Medications Prior to Visit  Medication Sig Dispense Refill  . albuterol (PROVENTIL HFA;VENTOLIN HFA) 108 (90 Base) MCG/ACT inhaler Inhale 2 puffs into the lungs every 4 (four) hours as needed for wheezing or shortness of breath. 1 Inhaler 5  . atorvastatin (LIPITOR) 10 MG tablet Take 10 mg by mouth daily.    Marland Kitchen diltiazem 2 % GEL Apply 5 times a day 30 g 3  . diltiazem 2 % GEL Apply to area 5 times a day 30 g 3  . fluticasone (FLONASE) 50 MCG/ACT nasal spray Place 1 spray into both nostrils daily as needed for allergies. Reported on 08/13/2015    . hydrochlorothiazide (HYDRODIURIL) 12.5 MG tablet Take 12.5 mg by mouth daily.    Marland Kitchen HYDROcodone-acetaminophen (NORCO/VICODIN) 5-325 MG tablet Take 1 tablet by mouth every 8 (eight) hours as needed for moderate pain.   0  . irbesartan (AVAPRO) 300 MG tablet TAKE 1 TABLET BY MOUTH DAILY *INCREASED DOSE*  6  . Lacosamide (VIMPAT) 150 MG TABS Take 1 tablet (150 mg total) by mouth 2 (two) times daily. Please schedule yearly follow up in October 2020. 180 tablet 1  . meloxicam (MOBIC) 15 MG tablet TAKE 1 TABLET BY MOUTH EVERY DAY 30 tablet 0  . metoprolol succinate (TOPROL-XL) 25 MG 24 hr tablet Take 25 mg by mouth daily.    . pantoprazole (PROTONIX) 40 MG tablet Take 40 mg by mouth 2 (two) times daily.  4  . ranitidine (ZANTAC) 300 MG tablet Take 300 mg by mouth daily as needed for heartburn.    . rifampin (RIFADIN) 300 MG capsule Take 2 capsules (600 mg total) by mouth daily. 60 capsule 3  . traZODone (DESYREL) 100 MG tablet TAKE 1 TABLET BY MOUTH EVERYDAY AT BEDTIME (Patient taking differently: Take 100 mg by mouth at bedtime. ) 90 tablet 3   No facility-administered medications prior to visit.          Objective:   Physical Exam Vitals:   09/15/18 1624  BP: 116/68  Pulse: 60  SpO2: 100%  Weight: 256 lb (116.1 kg)   Gen: Pleasant, well-nourished, in no distress,  normal affect  ENT: No lesions,  mouth clear,  oropharynx clear, some irritated nasal mucosa, no blood  Neck: No JVD, no stridor  Lungs: No use of accessory muscles, no crackles or wheezing on normal respiration, no wheeze on forced expiration  Cardiovascular: RRR, heart sounds normal, no murmur or gallops, trace peripheral edema  Musculoskeletal: No deformities, no cyanosis or clubbing  Neuro: alert, awake, non focal  Skin: Warm, no lesions or rash      Assessment & Plan:  TB lung, latent He is tolerating the rifampin.  We will continue it for 4 months total.  Hemoptysis His hemoptysis has resolved.  The cause remains unclear, question periodontal or bronchitic.  Bronchoscopy, endoscopy, laryngoscopy all reassuring  we will continue to follow him  Dyspnea Seems to be improved since he started using the albuterol as needed.  We will continue this.  Hilar lymphadenopathy With calcifications noted on CT.  No clear evidence that this was contributing to his hemoptysis.  Airway inspection was reassuring.  Also no clear evidence that it was related to his latent TB although I suppose this is possible.  We will continue to follow him clinically, let this determine when or if he needs repeat imaging  Baltazar Apo, MD, PhD 09/15/2018, 5:10 PM [facility] [place] [phone] or if no answer [phone]

## 2018-09-15 NOTE — Assessment & Plan Note (Signed)
His hemoptysis has resolved.  The cause remains unclear, question periodontal or bronchitic.  Bronchoscopy, endoscopy, laryngoscopy all reassuring we will continue to follow him

## 2018-09-15 NOTE — Patient Instructions (Signed)
Please continue your rifampin every day as you have been taking it.  We will continue this until early June Keep your albuterol available to use 2 puffs if needed for shortness of breath, chest tightness, wheezing.  You could also consider taking this before exercise to see if it helps your breathing. Please call our office if you develop a change in your breathing or if you start to cough up blood Follow with Dr. Lamonte Sakai in early June 2020 or sooner if you have any problems.

## 2018-09-20 ENCOUNTER — Telehealth: Payer: Self-pay | Admitting: Emergency Medicine

## 2018-09-20 NOTE — Telephone Encounter (Signed)
Called and spoke with pt who stated he began coughing up blood again and did not know what to do about it. I advised pt to go to the ED to be further evaluated. Pt expressed understanding and stated he would. Nothing further needed.

## 2018-10-25 LAB — FUNGAL ORGANISM REFLEX

## 2018-10-25 LAB — FUNGUS CULTURE RESULT

## 2018-10-25 LAB — FUNGUS CULTURE WITH STAIN

## 2018-11-17 ENCOUNTER — Other Ambulatory Visit: Payer: Self-pay | Admitting: Neurology

## 2019-04-05 ENCOUNTER — Other Ambulatory Visit: Payer: Self-pay

## 2019-04-05 ENCOUNTER — Telehealth: Payer: Self-pay

## 2019-04-05 MED ORDER — ATORVASTATIN CALCIUM 10 MG PO TABS: 10.0000 mg | ORAL_TABLET | Freq: Every day | ORAL | 0 refills | Status: DC

## 2019-05-22 ENCOUNTER — Other Ambulatory Visit: Payer: Self-pay | Admitting: Neurology

## 2019-06-12 ENCOUNTER — Other Ambulatory Visit: Payer: Self-pay | Admitting: Neurology

## 2019-06-26 ENCOUNTER — Other Ambulatory Visit: Payer: Self-pay | Admitting: Cardiology

## 2019-06-30 ENCOUNTER — Other Ambulatory Visit: Payer: Self-pay | Admitting: Neurology

## 2019-07-25 ENCOUNTER — Other Ambulatory Visit: Payer: Self-pay | Admitting: *Deleted

## 2019-07-25 MED ORDER — VIMPAT 150 MG PO TABS: 1.0000 | ORAL_TABLET | Freq: Two times a day (BID) | ORAL | 0 refills | Status: DC

## 2019-09-20 ENCOUNTER — Other Ambulatory Visit: Payer: Self-pay | Admitting: Cardiology

## 2019-10-02 ENCOUNTER — Other Ambulatory Visit: Payer: Self-pay | Admitting: Neurology

## 2019-11-04 ENCOUNTER — Other Ambulatory Visit: Payer: Self-pay | Admitting: Cardiology

## 2019-11-05 ENCOUNTER — Other Ambulatory Visit: Payer: Self-pay | Admitting: Neurology

## 2019-11-06 ENCOUNTER — Other Ambulatory Visit: Payer: Self-pay | Admitting: *Deleted

## 2019-11-06 ENCOUNTER — Other Ambulatory Visit: Payer: Self-pay | Admitting: Neurology

## 2019-11-06 MED ORDER — VIMPAT 150 MG PO TABS: 1.0000 | ORAL_TABLET | Freq: Two times a day (BID) | ORAL | 0 refills | Status: DC

## 2019-11-06 NOTE — Telephone Encounter (Signed)
I returned the call to the patient to let him know the refill will be sent to the pharmacy.

## 2019-11-06 NOTE — Telephone Encounter (Signed)
I returned the call to the patient. Says he spoke to Dr. Dagmar Hait at his last visit about his Vimpat and trazodone. He is unsure if his PCP is going to send in the refills. He is going to call his PCP office for clarification. He will call back to make an appt, if she is not going to manage.  If patient calls back for an appt, please schedule follow up with Butler Denmark, NP and let me know the date of his appt.

## 2019-11-06 NOTE — Addendum Note (Signed)
Addended by: Desmond Lope on: 11/06/2019 04:27 PM   Modules accepted: Orders

## 2019-11-06 NOTE — Telephone Encounter (Signed)
Pt has called and the message from [facility] was read to him,  Pt is just asking the the script for the Lacosamide (VIMPAT) 150 MG TABS be sent to his PCP Dr Dagmar Hait

## 2019-11-06 NOTE — Telephone Encounter (Signed)
Pt is needing a refill on his Lacosamide (VIMPAT) 150 MG TABS sent in to the [facility] on [address]

## 2019-11-06 NOTE — Telephone Encounter (Addendum)
The patient was last seen 04/26/2018. There is a note on his last refill that he was going to speak to his PCP about managing this medication. If the PCP was not comfortable with it, then he was going to call back to schedule an appt. There is no appt pending.  I called the patient and left a message.  If he would like our office to continue prescribing Vimpat, then an appt will need to be scheduled with Butler Denmark, NP. Once scheduled, we can provide refills to last until his appt date.

## 2019-11-06 NOTE — Telephone Encounter (Addendum)
Pt's wife called back and scheduled an appt for him with NP for 4/29 8:15am but she would like to know if a day or two worth of Vimpat can be called in for him because he will not have any more left after tonight. Please advise.

## 2019-11-08 ENCOUNTER — Other Ambulatory Visit: Payer: Self-pay

## 2019-11-08 ENCOUNTER — Ambulatory Visit (INDEPENDENT_AMBULATORY_CARE_PROVIDER_SITE_OTHER): Payer: Managed Care, Other (non HMO) | Admitting: Neurology

## 2019-11-08 ENCOUNTER — Encounter: Payer: Self-pay | Admitting: Neurology

## 2019-11-08 VITALS — BP 148/84 | HR 60 | Temp 97.4°F | Ht 71.0 in | Wt 267.0 lb

## 2019-11-08 DIAGNOSIS — G40109 Localization-related (focal) (partial) symptomatic epilepsy and epileptic syndromes with simple partial seizures, not intractable, without status epilepticus: Secondary | ICD-10-CM | POA: Diagnosis not present

## 2019-11-08 NOTE — Patient Instructions (Signed)
It as great to see you today  Continue Vimpat Continue Trazodone  See you in 1 year  Call for seizures

## 2019-11-08 NOTE — Progress Notes (Signed)
PATIENT: Samuel Jacobs DOB: [birth year]  REASON FOR VISIT: follow up HISTORY FROM: patient  HISTORY OF PRESENT ILLNESS: Today 11/08/19  HISTORY  HISTORY OF PRESENT ILLNESS: HISTORY 08/21/15 (Per Dr. Krista Blue notes): Mr. Kinloch is a 69 year old male with a history of complex partial seizure seizure He continue have recurrent seizures while taking Depakote, eventually Vimpat was added on in 2015, Depakote was weaned off, while taking Vimpat 150 mg twice a day, he denies any seizure event. 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 He complains of dizziness, feeling funny with higher dose of Vimpat Previous sleep study in 2011 showed mild obstructive sleep apnea, CPAP was not recommended at that time. He has quit tramadol, he is now taking hydrocodone for low back pain. No longer has frequent headaches, was to go back on lower dose of Vimpat  Last seizure was in 2015, his seizure consistent of staring, confusion. He has difficulty falling to sleep due to his shift job, tried over-the-counter Tylenol PM without benefit, he also complains of depression anxiety  Update February 21 2017: He is taking Vimpat 150 twice a day, there was no recurrent seizure activity, he is driving, trazodone [id number] mg for sleep,  UPDATE Apr 26 2018: He is doing very well with Vimpat 150 mg twice a day, there was no recurrent seizure, continue trazodone.  I reviewed the laboratory from [facility] in July 2019, normal CMP creatinine of 1.0, hemoglobin 15.2,  Update November 08, 2019 SS: He continues to do very well on Vimpat 150 mg twice a day.  Last seizure was in 2015.   He continues on trazodone 100 mg at bedtime for sleep.  His PCP is filling the trazodone.  He is tolerating Vimpat, his overall health has been good.  He works full-time, in [employer].  His PCP prefers he be seen here on an annual basis for seizures.  No new problems or concerns.  REVIEW OF SYSTEMS: Out of a complete 14 system review of symptoms, the patient complains only of the following symptoms, and all other reviewed systems are negative.  Seizures  ALLERGIES: Allergies  Allergen Reactions  . Sulfur Nausea Only    HOME MEDICATIONS: Outpatient Medications Prior to Visit  Medication Sig Dispense Refill  . albuterol (PROVENTIL HFA;VENTOLIN HFA) 108 (90 Base) MCG/ACT inhaler Inhale 2 puffs into the lungs every 4 (four) hours as needed for wheezing or shortness of breath. 1 Inhaler 5  . atorvastatin (LIPITOR) 10 MG tablet TAKE 1 TABLET BY MOUTH EVERY DAY 90 tablet 0  . fluticasone (FLONASE) 50 MCG/ACT nasal spray Place 1 spray into both nostrils daily as needed for allergies. Reported on 08/13/2015    . hydrochlorothiazide (HYDRODIURIL) 12.5 MG tablet Take 12.5 mg by mouth daily.    Marland Kitchen HYDROcodone-acetaminophen (NORCO/VICODIN) 5-325 MG tablet Take 1 tablet by mouth every 8 (eight) hours as needed for moderate pain.   0  . irbesartan (AVAPRO) 300 MG tablet TAKE 1 TABLET BY MOUTH DAILY *INCREASED DOSE*  6  . Lacosamide (VIMPAT) 150 MG TABS Take 1 tablet (150 mg total) by mouth 2 (two) times daily. 180 tablet 0  . meloxicam (MOBIC) 15  MG tablet TAKE 1 TABLET BY MOUTH EVERY DAY 30 tablet 0  . metoprolol succinate (TOPROL-XL) 25 MG 24 hr tablet Take 25 mg by mouth daily.    . pantoprazole (PROTONIX) 40 MG tablet Take 40 mg by mouth 2 (two) times daily.  4  . traZODone (DESYREL) 100 MG tablet Take one tablet daily at bedtime. Please call [phone] to schedule an appt or request refills from PCP. 90 tablet 0  . rifampin (RIFADIN) 300 MG capsule Take 2 capsules (600 mg total) by mouth  daily. (Patient not taking: Reported on 11/08/2019) 60 capsule 3  . diltiazem 2 % GEL Apply 5 times a day 30 g 3  . diltiazem 2 % GEL Apply to area 5 times a day 30 g 3  . ranitidine (ZANTAC) 300 MG tablet Take 300 mg by mouth daily as needed for heartburn.     No facility-administered medications prior to visit.    PAST MEDICAL HISTORY: Past Medical History:  Diagnosis Date  . Allergic rhinitis   . Allergy   . Anxiety   . Arthritis   . Carpal tunnel syndrome    09-12-15 no longer a problem.  . Colon polyps   . Complication of anesthesia    one time awaken- not with last procedure done.  . Depression   . ED (erectile dysfunction)   . GERD (gastroesophageal reflux disease)   . HLD (hyperlipidemia)   . Insomnia   . Seizure ([place])    08-13-15 per pt his last seizure was "about 1 year ago".  maw  . Sleep apnea    pt does not wear c-pap    PAST SURGICAL HISTORY: Past Surgical History:  Procedure Laterality Date  . CHOLECYSTECTOMY  11/07/2015  . COLONOSCOPY    . EUS N/A 09/18/2015   Procedure: UPPER ENDOSCOPIC ULTRASOUND (EUS) LINEAR;  Surgeon: Milus Banister, MD;  Location: [facility] ENDOSCOPY;  Service: Endoscopy;  Laterality: N/A;  . TONSILLECTOMY    . 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  Marland Kitchen UPPER GASTROINTESTINAL ENDOSCOPY    . VIDEO BRONCHOSCOPY Bilateral 07/27/2018   Procedure: VIDEO BRONCHOSCOPY WITHOUT FLUORO;  Surgeon: Collene Gobble, MD;  Location: [facility] ENDOSCOPY;  Service: Cardiopulmonary;  Laterality: Bilateral;  . WISDOM TOOTH EXTRACTION      FAMILY HISTORY: Family History  Problem Relation Age of Onset  . Breast cancer Mother        mets to liver  . Stomach cancer Father   . Epilepsy Sister   . Cancer Maternal Grandmother        type unknown  . Cancer Maternal Grandfather        type unknown  . Cancer Paternal Grandfather        type unknown  . Cancer Paternal Grandmother        type unknown  . Colon cancer Neg Hx   . Esophageal  cancer Neg Hx   . Rectal cancer Neg Hx   . Prostate cancer Neg Hx     SOCIAL HISTORY: Social History   Socioeconomic History  . Marital status: Married    Spouse name: Gailen Shelter  . Number of children: 3  . Years of education: college  . Highest education level: Not on file  Occupational History  . Occupation: semi-retired  Tobacco Use  . Smoking status: Never Smoker  . Smokeless tobacco: Never Used  Substance and Sexual Activity  . Alcohol use: No    Alcohol/week: 0.0 standard drinks  Comment: past -none in 7-8 yrs social only  . Drug use: No  . Sexual activity: Not on file  Other Topics Concern  . Not on file  Social History Narrative   Patient lives at home with wife Neoma Laming.    Patient has retired and works part time at [employer]   Patient has 3 children.    Patient has a college education.   Patient is right handed.   Social Determinants of Health   Financial Resource Strain:   . Difficulty of Paying Living Expenses:   Food Insecurity:   . Worried About Charity fundraiser in the Last Year:   . Arboriculturist in the Last Year:   Transportation Needs:   . Film/video editor (Medical):   Marland Kitchen Lack of Transportation (Non-Medical):   Physical Activity:   . Days of Exercise per Week:   . Minutes of Exercise per Session:   Stress:   . Feeling of Stress :   Social Connections:   . Frequency of Communication with Friends and Family:   . Frequency of Social Gatherings with Friends and Family:   . Attends Religious Services:   . Active Member of Clubs or Organizations:   . Attends Archivist Meetings:   Marland Kitchen Marital Status:   Intimate Partner Violence:   . Fear of Current or Ex-Partner:   . Emotionally Abused:   Marland Kitchen Physically Abused:   . Sexually Abused:    PHYSICAL EXAM  Vitals:   11/08/19 0755  BP: (!) 148/84  Pulse: 60  Temp: (!) 97.4 F (36.3 C)  Weight: 267 lb (121.1 kg)  Height: 5\' 11"  (1.803 m)   Body mass index is 37.24  kg/m.  Generalized: Well developed, in no acute distress   Neurological examination  Mentation: Alert oriented to time, place, history taking. Follows all commands speech and language fluent Cranial nerve II-XII: Pupils were equal round reactive to light. Extraocular movements were full, visual field were full on confrontational test. Facial sensation and strength were normal. Head turning and shoulder shrug  were normal and symmetric. Motor: The motor testing reveals 5 over 5 strength of all 4 extremities. Good symmetric motor tone is noted throughout.  Sensory: Sensory testing is intact to soft touch on all 4 extremities. No evidence of extinction is noted.  Coordination: Cerebellar testing reveals good finger-nose-finger and heel-to-shin bilaterally.  Gait and station: Gait is normal. Tandem gait is normal.  No drift is seen.  Reflexes: Deep tendon reflexes are symmetric and normal bilaterally.   DIAGNOSTIC DATA (LABS, IMAGING, TESTING) - I reviewed patient records, labs, notes, testing and imaging myself where available.  Lab Results  Component Value Date   WBC 8.3 08/16/2018   HGB 15.8 08/16/2018   HCT 47.8 08/16/2018   MCV 88.2 08/16/2018   PLT 290 08/16/2018      Component Value Date/Time   NA 139 08/16/2018 0500   NA 134 05/02/2014 1139   K 4.1 08/16/2018 0500   CL 105 08/16/2018 0500   CO2 27 08/16/2018 0500   GLUCOSE 146 (H) 08/16/2018 0500   BUN 12 08/16/2018 0500   BUN 16 05/02/2014 1139   CREATININE 1.10 08/16/2018 0500   CALCIUM 9.6 08/16/2018 0500   PROT 7.3 08/23/2016 0836   ALBUMIN 4.4 08/23/2016 0836   AST 24 08/23/2016 0836   ALT 25 08/23/2016 0836   ALKPHOS 63 08/23/2016 0836   BILITOT 0.3 08/23/2016 0836   GFRNONAA >60 08/16/2018 0500  GFRAA >60 08/16/2018 0500   No results found for: CHOL, HDL, LDLCALC, LDLDIRECT, TRIG, CHOLHDL No results found for: HGBA1C No results found for: VITAMINB12 No results found for: TSH    ASSESSMENT AND PLAN 69  y.o. year old male  has a past medical history of Allergic rhinitis, Allergy, Anxiety, Arthritis, Carpal tunnel syndrome, Colon polyps, Complication of anesthesia, Depression, ED (erectile dysfunction), GERD (gastroesophageal reflux disease), HLD (hyperlipidemia), Insomnia, Seizure ([place]), and Sleep apnea. here with:  1.  Epilepsy -Last seizure was in 2015 -Continue Vimpat 150 mg twice a day, refill sent on 11/06/2019 for 3 months, will fill going forward for him -Call for recurrent seizure, otherwise follow-up in 1 year or sooner if needed  2.  Chronic insomnia -Continue trazodone, coming from PCP  I spent 20 minutes of face-to-face and non-face-to-face time with patient.  This included previsit chart review, lab review, study review, order entry, electronic health record documentation, patient education.  Butler Denmark, AGNP-C, DNP 11/08/2019, 8:32 AM [facility] [address] [place], [place] [address] [phone]

## 2019-11-20 NOTE — Progress Notes (Signed)
I have reviewed and agreed above plan. 

## 2020-01-30 ENCOUNTER — Other Ambulatory Visit: Payer: Self-pay | Admitting: Neurology

## 2020-03-02 ENCOUNTER — Other Ambulatory Visit: Payer: Self-pay | Admitting: Cardiology

## 2020-05-26 ENCOUNTER — Other Ambulatory Visit: Payer: Self-pay | Admitting: Cardiology

## 2020-06-25 ENCOUNTER — Other Ambulatory Visit: Payer: Self-pay | Admitting: Cardiology

## 2020-08-04 ENCOUNTER — Other Ambulatory Visit: Payer: Self-pay | Admitting: Neurology

## 2020-09-02 ENCOUNTER — Observation Stay (HOSPITAL_COMMUNITY): Payer: Managed Care, Other (non HMO)

## 2020-09-02 ENCOUNTER — Other Ambulatory Visit: Payer: Self-pay

## 2020-09-02 ENCOUNTER — Observation Stay (HOSPITAL_COMMUNITY)
Admission: EM | Admit: 2020-09-02 | Discharge: 2020-09-03 | Disposition: A | Discharge status: Home / Self Care | Payer: Managed Care, Other (non HMO) | Attending: Internal Medicine | Admitting: Internal Medicine

## 2020-09-02 ENCOUNTER — Emergency Department (HOSPITAL_COMMUNITY): Payer: Managed Care, Other (non HMO)

## 2020-09-02 ENCOUNTER — Encounter (HOSPITAL_COMMUNITY): Payer: Self-pay | Admitting: Emergency Medicine

## 2020-09-02 ENCOUNTER — Ambulatory Visit (HOSPITAL_COMMUNITY)
Admission: EM | Admit: 2020-09-02 | Discharge: 2020-09-02 | Disposition: A | Discharge status: Home / Self Care | Payer: Managed Care, Other (non HMO)

## 2020-09-02 DIAGNOSIS — E041 Nontoxic single thyroid nodule: Secondary | ICD-10-CM | POA: Diagnosis present

## 2020-09-02 DIAGNOSIS — R55 Syncope and collapse: Secondary | ICD-10-CM | POA: Diagnosis present

## 2020-09-02 DIAGNOSIS — E782 Mixed hyperlipidemia: Secondary | ICD-10-CM | POA: Diagnosis present

## 2020-09-02 DIAGNOSIS — Z20822 Contact with and (suspected) exposure to covid-19: Secondary | ICD-10-CM | POA: Diagnosis not present

## 2020-09-02 DIAGNOSIS — S0285XA Fracture of orbit, unspecified, initial encounter for closed fracture: Secondary | ICD-10-CM

## 2020-09-02 DIAGNOSIS — S0232XA Fracture of orbital floor, left side, initial encounter for closed fracture: Secondary | ICD-10-CM | POA: Diagnosis not present

## 2020-09-02 DIAGNOSIS — G40109 Localization-related (focal) (partial) symptomatic epilepsy and epileptic syndromes with simple partial seizures, not intractable, without status epilepticus: Secondary | ICD-10-CM

## 2020-09-02 DIAGNOSIS — R739 Hyperglycemia, unspecified: Secondary | ICD-10-CM | POA: Diagnosis not present

## 2020-09-02 DIAGNOSIS — K2100 Gastro-esophageal reflux disease with esophagitis, without bleeding: Secondary | ICD-10-CM | POA: Diagnosis not present

## 2020-09-02 DIAGNOSIS — Z79899 Other long term (current) drug therapy: Secondary | ICD-10-CM | POA: Insufficient documentation

## 2020-09-02 DIAGNOSIS — Y92009 Unspecified place in unspecified non-institutional (private) residence as the place of occurrence of the external cause: Secondary | ICD-10-CM | POA: Diagnosis not present

## 2020-09-02 DIAGNOSIS — R079 Chest pain, unspecified: Principal | ICD-10-CM | POA: Diagnosis present

## 2020-09-02 DIAGNOSIS — I2699 Other pulmonary embolism without acute cor pulmonale: Secondary | ICD-10-CM | POA: Diagnosis not present

## 2020-09-02 DIAGNOSIS — S0121XA Laceration without foreign body of nose, initial encounter: Secondary | ICD-10-CM | POA: Insufficient documentation

## 2020-09-02 DIAGNOSIS — S0181XA Laceration without foreign body of other part of head, initial encounter: Secondary | ICD-10-CM

## 2020-09-02 DIAGNOSIS — W19XXXA Unspecified fall, initial encounter: Secondary | ICD-10-CM | POA: Diagnosis not present

## 2020-09-02 DIAGNOSIS — I10 Essential (primary) hypertension: Secondary | ICD-10-CM | POA: Diagnosis not present

## 2020-09-02 DIAGNOSIS — K219 Gastro-esophageal reflux disease without esophagitis: Secondary | ICD-10-CM | POA: Diagnosis present

## 2020-09-02 LAB — HEMOGLOBIN A1C
Hgb A1c MFr Bld: 6.6 % — ABNORMAL HIGH (ref 4.8–5.6)
Mean Plasma Glucose: 142.72 mg/dL

## 2020-09-02 LAB — TROPONIN I (HIGH SENSITIVITY)
Troponin I (High Sensitivity): 8 ng/L (ref <18)
Troponin I (High Sensitivity): 6 ng/L (ref <18)

## 2020-09-02 LAB — CBC
HCT: 44 % (ref 39.0–52.0)
Hemoglobin: 15.4 g/dL (ref 13.0–17.0)
MCH: 31.2 pg (ref 26.0–34.0)
MCHC: 35 g/dL (ref 30.0–36.0)
MCV: 89.1 fL (ref 80.0–100.0)
Platelets: 265 10*3/uL (ref 150–400)
RBC: 4.94 MIL/uL (ref 4.22–5.81)
RDW: 12.9 % (ref 11.5–15.5)
WBC: 10.6 10*3/uL — ABNORMAL HIGH (ref 4.0–10.5)
nRBC: 0 % (ref 0.0–0.2)

## 2020-09-02 LAB — BASIC METABOLIC PANEL
Anion gap: 9 (ref 5–15)
BUN: 22 mg/dL (ref 8–23)
CO2: 27 mmol/L (ref 22–32)
Calcium: 9.4 mg/dL (ref 8.9–10.3)
Chloride: 100 mmol/L (ref 98–111)
Creatinine, Ser: 1.17 mg/dL (ref 0.61–1.24)
GFR, Estimated: >60 mL/min (ref >60)
Glucose, Bld: 177 mg/dL — ABNORMAL HIGH (ref 70–99)
Potassium: 4.5 mmol/L (ref 3.5–5.1)
Sodium: 136 mmol/L (ref 135–145)

## 2020-09-02 LAB — URINALYSIS, ROUTINE W REFLEX MICROSCOPIC
Bilirubin Urine: NEGATIVE
Glucose, UA: NEGATIVE mg/dL
Hgb urine dipstick: NEGATIVE
Ketones, ur: NEGATIVE mg/dL
Leukocytes,Ua: NEGATIVE
Nitrite: NEGATIVE
Protein, ur: NEGATIVE mg/dL
Specific Gravity, Urine: 1.015 (ref 1.005–1.030)
pH: 6 (ref 5.0–8.0)

## 2020-09-02 LAB — RESP PANEL BY RT-PCR (FLU A&B, COVID) ARPGX2
Influenza A by PCR: NEGATIVE
Influenza B by PCR: NEGATIVE
SARS Coronavirus 2 by RT PCR: NEGATIVE

## 2020-09-02 MED ORDER — LORATADINE 10 MG PO TABS
10.0000 mg | ORAL_TABLET | Freq: Every day | ORAL | Status: DC
Start: 2020-09-03 — End: 2020-09-03
  Administered 2020-09-03: 10 mg via ORAL
  Filled 2020-09-02: qty 1

## 2020-09-02 MED ORDER — ACETAMINOPHEN 325 MG PO TABS: 650.0000 mg | ORAL_TABLET | Freq: Four times a day (QID) | ORAL | Status: DC | PRN

## 2020-09-02 MED ORDER — ACETAMINOPHEN 650 MG RE SUPP: 650.0000 mg | Freq: Four times a day (QID) | RECTAL | Status: DC | PRN

## 2020-09-02 MED ORDER — SODIUM CHLORIDE 0.9 % IV BOLUS
1000.0000 mL | Freq: Once | INTRAVENOUS | Status: AC
  Administered 2020-09-02: 1000 mL via INTRAVENOUS

## 2020-09-02 MED ORDER — HYDROCHLOROTHIAZIDE 25 MG PO TABS: 12.5000 mg | ORAL_TABLET | Freq: Every day | ORAL | Status: DC

## 2020-09-02 MED ORDER — APIXABAN 5 MG PO TABS
10.0000 mg | ORAL_TABLET | Freq: Two times a day (BID) | ORAL | Status: DC
  Administered 2020-09-02 – 2020-09-03 (×3): 10 mg via ORAL
  Filled 2020-09-02 (×3): qty 2

## 2020-09-02 MED ORDER — HYDROCODONE-ACETAMINOPHEN 5-325 MG PO TABS
1.0000 | ORAL_TABLET | ORAL | Status: DC | PRN
  Administered 2020-09-02 – 2020-09-03 (×2): 1 via ORAL
  Filled 2020-09-02 (×2): qty 1

## 2020-09-02 MED ORDER — ONDANSETRON HCL 4 MG PO TABS: 4.0000 mg | ORAL_TABLET | Freq: Four times a day (QID) | ORAL | Status: DC | PRN

## 2020-09-02 MED ORDER — LACOSAMIDE 50 MG PO TABS
150.0000 mg | ORAL_TABLET | Freq: Two times a day (BID) | ORAL | Status: DC
  Administered 2020-09-03 (×2): 150 mg via ORAL
  Filled 2020-09-02 (×3): qty 3

## 2020-09-02 MED ORDER — MORPHINE SULFATE (PF) 2 MG/ML IV SOLN: 2.0000 mg | INTRAVENOUS | Status: DC | PRN

## 2020-09-02 MED ORDER — IOHEXOL 350 MG/ML SOLN
50.0000 mL | Freq: Once | INTRAVENOUS | Status: AC | PRN
  Administered 2020-09-02: 50 mL via INTRAVENOUS

## 2020-09-02 MED ORDER — POLYETHYLENE GLYCOL 3350 17 G PO PACK: 17.0000 g | PACK | Freq: Every day | ORAL | Status: DC | PRN

## 2020-09-02 MED ORDER — IRBESARTAN 300 MG PO TABS
300.0000 mg | ORAL_TABLET | Freq: Every day | ORAL | Status: DC
  Administered 2020-09-03: 300 mg via ORAL
  Filled 2020-09-02: qty 1

## 2020-09-02 MED ORDER — ONDANSETRON HCL 4 MG/2ML IJ SOLN: 4.0000 mg | Freq: Four times a day (QID) | INTRAMUSCULAR | Status: DC | PRN

## 2020-09-02 MED ORDER — METOPROLOL SUCCINATE ER 25 MG PO TB24
25.0000 mg | ORAL_TABLET | Freq: Every day | ORAL | Status: DC
  Filled 2020-09-02: qty 1

## 2020-09-02 MED ORDER — PANTOPRAZOLE SODIUM 40 MG PO TBEC
40.0000 mg | DELAYED_RELEASE_TABLET | Freq: Two times a day (BID) | ORAL | Status: DC
  Administered 2020-09-03 (×2): 40 mg via ORAL
  Filled 2020-09-02 (×2): qty 1

## 2020-09-02 MED ORDER — ATORVASTATIN CALCIUM 10 MG PO TABS
10.0000 mg | ORAL_TABLET | Freq: Every day | ORAL | Status: DC
  Administered 2020-09-03: 10 mg via ORAL
  Filled 2020-09-02: qty 1

## 2020-09-02 MED ORDER — APIXABAN 5 MG PO TABS: 5.0000 mg | ORAL_TABLET | Freq: Two times a day (BID) | ORAL | Status: DC

## 2020-09-02 MED ORDER — TRAZODONE HCL 100 MG PO TABS
100.0000 mg | ORAL_TABLET | Freq: Every day | ORAL | Status: DC
  Administered 2020-09-03: 100 mg via ORAL
  Filled 2020-09-02: qty 2

## 2020-09-02 NOTE — ED Notes (Signed)
Patient denies symptoms of chest pain or sob currently.  Reports having chest pain, left arm pain last night.  Patient reports sob and falling last night while trying to walk to bathroom.  Denies issues now.  Spoke to dr hagler .  Patient going to ED

## 2020-09-02 NOTE — ED Provider Notes (Signed)
[facility] Provider Note   CSN: [id number] Arrival date & time: 09/02/20  1030     History Chief Complaint  Patient presents with  . Chest Pain    Samuel Jacobs is a 70 y.o. male.  The history is provided by the patient.  Chest Pain Pain location:  R chest and substernal area Pain quality: aching and pressure   Pain radiates to:  R arm Pain severity:  Mild Onset quality:  Gradual Timing:  Intermittent Progression:  Waxing and waning Chronicity:  New Context: at rest   Relieved by:  Nothing Worsened by:  Nothing Associated symptoms: shortness of breath and syncope   Associated symptoms: no abdominal pain, no back pain, no cough, no dizziness, no fever, no headache, no nausea, no numbness, no palpitations, no vomiting and no weakness   Risk factors: high cholesterol and hypertension        Past Medical History:  Diagnosis Date  . Allergic rhinitis   . Allergy   . Anxiety   . Arthritis   . Carpal tunnel syndrome    09-12-15 no longer a problem.  . Colon polyps   . Complication of anesthesia    one time awaken- not with last procedure done.  . Depression   . ED (erectile dysfunction)   . GERD (gastroesophageal reflux disease)   . HLD (hyperlipidemia)   . Hypertension   . Insomnia   . Seizure ([place])    08-13-15 per pt his last seizure was "about 1 year ago".  maw  . Sleep apnea    pt does not wear c-pap    Patient Active Problem List   Diagnosis Date Noted  . Hilar lymphadenopathy 09/15/2018  . TB lung, latent 08/18/2018  . Hemoptysis 07/20/2018  . Dyspnea 07/20/2018  . Insomnia 08/21/2015  . Hallux rigidus of both feet 05/29/2015  . 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 Encounter for long-term (current) use of other medications 04/03/2013    Past Surgical History:  Procedure Laterality Date  . CHOLECYSTECTOMY  11/07/2015  . COLONOSCOPY    . EUS N/A 09/18/2015    Procedure: UPPER ENDOSCOPIC ULTRASOUND (EUS) LINEAR;  Surgeon: Milus Banister, MD;  Location: [facility] ENDOSCOPY;  Service: Endoscopy;  Laterality: N/A;  . TONSILLECTOMY    . 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  Marland Kitchen UPPER GASTROINTESTINAL ENDOSCOPY    . VIDEO BRONCHOSCOPY Bilateral 07/27/2018   Procedure: VIDEO BRONCHOSCOPY WITHOUT FLUORO;  Surgeon: Collene Gobble, MD;  Location: [facility] ENDOSCOPY;  Service: Cardiopulmonary;  Laterality: Bilateral;  . WISDOM TOOTH EXTRACTION         Family History  Problem Relation Age of Onset  . Breast cancer Mother        mets to liver  . Stomach cancer Father   . Epilepsy Sister   . Cancer Maternal Grandmother        type unknown  . Cancer Maternal Grandfather        type unknown  . Cancer Paternal Grandfather        type unknown  . Cancer Paternal Grandmother        type unknown  . Colon cancer Neg Hx   . Esophageal cancer Neg Hx   . Rectal cancer Neg Hx   . Prostate cancer Neg Hx     Social History   Tobacco Use  . Smoking status: Never Smoker  . Smokeless tobacco: Never Used  Substance Use Topics  . Alcohol use: No    Alcohol/week: 0.0 standard drinks    Comment: past -none in 7-8 yrs social only  . Drug use: No    Home Medications Prior to Admission medications   Medication Sig Start Date End Date Taking? Authorizing Provider  VIMPAT 150 MG TABS TAKE 1 TABLET BY MOUTH TWICE A DAY 08/05/20   Marcial Pacas, MD  albuterol (PROVENTIL HFA;VENTOLIN HFA) 108 (90 Base) MCG/ACT inhaler Inhale 2 puffs into the lungs every 4 (four) hours as needed for wheezing or shortness of breath. 08/18/18   Collene Gobble, MD  atorvastatin (LIPITOR) 10 MG tablet TAKE 1 TABLET BY MOUTH EVERY DAY 05/26/20   Adrian Prows, MD  fluticasone [facility]) 50 MCG/ACT nasal spray Place 1 spray into both nostrils daily as needed for allergies. Reported on 08/13/2015 11/26/14   [provider]  hydrochlorothiazide (HYDRODIURIL) 12.5 MG tablet  Take 12.5 mg by mouth daily. 08/22/18   [provider]  HYDROcodone-acetaminophen (NORCO/VICODIN) 5-325 MG tablet Take 1 tablet by mouth every 8 (eight) hours as needed for moderate pain.  07/18/15   [provider]  irbesartan (AVAPRO) 300 MG tablet TAKE 1 TABLET BY MOUTH DAILY *INCREASED DOSE* 11/21/17   [provider]  meloxicam (MOBIC) 15 MG tablet TAKE 1 TABLET BY MOUTH EVERY DAY 12/09/16   Trula Slade, DPM  metoprolol succinate (TOPROL-XL) 25 MG 24 hr tablet Take 25 mg by mouth daily.    [provider]  pantoprazole (PROTONIX) 40 MG tablet Take 40 mg by mouth 2 (two) times daily. 02/21/18   [provider]  rifampin (RIFADIN) 300 MG capsule Take 2 capsules (600 mg total) by mouth daily. Patient not taking: Reported on 11/08/2019 08/18/18   Collene Gobble, MD  traZODone (DESYREL) 100 MG tablet Take one tablet daily at bedtime. Please call [phone] to schedule an appt or request refills from PCP. 07/02/19   Marcial Pacas, MD    Allergies    Elemental sulfur  Review of Systems   Review of Systems  Constitutional: Negative for chills and fever.  HENT: Negative for ear pain and sore throat.   Eyes: Negative for pain and visual disturbance.  Respiratory: Positive for shortness of breath. Negative for cough.   Cardiovascular: Positive for chest pain and syncope. Negative for palpitations.  Gastrointestinal: Negative for abdominal pain, nausea and vomiting.  Genitourinary: Negative for dysuria and hematuria.  Musculoskeletal: Negative for arthralgias and back pain.  Skin: Positive for wound. Negative for color change and rash.  Neurological: Positive for syncope. Negative for dizziness, tremors, seizures, facial asymmetry, speech difficulty, weakness, light-headedness, numbness and headaches.  All other systems reviewed and are negative.   Physical Exam Updated Vital Signs  ED Triage Vitals  Enc Vitals Group     BP 09/02/20 1038 (!) 127/47      Pulse Rate 09/02/20 1038 61     Resp 09/02/20 1038 18     Temp 09/02/20 1038 98.3 F (36.8 C)     Temp Source 09/02/20 1038 Oral     SpO2 09/02/20 1038 95 %     Weight --      Height --      Head Circumference --      Peak Flow --      Pain Score 09/02/20 1049 0     Pain Loc --      Pain Edu? --      Excl. in [place]? --  Physical Exam Vitals and nursing note reviewed.  Constitutional:      General: He is not in acute distress.    Appearance: He is well-developed and well-nourished. He is not ill-appearing.  HENT:     Head: Normocephalic and atraumatic.     Comments: 1-2cm superficial laceration to the bridge of nose, hemostatic Eyes:     Extraocular Movements: Extraocular movements intact.     Conjunctiva/sclera: Conjunctivae normal.     Pupils: Pupils are equal, round, and reactive to light.  Cardiovascular:     Rate and Rhythm: Normal rate and regular rhythm.     Pulses:          Radial pulses are 2+ on the right side and 2+ on the left side.     Heart sounds: Normal heart sounds. No murmur heard.   Pulmonary:     Effort: Pulmonary effort is normal. No respiratory distress.     Breath sounds: Normal breath sounds. No decreased breath sounds, wheezing or rhonchi.  Abdominal:     Palpations: Abdomen is soft.     Tenderness: There is no abdominal tenderness.  Musculoskeletal:        General: No edema. Normal range of motion.     Cervical back: Normal range of motion and neck supple.     Right lower leg: No edema.     Left lower leg: No edema.  Skin:    General: Skin is warm and dry.     Capillary Refill: Capillary refill takes less than 2 seconds.  Neurological:     General: No focal deficit present.     Mental Status: He is alert and oriented to person, place, and time.     Cranial Nerves: No cranial nerve deficit.     Motor: No weakness.     Comments: 5+ out of 5 strength throughout, normal sensation, normal finger-nose-finger, normal speech  Psychiatric:         Mood and Affect: Mood and affect normal.     ED Results / Procedures / Treatments   Labs (all labs ordered are listed, but only abnormal results are displayed) Labs Reviewed  BASIC METABOLIC PANEL - Abnormal; Notable for the following components:      Result Value   Glucose, Bld 177 (*)    All other components within normal limits  CBC - Abnormal; Notable for the following components:   WBC 10.6 (*)    All other components within normal limits  RESP PANEL BY RT-PCR (FLU A&B, COVID) [id number]  URINALYSIS, ROUTINE W REFLEX MICROSCOPIC  TROPONIN I (HIGH SENSITIVITY)  TROPONIN I (HIGH SENSITIVITY)    EKG EKG Interpretation  Date/Time:  Tuesday September 02 2020 10:37:07 EST Ventricular Rate:  61 PR Interval:  142 QRS Duration: 136 QT Interval:  440 QTC Calculation: 442 R Axis:   -43 Text Interpretation: Normal sinus rhythm Left axis deviation Right bundle branch block Abnormal ECG No significant change since last tracing Confirmed by Lennice Sites [phone]) on 09/02/2020 12:23:55 PM   Radiology DG Chest 2 View  Result Date: 09/02/2020 CLINICAL DATA:  Chest pain.  Shortness of breath. EXAM: CHEST - 2 VIEW COMPARISON:  08/16/2018.  04/15/2015. FINDINGS: Mediastinum and hilar structures normal. Heart size normal. No focal infiltrate. Stable mild left base pleural thickening consistent scarring. IMPRESSION: No active cardiopulmonary disease. Electronically Signed   By: Marcello Moores  Register   On: 09/02/2020 11:03   CT Head Wo Contrast  Addendum Date: 09/02/2020   ADDENDUM REPORT: 09/02/2020 14:42  ADDENDUM: These results were called by telephone at the time of interpretation on 09/02/2020 at 2:41 pm to provider Brielyn Bosak , who verbally acknowledged these results. Electronically Signed   By: Zerita Boers M.D.   On: 09/02/2020 14:42   Result Date: 09/02/2020 CLINICAL DATA:  Shortness of breath and a fall from standing last night with head and neck pain. EXAM: CT HEAD WITHOUT CONTRAST  CT MAXILLOFACIAL WITHOUT CONTRAST CT CERVICAL SPINE WITHOUT CONTRAST TECHNIQUE: Multidetector CT imaging of the head, cervical spine, and maxillofacial structures were performed using the standard protocol without intravenous contrast. Multiplanar CT image reconstructions of the cervical spine and maxillofacial structures were also generated. COMPARISON:  CT head dated 10/16/2011. FINDINGS: CT HEAD FINDINGS Brain: No evidence of acute infarction, hemorrhage, hydrocephalus, extra-axial collection or mass lesion/mass effect. A 4 mm cyst anterior and superior to the anterior horn of the right lateral ventricle. This is likely benign and may represent a perivascular space or a chronic lacunar infarct. Vascular: There are vascular calcifications in the carotid siphons. Skull: Normal. Negative for fracture or focal lesion. Other: None. CT MAXILLOFACIAL FINDINGS Osseous: There is an acute left orbital blowout fracture involving the inferior orbital wall. The fracture extends to the infraorbital canal. There is mild associated soft tissue swelling and gas. There is no evidence of herniation of the inferior rectus muscle. Orbits: No intraconal gas or hematoma on either side. The globes appear normal. Sinuses: Minimal fluid is seen in the left maxillary sinus, likely representing blood products. Soft tissues: There is soft tissue swelling overlying the nose and periorbital region with soft tissue gas near the bridge of the nose, likely reflecting a skin laceration. CT CERVICAL SPINE FINDINGS Alignment: Normal. Skull base and vertebrae: No acute fracture. No primary bone lesion or focal pathologic process. Soft tissues and spinal canal: No prevertebral fluid or swelling. No visible canal hematoma. Disc levels: Up to moderate to severe multilevel degenerative disc and joint disease. Degenerative changes at the level of C4-C6 result in moderate narrowing of the central canal. Upper chest: Negative. Other: A 1.5 cm nodule is seen  in the left thyroid. IMPRESSION: 1. Acute left orbital blowout fracture involving the inferior orbital wall. The fracture extends to the infraorbital canal. No evidence of herniation of the inferior rectus muscle. 2. No acute intracranial process. 3. No acute osseous injury in the cervical spine. 4. A 1.5 cm nodule is seen in the left thyroid lobe. Recommend thyroid US on a nonemergent basis for further evaluation. (Ref: J Am Coll Radiol. 2015 Feb;12(2): 143-50). Electronically Signed: By: Zerita Boers M.D. On: 09/02/2020 14:23   CT Angio Chest PE W and/or Wo Contrast  Result Date: 09/02/2020 CLINICAL DATA:  Chest pain, shortness of breath. EXAM: CT ANGIOGRAPHY CHEST WITH CONTRAST TECHNIQUE: Multidetector CT imaging of the chest was performed using the standard protocol during bolus administration of intravenous contrast. Multiplanar CT image reconstructions and MIPs were obtained to evaluate the vascular anatomy. CONTRAST:  92mL OMNIPAQUE IOHEXOL 350 MG/ML SOLN COMPARISON:  Chest radiograph performed the same day and chest CT dated 08/16/2018. FINDINGS: Cardiovascular: Satisfactory opacification of the pulmonary arteries to the segmental level. A filling defect in a subsegmental pulmonary artery supplying the right lower lobe is consistent with a pulmonary embolism (series 5, image 296). The right and left pulmonary arteries are enlarged, measuring 2.7 cm and 2.4 cm respectively, suggestive of pulmonary hypertension. The ascending aorta measures 4.0 cm in diameter. Vascular calcifications are seen in the aortic arch. Normal heart  size. No pericardial effusion. Mediastinum/Nodes: No enlarged mediastinal, hilar, or axillary lymph nodes. A hypoattenuating left thyroid nodule measures 1.5 cm. The trachea and esophagus demonstrate no significant findings. Lungs/Pleura: There is mild bibasilar atelectasis. No pleural effusion or pneumothorax. Upper Abdomen: No acute abnormality. Musculoskeletal: Degenerative changes  are seen in the spine. Review of the MIP images confirms the above findings. IMPRESSION: 1. Pulmonary embolism in a subsegmental pulmonary artery supplying the right lower lobe. 2. Enlarged right and left pulmonary arteries, suggestive of pulmonary hypertension. 3. A 1.5 cm left thyroid nodule is noted. Non emergent thyroid ultrasound is recommended. (Ref: J Am Coll Radiol. 2015 Feb;12(2): 143-50). Aortic Atherosclerosis (ICD10-I70.0). These results were called by telephone at the time of interpretation on 09/02/2020 at 2:41 pm to provider Taiyana Kissler , who verbally acknowledged these results. Electronically Signed   By: Zerita Boers M.D.   On: 09/02/2020 14:39   CT Cervical Spine Wo Contrast  Addendum Date: 09/02/2020   ADDENDUM REPORT: 09/02/2020 14:42 ADDENDUM: These results were called by telephone at the time of interpretation on 09/02/2020 at 2:41 pm to provider Josslin Sanjuan , who verbally acknowledged these results. Electronically Signed   By: Zerita Boers M.D.   On: 09/02/2020 14:42   Result Date: 09/02/2020 CLINICAL DATA:  Shortness of breath and a fall from standing last night with head and neck pain. EXAM: CT HEAD WITHOUT CONTRAST CT MAXILLOFACIAL WITHOUT CONTRAST CT CERVICAL SPINE WITHOUT CONTRAST TECHNIQUE: Multidetector CT imaging of the head, cervical spine, and maxillofacial structures were performed using the standard protocol without intravenous contrast. Multiplanar CT image reconstructions of the cervical spine and maxillofacial structures were also generated. COMPARISON:  CT head dated 10/16/2011. FINDINGS: CT HEAD FINDINGS Brain: No evidence of acute infarction, hemorrhage, hydrocephalus, extra-axial collection or mass lesion/mass effect. A 4 mm cyst anterior and superior to the anterior horn of the right lateral ventricle. This is likely benign and may represent a perivascular space or a chronic lacunar infarct. Vascular: There are vascular calcifications in the carotid siphons.  Skull: Normal. Negative for fracture or focal lesion. Other: None. CT MAXILLOFACIAL FINDINGS Osseous: There is an acute left orbital blowout fracture involving the inferior orbital wall. The fracture extends to the infraorbital canal. There is mild associated soft tissue swelling and gas. There is no evidence of herniation of the inferior rectus muscle. Orbits: No intraconal gas or hematoma on either side. The globes appear normal. Sinuses: Minimal fluid is seen in the left maxillary sinus, likely representing blood products. Soft tissues: There is soft tissue swelling overlying the nose and periorbital region with soft tissue gas near the bridge of the nose, likely reflecting a skin laceration. CT CERVICAL SPINE FINDINGS Alignment: Normal. Skull base and vertebrae: No acute fracture. No primary bone lesion or focal pathologic process. Soft tissues and spinal canal: No prevertebral fluid or swelling. No visible canal hematoma. Disc levels: Up to moderate to severe multilevel degenerative disc and joint disease. Degenerative changes at the level of C4-C6 result in moderate narrowing of the central canal. Upper chest: Negative. Other: A 1.5 cm nodule is seen in the left thyroid. IMPRESSION: 1. Acute left orbital blowout fracture involving the inferior orbital wall. The fracture extends to the infraorbital canal. No evidence of herniation of the inferior rectus muscle. 2. No acute intracranial process. 3. No acute osseous injury in the cervical spine. 4. A 1.5 cm nodule is seen in the left thyroid lobe. Recommend thyroid US on a nonemergent basis for further evaluation. (Ref:  J Am Coll Radiol. 2015 Feb;12(2): 143-50). Electronically Signed: By: Zerita Boers M.D. On: 09/02/2020 14:23   CT Maxillofacial Wo Contrast  Addendum Date: 09/02/2020   ADDENDUM REPORT: 09/02/2020 14:42 ADDENDUM: These results were called by telephone at the time of interpretation on 09/02/2020 at 2:41 pm to provider Yuri Fana , who  verbally acknowledged these results. Electronically Signed   By: Zerita Boers M.D.   On: 09/02/2020 14:42   Result Date: 09/02/2020 CLINICAL DATA:  Shortness of breath and a fall from standing last night with head and neck pain. EXAM: CT HEAD WITHOUT CONTRAST CT MAXILLOFACIAL WITHOUT CONTRAST CT CERVICAL SPINE WITHOUT CONTRAST TECHNIQUE: Multidetector CT imaging of the head, cervical spine, and maxillofacial structures were performed using the standard protocol without intravenous contrast. Multiplanar CT image reconstructions of the cervical spine and maxillofacial structures were also generated. COMPARISON:  CT head dated 10/16/2011. FINDINGS: CT HEAD FINDINGS Brain: No evidence of acute infarction, hemorrhage, hydrocephalus, extra-axial collection or mass lesion/mass effect. A 4 mm cyst anterior and superior to the anterior horn of the right lateral ventricle. This is likely benign and may represent a perivascular space or a chronic lacunar infarct. Vascular: There are vascular calcifications in the carotid siphons. Skull: Normal. Negative for fracture or focal lesion. Other: None. CT MAXILLOFACIAL FINDINGS Osseous: There is an acute left orbital blowout fracture involving the inferior orbital wall. The fracture extends to the infraorbital canal. There is mild associated soft tissue swelling and gas. There is no evidence of herniation of the inferior rectus muscle. Orbits: No intraconal gas or hematoma on either side. The globes appear normal. Sinuses: Minimal fluid is seen in the left maxillary sinus, likely representing blood products. Soft tissues: There is soft tissue swelling overlying the nose and periorbital region with soft tissue gas near the bridge of the nose, likely reflecting a skin laceration. CT CERVICAL SPINE FINDINGS Alignment: Normal. Skull base and vertebrae: No acute fracture. No primary bone lesion or focal pathologic process. Soft tissues and spinal canal: No prevertebral fluid or  swelling. No visible canal hematoma. Disc levels: Up to moderate to severe multilevel degenerative disc and joint disease. Degenerative changes at the level of C4-C6 result in moderate narrowing of the central canal. Upper chest: Negative. Other: A 1.5 cm nodule is seen in the left thyroid. IMPRESSION: 1. Acute left orbital blowout fracture involving the inferior orbital wall. The fracture extends to the infraorbital canal. No evidence of herniation of the inferior rectus muscle. 2. No acute intracranial process. 3. No acute osseous injury in the cervical spine. 4. A 1.5 cm nodule is seen in the left thyroid lobe. Recommend thyroid US on a nonemergent basis for further evaluation. (Ref: J Am Coll Radiol. 2015 Feb;12(2): 143-50). Electronically Signed: By: Zerita Boers M.D. On: 09/02/2020 14:23    Procedures .Marland KitchenLaceration Repair  Date/Time: 09/02/2020 12:53 PM Performed by: Lennice Sites, DO Authorized by: Lennice Sites, DO   Consent:    Consent obtained:  Verbal   Consent given by:  Patient   Risks, benefits, and alternatives were discussed: yes     Risks discussed:  Infection, need for additional repair, nerve damage, pain, poor cosmetic result, poor wound healing, retained foreign body, tendon damage and vascular damage   Alternatives discussed:  No treatment Universal protocol:    Procedure explained and questions answered to patient or proxy's satisfaction: yes     Relevant documents present and verified: yes     Patient identity confirmed:  Verbally with patient  Anesthesia:    Anesthesia method:  None Laceration details:    Location: nose.   Length (cm):  2   Depth (mm):  1 Pre-procedure details:    Preparation:  Patient was prepped and draped in usual sterile fashion Exploration:    Limited defect created (wound extended): no     Wound exploration: wound explored through full range of motion and entire depth of wound visualized     Wound extent: no areolar tissue violation  noted, no fascia violation noted, no foreign bodies/material noted, no muscle damage noted, no nerve damage noted, no tendon damage noted, no underlying fracture noted and no vascular damage noted     Contaminated: no   Treatment:    Area cleansed with:  Saline and Shur-Clens   Debridement:  None   Undermining:  None Skin repair:    Repair method:  Steri-Strips and tissue adhesive   Number of Steri-Strips:  4 Approximation:    Approximation:  Close Repair type:    Repair type:  Simple Post-procedure details:    Dressing:  Open (no dressing)   Procedure completion:  Tolerated well, no immediate complications     Medications Ordered in ED Medications  apixaban (ELIQUIS) tablet 10 mg (has no administration in time range)    Followed by  apixaban (ELIQUIS) tablet 5 mg (has no administration in time range)  sodium chloride 0.9 % bolus 1,000 mL (0 mLs Intravenous Stopped 09/02/20 1427)  iohexol (OMNIPAQUE) 350 MG/ML injection 50 mL (50 mLs Intravenous Contrast Given 09/02/20 1339)    ED Course  I have reviewed the triage vital signs and the nursing notes.  Pertinent labs & imaging results that were available during my care of the patient were reviewed by me and considered in my medical decision making (see chart for details).    MDM Rules/Calculators/A&P                          Samuel Jacobs is a 69 year old male with history of high cholesterol, hypertension who presents the ED after syncope event associated with chest pain.  Patient with slightly elevated blood pressure but otherwise unremarkable vitals.  EKG shows sinus rhythm.  No obvious ischemic changes.  Patient states that last night he was having some chest pain.  He went to sleep and when he woke up he got up and started to walk to the bathroom and had some chest pain/not feeling well and he passed out on the floor hit his head.  He went back to bed afterwards and woke up this morning continued to have some chest pain with  radiation to his right arm and some shortness of breath.  He has a laceration to the bridge of his nose that was repaired with Dermabond.  Overall now he is feeling that the chest pain is improved.  He still has some shortness of breath.  Denies Covid symptoms.  Has not been vaccinated.  He states that he has not felt well in the last several months.  Overall fairly concerning story for syncope.  Could have been a vasovagal event but given associated chest pain will evaluate with PE study, troponins.  We will get a CT scan of his head and face given the trauma.  Will check basic labs otherwise.  Anticipate likely admission for telemetry and echocardiogram.  Patient found to have a left orbital wall fracture.  Talked with Dr. Marcelline Deist with ENT.  Patient can follow-up outpatient in  5 to 7 days.  Patient has no sign of entrapment, no diplopia.  Overall mild fracture.  Patient also found to have a small acute PE in the right subsegmental artery.  Troponin negative x2.  No significant anemia, electrolyte abnormality, kidney injury.  Overall patient with syncopal event and now found to have a PE.  Not sure if there is still a cardiac process or arrhythmia process.  PE seems to possibly be incidental given small clot burden.  Will discuss with medicine about admission for further care.  We will admit to medicine.  We will start Eliquis.  Patient follow-up with ENT outpatient.  This chart was dictated using voice recognition software.  Despite best efforts to proofread,  errors can occur which can change the documentation meaning.    Final Clinical Impression(s) / ED Diagnoses Final diagnoses:  Other acute pulmonary embolism, unspecified whether acute cor pulmonale present ([place])  Closed fracture of orbital wall, initial encounter ([place])  Syncope, unspecified syncope type  Facial laceration, initial encounter    Rx / DC Orders ED Discharge Orders    None       Lennice Sites, DO 09/02/20 1517

## 2020-09-02 NOTE — H&P (Signed)
History and Physical    Samuel Jacobs PJA:[id number] DOB: 1950/08/01 DOA: 09/02/2020  PCP: Prince Solian, MD  Patient coming from: Home   Chief Complaint:  Chief Complaint  Patient presents with  . 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  Patient explains that approximately 4 AM this morning he woke up from sleep with sudden onset right arm pain.  Patient describes his arm pain as sharp in quality, 6 out of 10 in intensity, radiating towards his chest.  This was associated with sudden onset of shortness of breath.  Patient attempted to get up out of bed and ambulate towards the bathroom but shortly after standing he felt intense lightheadedness and lost consciousness, striking his head on the nightstand as he fell.  Patient awoke shortly after.  With the wife stating that the patient exhibited no evidence of seizure activity.  Patient denies any tongue biting self urination or self defecation.  Patient exhibited no evidence of significant lethargy afterwards.   Patient then presented to [facility] emergency department for evaluation.  Upon evaluation in the emergency department troponins were performed and were found to be 8 and 6.  Due to facial trauma the patient's episode of loss of consciousness a CT trauma survey was performed revealing a acute left orbital blowout fracture involving the inferior orbital wall.  Department provider discussed this with Dr. Marcelline Deist with ENT who reviewed the images and recommended outpatient follow-up in 5 days with [facility].  CT angiogram of the chest revealed a  right lower lobe pulmonary embolism without evidence of right heart strain.  Patient was initiated on Eliquis and the hospitalist group was then called to assess patient for mission to the hospital.   Of note, the patient reports an extremely long car ride to [place] several weeks ago for a funeral.  Patient denies any unilateral leg pain or swelling as of late.  Patient denies any recent unintentional weight loss or night sweats but does complain of approximately 1 year history of ongoing fatigue.  Patient also endorses a several year history of daily hemoptysis, occurring 1 or 2 times daily.  Patient is already undergone an extensive work-up by pulmonology and ENT in the past and no cause has been identified.  Patient reports no change in the symptoms since they started.  Review of Systems:   Review of Systems  Constitutional: Positive for malaise/fatigue.  Respiratory: Positive for hemoptysis and shortness of breath.   Musculoskeletal: Positive for falls.  Neurological: Positive for loss of consciousness, weakness and headaches.    Past Medical History:  Diagnosis Date  . Allergic rhinitis   . Allergy   . Anxiety   . Arthritis   . Carpal tunnel syndrome    09-12-15 no longer a problem.  . Colon polyps   . Complication of anesthesia    one time awaken- not with last procedure done.  . Depression   . ED (erectile dysfunction)   . GERD (gastroesophageal reflux disease)   . HLD (hyperlipidemia)   . Hypertension   . Insomnia   . Seizure ([place])    08-13-15 per pt his last seizure was "about 1  year ago".  maw  . Sleep apnea    pt does not wear c-pap    Past Surgical History:  Procedure Laterality Date  . CHOLECYSTECTOMY  11/07/2015  . COLONOSCOPY    . EUS N/A 09/18/2015   Procedure: UPPER ENDOSCOPIC ULTRASOUND (EUS) LINEAR;  Surgeon: Milus Banister, MD;  Location: [facility] ENDOSCOPY;  Service: Endoscopy;  Laterality: N/A;  . TONSILLECTOMY    . TUMOR REMOVAL  11/07/2015   Cancerous tumor  removed from duodenum.  Marland Kitchen UPPER GASTROINTESTINAL ENDOSCOPY    . VIDEO BRONCHOSCOPY Bilateral 07/27/2018   Procedure: VIDEO BRONCHOSCOPY WITHOUT FLUORO;  Surgeon: Collene Gobble, MD;  Location: [facility] ENDOSCOPY;  Service: Cardiopulmonary;  Laterality: Bilateral;  . WISDOM TOOTH EXTRACTION       reports that he has never smoked. He has never used smokeless tobacco. He reports that he does not drink alcohol and does not use drugs.  Allergies  Allergen Reactions  . Elemental Sulfur Nausea Only    Family History  Problem Relation Age of Onset  . Breast cancer Mother        mets to liver  . Stomach cancer Father   . Epilepsy Sister   . Cancer Maternal Grandmother        type unknown  . Cancer Maternal Grandfather        type unknown  . Cancer Paternal Grandfather        type unknown  . Cancer Paternal Grandmother        type unknown  . Colon cancer Neg Hx   . Esophageal cancer Neg Hx   . Rectal cancer Neg Hx   . Prostate cancer Neg Hx      Prior to Admission medications   Medication Sig Start Date End Date Taking? Authorizing Provider  atorvastatin (LIPITOR) 10 MG tablet TAKE 1 TABLET BY MOUTH EVERY DAY Patient taking differently: Take 10 mg by mouth daily. 05/26/20  Yes Adrian Prows, MD  hydrochlorothiazide (HYDRODIURIL) 12.5 MG tablet Take 12.5 mg by mouth daily. 08/22/18  Yes [provider]  HYDROcodone-acetaminophen (NORCO/VICODIN) 5-325 MG tablet Take 1 tablet by mouth daily. 07/18/15  Yes [provider]  ibuprofen (ADVIL) 200 MG tablet Take 800 mg by mouth every 6 (six) hours as needed for moderate pain.   Yes [provider]  irbesartan (AVAPRO) 300 MG tablet Take 300 mg by mouth daily. 11/21/17  Yes [provider]  loratadine (CLARITIN) 10 MG tablet Take 10 mg by mouth daily. 07/14/20  Yes [provider]  meloxicam (MOBIC) 15 MG tablet TAKE 1 TABLET BY MOUTH EVERY DAY Patient taking differently: Take 15 mg by mouth daily. 12/09/16   Yes Trula Slade, DPM  metoprolol succinate (TOPROL-XL) 25 MG 24 hr tablet Take 25 mg by mouth daily.   Yes [provider]  pantoprazole (PROTONIX) 40 MG tablet Take 40 mg by mouth 2 (two) times daily. 02/21/18  Yes [provider]  traZODone (DESYREL) 100 MG tablet Take one tablet daily at bedtime. Please call [phone] to schedule an appt or request refills from PCP. Patient taking differently: Take 100 mg by mouth at bedtime. 07/02/19  Yes Marcial Pacas, MD  VIMPAT 150 MG TABS TAKE 1 TABLET BY MOUTH TWICE A DAY Patient taking differently: Take 150 mg by mouth 2 (two) times daily. 08/05/20  Yes Marcial Pacas, MD  albuterol (PROVENTIL HFA;VENTOLIN HFA) 108 (90 Base) MCG/ACT inhaler Inhale 2 puffs into the lungs every 4 (four) hours as needed for wheezing  or shortness of breath. Patient not taking: No sig reported 08/18/18   Collene Gobble, MD  rifampin (RIFADIN) 300 MG capsule Take 2 capsules (600 mg total) by mouth daily. Patient not taking: No sig reported 08/18/18   Collene Gobble, MD    Physical Exam: Vitals:   09/02/20 1600 09/02/20 1630 09/02/20 1700 09/02/20 1730  BP: (!) 148/89 132/75 131/73 115/66  Pulse: (!) 59 (!) 56 64 70  Resp: 19 16 18 16   Temp:      TempSrc:      SpO2: 98% 98% 92% 95%  Weight:      Height:        Constitutional: Acute alert and oriented x3, no associated distress.   Skin: no rashes, no lesions, good skin turgor noted. Eyes: Pupils are equally reactive to light.  No evidence of scleral icterus or conjunctival pallor.  ENMT: Moist mucous membranes noted.  Posterior pharynx clear of any exudate or lesions.   Neck: normal, supple, no masses, no thyromegaly.  No evidence of jugular venous distension.   Respiratory: clear to auscultation bilaterally, no wheezing, no crackles. Normal respiratory effort. No accessory muscle use.  Cardiovascular: Regular rate and rhythm, no murmurs / rubs / gallops. No extremity edema. 2+ pedal pulses. No carotid  bruits.  Chest:   Nontender without crepitus or deformity.   Back:   Nontender without crepitus or deformity. Abdomen: Abdomen is soft and nontender.  No evidence of intra-abdominal masses.  Positive bowel sounds noted in all quadrants.   Musculoskeletal: No joint deformity upper and lower extremities. Good ROM, no contractures. Normal muscle tone.  Neurologic: CN 2-12 grossly intact. Sensation intact.  Patient moving all 4 extremities spontaneously.  Patient is following all commands.  Patient is responsive to verbal stimuli.   Psychiatric: Patient exhibits normal mood with appropriate affect.  Patient seems to possess insight as to their current situation.     Labs on Admission: I have personally reviewed following labs and imaging studies -   CBC: Recent Labs  Lab 09/02/20 1116  WBC 10.6*  HGB 15.4  HCT 44.0  MCV 89.1  PLT 412   Basic Metabolic Panel: Recent Labs  Lab 09/02/20 1116  NA 136  K 4.5  CL 100  CO2 27  GLUCOSE 177*  BUN 22  CREATININE 1.17  CALCIUM 9.4   GFR: Estimated Creatinine Clearance: 76.7 mL/min (by C-G formula based on SCr of 1.17 mg/dL). Liver Function Tests: No results for input(s): AST, ALT, ALKPHOS, BILITOT, PROT, ALBUMIN in the last 168 hours. No results for input(s): LIPASE, AMYLASE in the last 168 hours. No results for input(s): AMMONIA in the last 168 hours. Coagulation Profile: No results for input(s): INR, PROTIME in the last 168 hours. Cardiac Enzymes: No results for input(s): CKTOTAL, CKMB, CKMBINDEX, TROPONINI in the last 168 hours. BNP (last 3 results) No results for input(s): PROBNP in the last 8760 hours. HbA1C: No results for input(s): HGBA1C in the last 72 hours. CBG: No results for input(s): GLUCAP in the last 168 hours. Lipid Profile: No results for input(s): CHOL, HDL, LDLCALC, TRIG, CHOLHDL, LDLDIRECT in the last 72 hours. Thyroid Function Tests: No results for input(s): TSH, T4TOTAL, FREET4, T3FREE, THYROIDAB in the  last 72 hours. Anemia Panel: No results for input(s): VITAMINB12, FOLATE, FERRITIN, TIBC, IRON, RETICCTPCT in the last 72 hours. Urine analysis:    Component Value Date/Time   COLORURINE YELLOW 09/02/2020 1302   APPEARANCEUR CLEAR 09/02/2020 1302   LABSPEC 1.015 09/02/2020  1302   PHURINE 6.0 09/02/2020 1302   GLUCOSEU NEGATIVE 09/02/2020 1302   HGBUR NEGATIVE 09/02/2020 1302   BILIRUBINUR NEGATIVE 09/02/2020 1302   KETONESUR NEGATIVE 09/02/2020 1302   PROTEINUR NEGATIVE 09/02/2020 1302   UROBILINOGEN 1.0 04/15/2015 0758   NITRITE NEGATIVE 09/02/2020 1302   LEUKOCYTESUR NEGATIVE 09/02/2020 1302    Radiological Exams on Admission - Personally Reviewed: DG Chest 2 View  Result Date: 09/02/2020 CLINICAL DATA:  Chest pain.  Shortness of breath. EXAM: CHEST - 2 VIEW COMPARISON:  08/16/2018.  04/15/2015. FINDINGS: Mediastinum and hilar structures normal. Heart size normal. No focal infiltrate. Stable mild left base pleural thickening consistent scarring. IMPRESSION: No active cardiopulmonary disease. Electronically Signed   By: Marcello Moores  Register   On: 09/02/2020 11:03   CT Head Wo Contrast  Addendum Date: 09/02/2020   ADDENDUM REPORT: 09/02/2020 14:42 ADDENDUM: These results were called by telephone at the time of interpretation on 09/02/2020 at 2:41 pm to provider ADAM CURATOLO , who verbally acknowledged these results. Electronically Signed   By: Zerita Boers M.D.   On: 09/02/2020 14:42   Result Date: 09/02/2020 CLINICAL DATA:  Shortness of breath and a fall from standing last night with head and neck pain. EXAM: CT HEAD WITHOUT CONTRAST CT MAXILLOFACIAL WITHOUT CONTRAST CT CERVICAL SPINE WITHOUT CONTRAST TECHNIQUE: Multidetector CT imaging of the head, cervical spine, and maxillofacial structures were performed using the standard protocol without intravenous contrast. Multiplanar CT image reconstructions of the cervical spine and maxillofacial structures were also generated. COMPARISON:  CT  head dated 10/16/2011. FINDINGS: CT HEAD FINDINGS Brain: No evidence of acute infarction, hemorrhage, hydrocephalus, extra-axial collection or mass lesion/mass effect. A 4 mm cyst anterior and superior to the anterior horn of the right lateral ventricle. This is likely benign and may represent a perivascular space or a chronic lacunar infarct. Vascular: There are vascular calcifications in the carotid siphons. Skull: Normal. Negative for fracture or focal lesion. Other: None. CT MAXILLOFACIAL FINDINGS Osseous: There is an acute left orbital blowout fracture involving the inferior orbital wall. The fracture extends to the infraorbital canal. There is mild associated soft tissue swelling and gas. There is no evidence of herniation of the inferior rectus muscle. Orbits: No intraconal gas or hematoma on either side. The globes appear normal. Sinuses: Minimal fluid is seen in the left maxillary sinus, likely representing blood products. Soft tissues: There is soft tissue swelling overlying the nose and periorbital region with soft tissue gas near the bridge of the nose, likely reflecting a skin laceration. CT CERVICAL SPINE FINDINGS Alignment: Normal. Skull base and vertebrae: No acute fracture. No primary bone lesion or focal pathologic process. Soft tissues and spinal canal: No prevertebral fluid or swelling. No visible canal hematoma. Disc levels: Up to moderate to severe multilevel degenerative disc and joint disease. Degenerative changes at the level of C4-C6 result in moderate narrowing of the central canal. Upper chest: Negative. Other: A 1.5 cm nodule is seen in the left thyroid. IMPRESSION: 1. Acute left orbital blowout fracture involving the inferior orbital wall. The fracture extends to the infraorbital canal. No evidence of herniation of the inferior rectus muscle. 2. No acute intracranial process. 3. No acute osseous injury in the cervical spine. 4. A 1.5 cm nodule is seen in the left thyroid lobe. Recommend  thyroid US on a nonemergent basis for further evaluation. (Ref: J Am Coll Radiol. 2015 Feb;12(2): 143-50). Electronically Signed: By: Zerita Boers M.D. On: 09/02/2020 14:23   CT Angio Chest PE W  and/or Wo Contrast  Result Date: 09/02/2020 CLINICAL DATA:  Chest pain, shortness of breath. EXAM: CT ANGIOGRAPHY CHEST WITH CONTRAST TECHNIQUE: Multidetector CT imaging of the chest was performed using the standard protocol during bolus administration of intravenous contrast. Multiplanar CT image reconstructions and MIPs were obtained to evaluate the vascular anatomy. CONTRAST:  34mL OMNIPAQUE IOHEXOL 350 MG/ML SOLN COMPARISON:  Chest radiograph performed the same day and chest CT dated 08/16/2018. FINDINGS: Cardiovascular: Satisfactory opacification of the pulmonary arteries to the segmental level. A filling defect in a subsegmental pulmonary artery supplying the right lower lobe is consistent with a pulmonary embolism (series 5, image 296). The right and left pulmonary arteries are enlarged, measuring 2.7 cm and 2.4 cm respectively, suggestive of pulmonary hypertension. The ascending aorta measures 4.0 cm in diameter. Vascular calcifications are seen in the aortic arch. Normal heart size. No pericardial effusion. Mediastinum/Nodes: No enlarged mediastinal, hilar, or axillary lymph nodes. A hypoattenuating left thyroid nodule measures 1.5 cm. The trachea and esophagus demonstrate no significant findings. Lungs/Pleura: There is mild bibasilar atelectasis. No pleural effusion or pneumothorax. Upper Abdomen: No acute abnormality. Musculoskeletal: Degenerative changes are seen in the spine. Review of the MIP images confirms the above findings. IMPRESSION: 1. Pulmonary embolism in a subsegmental pulmonary artery supplying the right lower lobe. 2. Enlarged right and left pulmonary arteries, suggestive of pulmonary hypertension. 3. A 1.5 cm left thyroid nodule is noted. Non emergent thyroid ultrasound is recommended. (Ref: J  Am Coll Radiol. 2015 Feb;12(2): 143-50). Aortic Atherosclerosis (ICD10-I70.0). These results were called by telephone at the time of interpretation on 09/02/2020 at 2:41 pm to provider ADAM CURATOLO , who verbally acknowledged these results. Electronically Signed   By: Zerita Boers M.D.   On: 09/02/2020 14:39   CT Cervical Spine Wo Contrast  Addendum Date: 09/02/2020   ADDENDUM REPORT: 09/02/2020 14:42 ADDENDUM: These results were called by telephone at the time of interpretation on 09/02/2020 at 2:41 pm to provider ADAM CURATOLO , who verbally acknowledged these results. Electronically Signed   By: Zerita Boers M.D.   On: 09/02/2020 14:42   Result Date: 09/02/2020 CLINICAL DATA:  Shortness of breath and a fall from standing last night with head and neck pain. EXAM: CT HEAD WITHOUT CONTRAST CT MAXILLOFACIAL WITHOUT CONTRAST CT CERVICAL SPINE WITHOUT CONTRAST TECHNIQUE: Multidetector CT imaging of the head, cervical spine, and maxillofacial structures were performed using the standard protocol without intravenous contrast. Multiplanar CT image reconstructions of the cervical spine and maxillofacial structures were also generated. COMPARISON:  CT head dated 10/16/2011. FINDINGS: CT HEAD FINDINGS Brain: No evidence of acute infarction, hemorrhage, hydrocephalus, extra-axial collection or mass lesion/mass effect. A 4 mm cyst anterior and superior to the anterior horn of the right lateral ventricle. This is likely benign and may represent a perivascular space or a chronic lacunar infarct. Vascular: There are vascular calcifications in the carotid siphons. Skull: Normal. Negative for fracture or focal lesion. Other: None. CT MAXILLOFACIAL FINDINGS Osseous: There is an acute left orbital blowout fracture involving the inferior orbital wall. The fracture extends to the infraorbital canal. There is mild associated soft tissue swelling and gas. There is no evidence of herniation of the inferior rectus muscle. Orbits: No  intraconal gas or hematoma on either side. The globes appear normal. Sinuses: Minimal fluid is seen in the left maxillary sinus, likely representing blood products. Soft tissues: There is soft tissue swelling overlying the nose and periorbital region with soft tissue gas near the bridge of the nose, likely  reflecting a skin laceration. CT CERVICAL SPINE FINDINGS Alignment: Normal. Skull base and vertebrae: No acute fracture. No primary bone lesion or focal pathologic process. Soft tissues and spinal canal: No prevertebral fluid or swelling. No visible canal hematoma. Disc levels: Up to moderate to severe multilevel degenerative disc and joint disease. Degenerative changes at the level of C4-C6 result in moderate narrowing of the central canal. Upper chest: Negative. Other: A 1.5 cm nodule is seen in the left thyroid. IMPRESSION: 1. Acute left orbital blowout fracture involving the inferior orbital wall. The fracture extends to the infraorbital canal. No evidence of herniation of the inferior rectus muscle. 2. No acute intracranial process. 3. No acute osseous injury in the cervical spine. 4. A 1.5 cm nodule is seen in the left thyroid lobe. Recommend thyroid US on a nonemergent basis for further evaluation. (Ref: J Am Coll Radiol. 2015 Feb;12(2): 143-50). Electronically Signed: By: Zerita Boers M.D. On: 09/02/2020 14:23   CT Maxillofacial Wo Contrast  Addendum Date: 09/02/2020   ADDENDUM REPORT: 09/02/2020 14:42 ADDENDUM: These results were called by telephone at the time of interpretation on 09/02/2020 at 2:41 pm to provider ADAM CURATOLO , who verbally acknowledged these results. Electronically Signed   By: Zerita Boers M.D.   On: 09/02/2020 14:42   Result Date: 09/02/2020 CLINICAL DATA:  Shortness of breath and a fall from standing last night with head and neck pain. EXAM: CT HEAD WITHOUT CONTRAST CT MAXILLOFACIAL WITHOUT CONTRAST CT CERVICAL SPINE WITHOUT CONTRAST TECHNIQUE: Multidetector CT imaging of  the head, cervical spine, and maxillofacial structures were performed using the standard protocol without intravenous contrast. Multiplanar CT image reconstructions of the cervical spine and maxillofacial structures were also generated. COMPARISON:  CT head dated 10/16/2011. FINDINGS: CT HEAD FINDINGS Brain: No evidence of acute infarction, hemorrhage, hydrocephalus, extra-axial collection or mass lesion/mass effect. A 4 mm cyst anterior and superior to the anterior horn of the right lateral ventricle. This is likely benign and may represent a perivascular space or a chronic lacunar infarct. Vascular: There are vascular calcifications in the carotid siphons. Skull: Normal. Negative for fracture or focal lesion. Other: None. CT MAXILLOFACIAL FINDINGS Osseous: There is an acute left orbital blowout fracture involving the inferior orbital wall. The fracture extends to the infraorbital canal. There is mild associated soft tissue swelling and gas. There is no evidence of herniation of the inferior rectus muscle. Orbits: No intraconal gas or hematoma on either side. The globes appear normal. Sinuses: Minimal fluid is seen in the left maxillary sinus, likely representing blood products. Soft tissues: There is soft tissue swelling overlying the nose and periorbital region with soft tissue gas near the bridge of the nose, likely reflecting a skin laceration. CT CERVICAL SPINE FINDINGS Alignment: Normal. Skull base and vertebrae: No acute fracture. No primary bone lesion or focal pathologic process. Soft tissues and spinal canal: No prevertebral fluid or swelling. No visible canal hematoma. Disc levels: Up to moderate to severe multilevel degenerative disc and joint disease. Degenerative changes at the level of C4-C6 result in moderate narrowing of the central canal. Upper chest: Negative. Other: A 1.5 cm nodule is seen in the left thyroid. IMPRESSION: 1. Acute left orbital blowout fracture involving the inferior orbital  wall. The fracture extends to the infraorbital canal. No evidence of herniation of the inferior rectus muscle. 2. No acute intracranial process. 3. No acute osseous injury in the cervical spine. 4. A 1.5 cm nodule is seen in the left thyroid lobe. Recommend thyroid  US on a nonemergent basis for further evaluation. (Ref: J Am Coll Radiol. 2015 Feb;12(2): 143-50). Electronically Signed: By: Zerita Boers M.D. On: 09/02/2020 14:23    EKG: Personally reviewed.  Rhythm is normal sinus rhythm with heart rate of 61 bpm.  Right bundle branch block.   no dynamic ST segment changes appreciated.  Assessment/Plan Principal Problem:   Acute pulmonary embolism without acute cor pulmonale (HCC)   Patient presenting with several hour history of severe right arm and chest pain  Evaluation here revealing patient has a right lower lobe subsegmental pulmonary embolism  Patient reports 8-hour drive to [facility] several weeks ago -it is possible that this long car ride may have precipitated this thromboembolic event  Alternatively, patient has a thyroid nodule found incidentally on CT imaging -we will obtain thyroid ultrasound as well as thyroid panel  For anticoagulation, patient has already been initiated on Eliquis which will be continued at 10 mg twice daily for 7 days followed by 5 mg twice daily  Monitoring patient on telemetry  Echocardiogram in the morning  As needed opiate-based analgesics for associated pain  Active Problems:   Chest pain   2 sets of high-sensitivity troponins negative  Chest discomfort likely related to acute pulmonary embolism  Monitoring patient on telemetry  Remainder of assessment and plan as above    Closed fracture of left orbital floor [facility])   Patient additionally experienced an episode of syncope, resulting in striking his head on his nightstand  CT imaging revealing acute left orbital blowout fracture involving the inferior orbital wall  Case discussed with  Dr. 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  Dispo: The patient is from: Home              Anticipated d/c is to: Home              Anticipated d/c date is: 2 days              Patient currently is not medically stable to d/c.   Difficult to place patient No        Vernelle Emerald MD [facility] Pager [phone]  If 7PM-7AM, please contact night-coverage [email or web address] Use universal [place] password for that web site. If you do not have the password, please call the hospital operator.  09/02/2020, 6:14 PM

## 2020-09-02 NOTE — ED Triage Notes (Signed)
Reports pain to center of chest that radiated to R arm last night with SOB.  Pt had a syncopal episode during the night when coming back from bathroom and hit bridge of nose on dresser.  Laceration noted.  Bleeding controlled- band aid in place.  Denies chest pain at present.

## 2020-09-03 ENCOUNTER — Observation Stay (HOSPITAL_BASED_OUTPATIENT_CLINIC_OR_DEPARTMENT_OTHER): Payer: Managed Care, Other (non HMO)

## 2020-09-03 DIAGNOSIS — R55 Syncope and collapse: Secondary | ICD-10-CM | POA: Diagnosis not present

## 2020-09-03 DIAGNOSIS — R079 Chest pain, unspecified: Secondary | ICD-10-CM | POA: Diagnosis not present

## 2020-09-03 DIAGNOSIS — I10 Essential (primary) hypertension: Secondary | ICD-10-CM | POA: Diagnosis not present

## 2020-09-03 DIAGNOSIS — S0232XA Fracture of orbital floor, left side, initial encounter for closed fracture: Secondary | ICD-10-CM | POA: Diagnosis not present

## 2020-09-03 DIAGNOSIS — I2692 Saddle embolus of pulmonary artery without acute cor pulmonale: Secondary | ICD-10-CM | POA: Diagnosis not present

## 2020-09-03 DIAGNOSIS — I2699 Other pulmonary embolism without acute cor pulmonale: Secondary | ICD-10-CM | POA: Diagnosis not present

## 2020-09-03 LAB — CBC WITH DIFFERENTIAL/PLATELET
Abs Immature Granulocytes: 0.05 10*3/uL (ref 0.00–0.07)
Basophils Absolute: 0.1 10*3/uL (ref 0.0–0.1)
Basophils Relative: 1 %
Eosinophils Absolute: 0.2 10*3/uL (ref 0.0–0.5)
Eosinophils Relative: 2 %
HCT: 41.1 % (ref 39.0–52.0)
Hemoglobin: 14.5 g/dL (ref 13.0–17.0)
Immature Granulocytes: 1 %
Lymphocytes Relative: 20 %
Lymphs Abs: 2 10*3/uL (ref 0.7–4.0)
MCH: 30.9 pg (ref 26.0–34.0)
MCHC: 35.3 g/dL (ref 30.0–36.0)
MCV: 87.6 fL (ref 80.0–100.0)
Monocytes Absolute: 1.4 10*3/uL — ABNORMAL HIGH (ref 0.1–1.0)
Monocytes Relative: 14 %
Neutro Abs: 6.2 10*3/uL (ref 1.7–7.7)
Neutrophils Relative %: 62 %
Platelets: 234 10*3/uL (ref 150–400)
RBC: 4.69 MIL/uL (ref 4.22–5.81)
RDW: 13.1 % (ref 11.5–15.5)
WBC: 10 10*3/uL (ref 4.0–10.5)
nRBC: 0 % (ref 0.0–0.2)

## 2020-09-03 LAB — PROTIME-INR
INR: 1.3 — ABNORMAL HIGH (ref 0.8–1.2)
Prothrombin Time: 16.1 seconds — ABNORMAL HIGH (ref 11.4–15.2)

## 2020-09-03 LAB — ECHOCARDIOGRAM COMPLETE
Area-P 1/2: 2.66 cm2
Height: 71 in
S' Lateral: 3.4 cm
Weight: 4160 oz

## 2020-09-03 LAB — COMPREHENSIVE METABOLIC PANEL
ALT: 25 U/L (ref 0–44)
AST: 20 U/L (ref 15–41)
Albumin: 3.5 g/dL (ref 3.5–5.0)
Alkaline Phosphatase: 46 U/L (ref 38–126)
Anion gap: 11 (ref 5–15)
BUN: 17 mg/dL (ref 8–23)
CO2: 24 mmol/L (ref 22–32)
Calcium: 9 mg/dL (ref 8.9–10.3)
Chloride: 103 mmol/L (ref 98–111)
Creatinine, Ser: 0.99 mg/dL (ref 0.61–1.24)
GFR, Estimated: >60 mL/min (ref >60)
Glucose, Bld: 122 mg/dL — ABNORMAL HIGH (ref 70–99)
Potassium: 4.1 mmol/L (ref 3.5–5.1)
Sodium: 138 mmol/L (ref 135–145)
Total Bilirubin: 0.7 mg/dL (ref 0.3–1.2)
Total Protein: 6.3 g/dL — ABNORMAL LOW (ref 6.5–8.1)

## 2020-09-03 LAB — MAGNESIUM: Magnesium: 1.8 mg/dL (ref 1.7–2.4)

## 2020-09-03 LAB — HIV ANTIBODY (ROUTINE TESTING W REFLEX): HIV Screen 4th Generation wRfx: NONREACTIVE

## 2020-09-03 LAB — T4, FREE: Free T4: 0.97 ng/dL (ref 0.61–1.12)

## 2020-09-03 LAB — TSH: TSH: 0.877 u[IU]/mL (ref 0.350–4.500)

## 2020-09-03 LAB — APTT: aPTT: 31 seconds (ref 24–36)

## 2020-09-03 MED ORDER — HYDROCHLOROTHIAZIDE 12.5 MG PO TABS: 12.5000 mg | ORAL_TABLET | Freq: Every day | ORAL | Status: DC

## 2020-09-03 MED ORDER — APIXABAN (ELIQUIS) VTE STARTER PACK (10MG AND 5MG): ORAL_TABLET | ORAL | 2 refills | Status: DC

## 2020-09-03 NOTE — Discharge Instructions (Signed)
Information on my medicine - ELIQUIS (apixaban)  Why was Eliquis prescribed for you? 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  What do You need to know about Eliquis ? The starting dose is 10 mg (two 5 mg tablets) taken TWICE daily for the FIRST SEVEN (7) DAYS, then on 09/09/2020 the dose is reduced to ONE 5 mg tablet taken TWICE daily.  Eliquis may be taken with or without food.   Try to take the dose about the same time in the morning and in the evening. If you have difficulty swallowing the tablet whole please discuss with your pharmacist how to take the medication safely.  Take Eliquis exactly as prescribed and DO NOT stop taking Eliquis without talking to the doctor who prescribed the medication.  Stopping may increase your risk of developing a new blood clot.  Refill your prescription before you run out.  After discharge, you should have regular check-up appointments with your healthcare provider that is prescribing your Eliquis.    What do you do if you miss a dose? If a dose of ELIQUIS is not taken at the scheduled time, take it as soon as possible on the same day and twice-daily administration should be resumed. The dose should not be doubled to make up for a missed dose.  Important Safety Information A possible side effect of Eliquis is bleeding. You should call your healthcare provider right away if you experience any of the following: ? Bleeding from an injury or your nose that does not stop. ? Unusual colored urine (red or dark brown) or unusual colored stools (red or black). ? Unusual bruising for unknown reasons. ? A serious fall or if you hit your head (even if there is no bleeding).  Some medicines may interact with Eliquis and might increase your risk of bleeding or clotting while on Eliquis. To help  avoid this, consult your healthcare provider or pharmacist prior to using any new prescription or non-prescription medications, including herbals, vitamins, non-steroidal anti-inflammatory drugs (NSAIDs) and supplements.  This website has more information on Eliquis (apixaban): http://www.eliquis.com/eliquis/home

## 2020-09-03 NOTE — ED Notes (Signed)
Tele Breakfast Order Placed

## 2020-09-03 NOTE — Progress Notes (Signed)
Patient/family request to speak with attending MD prior to discharge. Attending MD notified.

## 2020-09-03 NOTE — Progress Notes (Signed)
  Echocardiogram 2D Echocardiogram has been performed.  Jennette Dubin 09/03/2020, 11:20 AM

## 2020-09-03 NOTE — Discharge Summary (Signed)
Physician Discharge Summary  Samuel Jacobs SEG:[id number] DOB: December 14, 1950 DOA: 09/02/2020  PCP: Prince Solian, MD  Admit date: 09/02/2020 Discharge date: 09/03/2020  Admitted From: Home  Discharge disposition: Home   Recommendations for Outpatient Follow-Up:   . Follow up with your primary care provider in one week.  . Check CBC, BMP, magnesium in the next visit . Patient does have a left orbital floor fracture which will need to be followed by Dr. Marcelline Deist ENT in 5 to 7 days. . Patient has been started on Eliquis but has history of spitting up blood chronically.  Will need to discuss about further plans if he has increased bleeding. . Hemoglobin A1c was noted at 6.6.  Possibility of new diabetes.  Patient will need PCP follow-up and possible initiation of OHA.  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  Diet recommendation: Low sodium, heart healthy.    Wound care: None.  Code status: Full.   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  Patient woke up from sleep secondary to arm pain.  When he attempted to get out of bed he felt dizzy lightheaded and lost consciousness.  No seizure-like activity.  Patient was then brought in the hospital.  Troponins were insignificant.     Due to facial trauma and patient's episode of loss of consciousness, a CT trauma survey was performed revealing a acute left orbital blowout fracture involving the inferior orbital wall.  Department provider discussed this with Dr. Marcelline Deist with ENT who reviewed the images and recommended outpatient follow-up in 5 days with [facility].  CT angiogram of the chest revealed a right lower lobe pulmonary embolism without evidence of right heart strain.    Patient was then admitted hospital for further evaluation and treatment.  Patient reported extremely long ride to [place] several weeks ago.  He does have history of daily hemoptysis 1-2 times which was negative despite extensive work-up by pulmonary and ENT in the past.   Hospital Course:   Following conditions were addressed during hospitalization as listed below,  Acute pulmonary embolism without acute cor pulmonale  Patient has been initiated on Eliquis.  Extensive discussion was made regarding the risk versus benefits of anticoagulant.  Patient does have history of hemoptysis which is going on for some time now of unknown etiology despite extensive work-up.  Patient does blow his nose quite often and I have discouraged him to do that..  I have explained to the patient that if his hemoptysis were to get worse he would need to stop anticoagulation and seek immediate medical attention and discuss about further plans in the future.  Patient clearly understood this at this time.  2D echocardiogram was performed which did not show any RV strain.  LV function was preserved.  Chest pain secondary to acute pulmonary bleeding.  Troponins were negative.  Closed fracture of left orbital floor  Likely secondary to fall and syncope.  Will need to follow-up with ENT as outpatient.    Syncope/fall Patient was encouraged to take time to change positions.  Encouraged on hydration.  Echocardiogram with preserved LV function.  Telemetry without arrhythmias.   Troponins were unremarkable.  History of focal epilepsy with simple partial seizures No seizures at this time.  Continue Vimpat.    Essential hypertension Resume home medication.  Orthostatic precautions have been advised to the patient.    Mixed hyperlipidemia Continue Lipitor    Hyperglycemia Hemoglobin A1c of 6.6.  Patient will need to follow-up with PCP for further treatment in the future.  Likely new diabetes.  Might need to repeat A1c again.    GERD without esophagitis  Continue PPI  Disposition.  At this time, patient is stable for disposition with outpatient PCP, ENT follow-up.  Medical Consultants:    None  Procedures:    2D echocardiogram, CTA Subjective:   Today, patient was seen and examined at bedside.  Denies any chest pain, dizziness, headedness, shortness of breath.  Discharge Exam:   Vitals:   09/03/20 1300 09/03/20 1310  BP: 137/74   Pulse: 60 63  Resp: 12 15  Temp: 98.3 F (36.8 C)   SpO2:     Vitals:   09/03/20 1230 09/03/20 1245 09/03/20 1300 09/03/20 1310  BP: (!) 128/106 135/72 137/74   Pulse: (!) 57 (!) 52 60 63  Resp: 13 17 12 15   Temp:   98.3 F (36.8 C)   TempSrc:   Oral   SpO2: 96% 95%    Weight:      Height:        General: Alert awake, not in obvious distress HENT: pupils equally reacting to light,  No scleral pallor or icterus noted. Oral mucosa is moist.  Chest:  Clear breath sounds.  Diminished breath sounds bilaterally. No crackles or wheezes.  CVS: S1 &S2 heard. No murmur.  Regular rate and rhythm. Abdomen: Soft, nontender, nondistended.  Bowel sounds are heard.   Extremities: No cyanosis, clubbing or edema.  Peripheral pulses are palpable. Psych: Alert, awake and oriented, normal mood CNS:  No cranial nerve deficits.  Power equal in all extremities.   Skin: Warm and dry.  No rashes noted.  The results of significant diagnostics from this hospitalization (including imaging, microbiology, ancillary and laboratory)  are listed below for reference.     Diagnostic Studies:   DG Chest 2 View  Result Date: 09/02/2020 CLINICAL DATA:  Chest pain.  Shortness of breath. EXAM: CHEST - 2 VIEW COMPARISON:  08/16/2018.  04/15/2015. FINDINGS: Mediastinum and hilar structures normal. Heart size normal. No focal infiltrate. Stable mild left base pleural thickening consistent scarring. IMPRESSION: No active cardiopulmonary disease. Electronically Signed   By: Marcello Moores  Register   On: 09/02/2020 11:03   CT Head Wo Contrast  Addendum Date: 09/02/2020   ADDENDUM REPORT: 09/02/2020 14:42 ADDENDUM: These results were called by telephone at the time of interpretation on 09/02/2020 at 2:41 pm to provider ADAM CURATOLO , who verbally acknowledged these results. Electronically Signed   By: Zerita Boers M.D.   On: 09/02/2020 14:42   Result Date: 09/02/2020 CLINICAL DATA:  Shortness of breath and a fall from standing last night with head and neck pain. EXAM: CT HEAD WITHOUT CONTRAST CT MAXILLOFACIAL WITHOUT CONTRAST CT CERVICAL SPINE WITHOUT CONTRAST TECHNIQUE: Multidetector CT imaging of the head, cervical spine, and maxillofacial structures were performed using the  standard protocol without intravenous contrast. Multiplanar CT image reconstructions of the cervical spine and maxillofacial structures were also generated. COMPARISON:  CT head dated 10/16/2011. FINDINGS: CT HEAD FINDINGS Brain: No evidence of acute infarction, hemorrhage, hydrocephalus, extra-axial collection or mass lesion/mass effect. A 4 mm cyst anterior and superior to the anterior horn of the right lateral ventricle. This is likely benign and may represent a perivascular space or a chronic lacunar infarct. Vascular: There are vascular calcifications in the carotid siphons. Skull: Normal. Negative for fracture or focal lesion. Other: None. CT MAXILLOFACIAL FINDINGS Osseous: There is an acute left orbital blowout fracture involving the inferior orbital wall. The fracture  extends to the infraorbital canal. There is mild associated soft tissue swelling and gas. There is no evidence of herniation of the inferior rectus muscle. Orbits: No intraconal gas or hematoma on either side. The globes appear normal. Sinuses: Minimal fluid is seen in the left maxillary sinus, likely representing blood products. Soft tissues: There is soft tissue swelling overlying the nose and periorbital region with soft tissue gas near the bridge of the nose, likely reflecting a skin laceration. CT CERVICAL SPINE FINDINGS Alignment: Normal. Skull base and vertebrae: No acute fracture. No primary bone lesion or focal pathologic process. Soft tissues and spinal canal: No prevertebral fluid or swelling. No visible canal hematoma. Disc levels: Up to moderate to severe multilevel degenerative disc and joint disease. Degenerative changes at the level of C4-C6 result in moderate narrowing of the central canal. Upper chest: Negative. Other: A 1.5 cm nodule is seen in the left thyroid. IMPRESSION: 1. Acute left orbital blowout fracture involving the inferior orbital wall. The fracture extends to the infraorbital canal. No evidence of herniation of the inferior rectus muscle. 2. No acute intracranial process. 3. No acute osseous injury in the cervical spine. 4. A 1.5 cm nodule is seen in the left thyroid lobe. Recommend thyroid US on a nonemergent basis for further evaluation. (Ref: J Am Coll Radiol. 2015 Feb;12(2): 143-50). Electronically Signed: By: Zerita Boers M.D. On: 09/02/2020 14:23   CT Angio Chest PE W and/or Wo Contrast  Result Date: 09/02/2020 CLINICAL DATA:  Chest pain, shortness of breath. EXAM: CT ANGIOGRAPHY CHEST WITH CONTRAST TECHNIQUE: Multidetector CT imaging of the chest was performed using the standard protocol during bolus administration of intravenous contrast. Multiplanar CT image reconstructions and MIPs were obtained to evaluate the vascular anatomy. CONTRAST:  26mL OMNIPAQUE IOHEXOL 350  MG/ML SOLN COMPARISON:  Chest radiograph performed the same day and chest CT dated 08/16/2018. FINDINGS: Cardiovascular: Satisfactory opacification of the pulmonary arteries to the segmental level. A filling defect in a subsegmental pulmonary artery supplying the right lower lobe is consistent with a pulmonary embolism (series 5, image 296). The right and left pulmonary arteries are enlarged, measuring 2.7 cm and 2.4 cm respectively, suggestive of pulmonary hypertension. The ascending aorta measures 4.0 cm in diameter. Vascular calcifications are seen in the aortic arch. Normal heart size. No pericardial effusion. Mediastinum/Nodes: No enlarged mediastinal, hilar, or axillary lymph nodes. A hypoattenuating left thyroid nodule measures 1.5 cm. The trachea and esophagus demonstrate no significant findings. Lungs/Pleura: There is mild bibasilar atelectasis. No pleural effusion or pneumothorax. Upper Abdomen: No acute abnormality. Musculoskeletal: Degenerative changes are seen in the spine. Review of the MIP images confirms the above findings. IMPRESSION: 1. Pulmonary embolism in a subsegmental pulmonary artery supplying the right lower lobe. 2. Enlarged right and left pulmonary arteries, suggestive of pulmonary hypertension. 3. A 1.5 cm left thyroid nodule  is noted. Non emergent thyroid ultrasound is recommended. (Ref: J Am Coll Radiol. 2015 Feb;12(2): 143-50). Aortic Atherosclerosis (ICD10-I70.0). These results were called by telephone at the time of interpretation on 09/02/2020 at 2:41 pm to provider ADAM CURATOLO , who verbally acknowledged these results. Electronically Signed   By: Zerita Boers M.D.   On: 09/02/2020 14:39   CT Cervical Spine Wo Contrast  Addendum Date: 09/02/2020   ADDENDUM REPORT: 09/02/2020 14:42 ADDENDUM: These results were called by telephone at the time of interpretation on 09/02/2020 at 2:41 pm to provider ADAM CURATOLO , who verbally acknowledged these results. Electronically Signed   By:  Zerita Boers M.D.   On: 09/02/2020 14:42   Result Date: 09/02/2020 CLINICAL DATA:  Shortness of breath and a fall from standing last night with head and neck pain. EXAM: CT HEAD WITHOUT CONTRAST CT MAXILLOFACIAL WITHOUT CONTRAST CT CERVICAL SPINE WITHOUT CONTRAST TECHNIQUE: Multidetector CT imaging of the head, cervical spine, and maxillofacial structures were performed using the standard protocol without intravenous contrast. Multiplanar CT image reconstructions of the cervical spine and maxillofacial structures were also generated. COMPARISON:  CT head dated 10/16/2011. FINDINGS: CT HEAD FINDINGS Brain: No evidence of acute infarction, hemorrhage, hydrocephalus, extra-axial collection or mass lesion/mass effect. A 4 mm cyst anterior and superior to the anterior horn of the right lateral ventricle. This is likely benign and may represent a perivascular space or a chronic lacunar infarct. Vascular: There are vascular calcifications in the carotid siphons. Skull: Normal. Negative for fracture or focal lesion. Other: None. CT MAXILLOFACIAL FINDINGS Osseous: There is an acute left orbital blowout fracture involving the inferior orbital wall. The fracture extends to the infraorbital canal. There is mild associated soft tissue swelling and gas. There is no evidence of herniation of the inferior rectus muscle. Orbits: No intraconal gas or hematoma on either side. The globes appear normal. Sinuses: Minimal fluid is seen in the left maxillary sinus, likely representing blood products. Soft tissues: There is soft tissue swelling overlying the nose and periorbital region with soft tissue gas near the bridge of the nose, likely reflecting a skin laceration. CT CERVICAL SPINE FINDINGS Alignment: Normal. Skull base and vertebrae: No acute fracture. No primary bone lesion or focal pathologic process. Soft tissues and spinal canal: No prevertebral fluid or swelling. No visible canal hematoma. Disc levels: Up to moderate to  severe multilevel degenerative disc and joint disease. Degenerative changes at the level of C4-C6 result in moderate narrowing of the central canal. Upper chest: Negative. Other: A 1.5 cm nodule is seen in the left thyroid. IMPRESSION: 1. Acute left orbital blowout fracture involving the inferior orbital wall. The fracture extends to the infraorbital canal. No evidence of herniation of the inferior rectus muscle. 2. No acute intracranial process. 3. No acute osseous injury in the cervical spine. 4. A 1.5 cm nodule is seen in the left thyroid lobe. Recommend thyroid US on a nonemergent basis for further evaluation. (Ref: J Am Coll Radiol. 2015 Feb;12(2): 143-50). Electronically Signed: By: Zerita Boers M.D. On: 09/02/2020 14:23   ECHOCARDIOGRAM COMPLETE  Result Date: 09/03/2020    ECHOCARDIOGRAM REPORT   Patient Name:   Samuel Jacobs  Date of Exam: 09/03/2020 Medical Rec #:  [id number]  Height:       71.0 in Accession #:    [id number] Weight:       260.0 lb Date of Birth:  1950-10-18  BSA:          2.357 m Patient Age:  70 years   BP:           126/73 mmHg Patient Gender: M          HR:           61 bpm. Exam Location:  Inpatient Procedure: 2D Echo Indications:    Pulmonary Embolus I26.99; Syncope  History:        Patient has no prior history of Echocardiogram examinations.                 Risk Factors:Hypertension and Dyslipidemia.  Sonographer:    Mikki Santee RDCS (AE) Referring Phys: [id number] [place]  1. Left ventricular ejection fraction, by estimation, is 55 to 60%. The left ventricle has normal function. The left ventricle has no regional wall motion abnormalities. There is mild concentric left ventricular hypertrophy. Left ventricular diastolic parameters are consistent with Grade II diastolic dysfunction (pseudonormalization).  2. Right ventricular systolic function is mildly reduced. The right ventricular size is normal.  3. Left atrial size was mildly dilated.  4. The pericardial  effusion is posterior to the left ventricle.  5. The mitral valve is normal in structure. No evidence of mitral valve regurgitation. No evidence of mitral stenosis.  6. The aortic valve is normal in structure. Aortic valve regurgitation is not visualized. No aortic stenosis is present.  7. Aortic dilatation noted. There is mild dilatation of the aortic root, measuring 42 mm. There is mild dilatation of the ascending aorta, measuring 42 mm.  8. The inferior vena cava is normal in size with greater than 50% respiratory variability, suggesting right atrial pressure of 3 mmHg. FINDINGS  Left Ventricle: Left ventricular ejection fraction, by estimation, is 55 to 60%. The left ventricle has normal function. The left ventricle has no regional wall motion abnormalities. The left ventricular internal cavity size was normal in size. There is  mild concentric left ventricular hypertrophy. Left ventricular diastolic parameters are consistent with Grade II diastolic dysfunction (pseudonormalization). Normal left ventricular filling pressure. Right Ventricle: The right ventricular size is normal. No increase in right ventricular wall thickness. Right ventricular systolic function is mildly reduced. Left Atrium: Left atrial size was mildly dilated. Right Atrium: Right atrial size was normal in size. Pericardium: Trivial pericardial effusion is present. The pericardial effusion is posterior to the left ventricle. Mitral Valve: The mitral valve is normal in structure. No evidence of mitral valve regurgitation. No evidence of mitral valve stenosis. Tricuspid Valve: The tricuspid valve is normal in structure. Tricuspid valve regurgitation is not demonstrated. No evidence of tricuspid stenosis. Aortic Valve: The aortic valve is normal in structure. Aortic valve regurgitation is not visualized. No aortic stenosis is present. Pulmonic Valve: The pulmonic valve was normal in structure. Pulmonic valve regurgitation is trivial. No evidence  of pulmonic stenosis. Aorta: Aortic dilatation noted. There is mild dilatation of the aortic root, measuring 42 mm. There is mild dilatation of the ascending aorta, measuring 42 mm. Venous: The inferior vena cava is normal in size with greater than 50% respiratory variability, suggesting right atrial pressure of 3 mmHg. IAS/Shunts: No atrial level shunt detected by color flow Doppler.  LEFT VENTRICLE PLAX 2D LVIDd:         4.90 cm  Diastology LVIDs:         3.40 cm  LV e' medial:    5.77 cm/s LV PW:         1.20 cm  LV E/e' medial:  11.4 LV IVS:  1.20 cm  LV e' lateral:   9.36 cm/s LVOT diam:     2.60 cm  LV E/e' lateral: 7.1 LV SV:         125 LV SV Index:   53 LVOT Area:     5.31 cm  RIGHT VENTRICLE RV S prime:     10.80 cm/s TAPSE (M-mode): 2.4 cm LEFT ATRIUM           Index       RIGHT ATRIUM           Index LA diam:      4.60 cm 1.95 cm/m  RA Area:     17.20 cm LA Vol (A2C): 87.5 ml 37.12 ml/m RA Volume:   33.90 ml  14.38 ml/m LA Vol (A4C): 84.2 ml 35.72 ml/m  AORTIC VALVE LVOT Vmax:   88.10 cm/s LVOT Vmean:  67.100 cm/s LVOT VTI:    0.235 m  AORTA Ao Root diam: 4.20 cm MITRAL VALVE MV Area (PHT): 2.66 cm    SHUNTS MV Decel Time: 285 msec    Systemic VTI:  0.24 m MV E velocity: 66.00 cm/s  Systemic Diam: 2.60 cm MV A velocity: 63.40 cm/s MV E/A ratio:  1.04 Fransico Him MD Electronically signed by Fransico Him MD Signature Date/Time: 09/03/2020/12:26:20 PM    Final    US THYROID  Result Date: 09/03/2020 CLINICAL DATA:  Thyroid nodule EXAM: THYROID ULTRASOUND TECHNIQUE: Ultrasound examination of the thyroid gland and adjacent soft tissues was performed. COMPARISON:  None. FINDINGS: Parenchymal Echotexture: Mildly heterogeneous Isthmus: 0.4 cm Right lobe: 6.7 x 2.8 x 3.0 cm Left lobe: 5.6 x 2.9 x 2.3 cm _________________________________________________________ Estimated total number of nodules >/= 1 cm: 2 Number of spongiform nodules >/=  2 cm not described below (TR1): 0 Number of mixed cystic  and solid nodules >/= 1.5 cm not described below (TR2): 0 _________________________________________________________ Nodule 1: 1.0 cm cystic nodule in the right thyroid lobe does not meet criteria for FNA or imaging follow-up. _________________________________________________________ Nodule 2: 1.3 cm spongiform nodule in the left thyroid lobe does not meet criteria for FNA or imaging follow-up. _________________________________________________________ 0.6 cm calcification in the left thyroid lobe does not meet criteria for FNA or imaging follow-up. IMPRESSION: Bilateral thyroid nodules do not meet criteria for FNA or imaging follow-up. The above is in keeping with the ACR TI-RADS recommendations - J Am Coll Radiol 2017;14:587-595. Electronically Signed   By: Miachel Roux M.D.   On: 09/03/2020 07:35   CT Maxillofacial Wo Contrast  Addendum Date: 09/02/2020   ADDENDUM REPORT: 09/02/2020 14:42 ADDENDUM: These results were called by telephone at the time of interpretation on 09/02/2020 at 2:41 pm to provider ADAM CURATOLO , who verbally acknowledged these results. Electronically Signed   By: Zerita Boers M.D.   On: 09/02/2020 14:42   Result Date: 09/02/2020 CLINICAL DATA:  Shortness of breath and a fall from standing last night with head and neck pain. EXAM: CT HEAD WITHOUT CONTRAST CT MAXILLOFACIAL WITHOUT CONTRAST CT CERVICAL SPINE WITHOUT CONTRAST TECHNIQUE: Multidetector CT imaging of the head, cervical spine, and maxillofacial structures were performed using the standard protocol without intravenous contrast. Multiplanar CT image reconstructions of the cervical spine and maxillofacial structures were also generated. COMPARISON:  CT head dated 10/16/2011. FINDINGS: CT HEAD FINDINGS Brain: No evidence of acute infarction, hemorrhage, hydrocephalus, extra-axial collection or mass lesion/mass effect. A 4 mm cyst anterior and superior to the anterior horn of the right lateral ventricle. This is likely benign  and  may represent a perivascular space or a chronic lacunar infarct. Vascular: There are vascular calcifications in the carotid siphons. Skull: Normal. Negative for fracture or focal lesion. Other: None. CT MAXILLOFACIAL FINDINGS Osseous: There is an acute left orbital blowout fracture involving the inferior orbital wall. The fracture extends to the infraorbital canal. There is mild associated soft tissue swelling and gas. There is no evidence of herniation of the inferior rectus muscle. Orbits: No intraconal gas or hematoma on either side. The globes appear normal. Sinuses: Minimal fluid is seen in the left maxillary sinus, likely representing blood products. Soft tissues: There is soft tissue swelling overlying the nose and periorbital region with soft tissue gas near the bridge of the nose, likely reflecting a skin laceration. CT CERVICAL SPINE FINDINGS Alignment: Normal. Skull base and vertebrae: No acute fracture. No primary bone lesion or focal pathologic process. Soft tissues and spinal canal: No prevertebral fluid or swelling. No visible canal hematoma. Disc levels: Up to moderate to severe multilevel degenerative disc and joint disease. Degenerative changes at the level of C4-C6 result in moderate narrowing of the central canal. Upper chest: Negative. Other: A 1.5 cm nodule is seen in the left thyroid. IMPRESSION: 1. Acute left orbital blowout fracture involving the inferior orbital wall. The fracture extends to the infraorbital canal. No evidence of herniation of the inferior rectus muscle. 2. No acute intracranial process. 3. No acute osseous injury in the cervical spine. 4. A 1.5 cm nodule is seen in the left thyroid lobe. Recommend thyroid US on a nonemergent basis for further evaluation. (Ref: J Am Coll Radiol. 2015 Feb;12(2): 143-50). Electronically Signed: By: Zerita Boers M.D. On: 09/02/2020 14:23     Labs:   Basic Metabolic Panel: Recent Labs  Lab 09/02/20 1116 09/03/20 0225  NA 136 138  K  4.5 4.1  CL 100 103  CO2 27 24  GLUCOSE 177* 122*  BUN 22 17  CREATININE 1.17 0.99  CALCIUM 9.4 9.0  MG  --  1.8   GFR Estimated Creatinine Clearance: 90.6 mL/min (by C-G formula based on SCr of 0.99 mg/dL). Liver Function Tests: Recent Labs  Lab 09/03/20 0225  AST 20  ALT 25  ALKPHOS 46  BILITOT 0.7  PROT 6.3*  ALBUMIN 3.5   No results for input(s): LIPASE, AMYLASE in the last 168 hours. No results for input(s): AMMONIA in the last 168 hours. Coagulation profile Recent Labs  Lab 09/03/20 0225  INR 1.3*    CBC: Recent Labs  Lab 09/02/20 1116 09/03/20 0225  WBC 10.6* 10.0  NEUTROABS  --  6.2  HGB 15.4 14.5  HCT 44.0 41.1  MCV 89.1 87.6  PLT 265 234   Cardiac Enzymes: No results for input(s): CKTOTAL, CKMB, CKMBINDEX, TROPONINI in the last 168 hours. BNP: Invalid input(s): POCBNP CBG: No results for input(s): GLUCAP in the last 168 hours. D-Dimer No results for input(s): DDIMER in the last 72 hours. Hgb A1c Recent Labs    09/02/20 1116  HGBA1C 6.6*   Lipid Profile No results for input(s): CHOL, HDL, LDLCALC, TRIG, CHOLHDL, LDLDIRECT in the last 72 hours. Thyroid function studies Recent Labs    09/03/20 0225  TSH 0.877   Anemia work up No results for input(s): VITAMINB12, FOLATE, FERRITIN, TIBC, IRON, RETICCTPCT in the last 72 hours. Microbiology Recent Results (from the past 240 hour(s))  Resp Panel by RT-PCR (Flu A&B, Covid) Nasopharyngeal Swab     Status: None   Collection Time: 09/02/20  1:02  PM   Specimen: Nasopharyngeal Swab; Nasopharyngeal(NP) swabs in vial transport medium  Result Value Ref Range Status   SARS Coronavirus 2 by RT PCR NEGATIVE NEGATIVE Final    Comment: (NOTE) SARS-CoV-2 target nucleic acids are NOT DETECTED.  The SARS-CoV-2 RNA is generally detectable in upper respiratory specimens during the acute phase of infection. The lowest concentration of SARS-CoV-2 viral copies this assay can detect is 138 copies/mL. A  negative result does not preclude SARS-Cov-2 infection and should not be used as the sole basis for treatment or other patient management decisions. A negative result may occur with  improper specimen collection/handling, submission of specimen other than nasopharyngeal swab, presence of viral mutation(s) within the areas targeted by this assay, and inadequate number of viral copies(<138 copies/mL). A negative result must be combined with clinical observations, patient history, and epidemiological information. The expected result is Negative.  Fact Sheet for Patients:  EntrepreneurPulse.com.au  Fact Sheet for Healthcare Providers:  IncredibleEmployment.be  This test is no t yet approved or cleared by the [place] FDA and  has been authorized for detection and/or diagnosis of SARS-CoV-2 by FDA under an Emergency Use Authorization (EUA). This EUA will remain  in effect (meaning this test can be used) for the duration of the COVID-19 declaration under Section 564(b)(1) of the Act, 21 U.S.C.section 360bbb-3(b)(1), unless the authorization is terminated  or revoked sooner.       Influenza A by PCR NEGATIVE NEGATIVE Final   Influenza B by PCR NEGATIVE NEGATIVE Final    Comment: (NOTE) The Xpert Xpress SARS-CoV-2/FLU/RSV plus assay is intended as an aid in the diagnosis of influenza from Nasopharyngeal swab specimens and should not be used as a sole basis for treatment. Nasal washings and aspirates are unacceptable for Xpert Xpress SARS-CoV-2/FLU/RSV testing.  Fact Sheet for Patients: EntrepreneurPulse.com.au  Fact Sheet for Healthcare Providers: IncredibleEmployment.be  This test is not yet approved or cleared by the [place] FDA and has been authorized for detection and/or diagnosis of SARS-CoV-2 by FDA under an Emergency Use Authorization (EUA). This EUA will remain in effect (meaning this test can  be used) for the duration of the COVID-19 declaration under Section 564(b)(1) of the Act, 21 U.S.C. section 360bbb-3(b)(1), unless the authorization is terminated or revoked.  Performed at [facility], [place] [address], [place], [place] [address]      Discharge Instructions:   Discharge Instructions    Diet - low sodium heart healthy   Complete by: As directed    Discharge instructions   Complete by: As directed    Please take time to change positions especially from lying to sitting up or standing position.  Follow-up with your primary care physician in 1 week.  Follow-up with Dr. Shirlean Schlein ENT in 1 week for fracture of the orbital bone (call office for appointment).  Continue to take blood thinner as prescribed.  If you experience worsening bleeding or symptoms,pleas seek medical help.  Do not overexert. Try to limit meloxicam/motrin with blood thinner   Increase activity slowly   Complete by: As directed      Allergies as of 09/03/2020      Reactions   Elemental Sulfur Nausea Only      Medication List    STOP taking these medications   ibuprofen 200 MG tablet Commonly known as: ADVIL     TAKE these medications   albuterol 108 (90 Base) MCG/ACT inhaler Commonly known as: VENTOLIN HFA Inhale 2 puffs into the lungs every  4 (four) hours as needed for wheezing or shortness of breath.   Apixaban Starter Pack (10mg  and 5mg ) Commonly known as: ELIQUIS STARTER PACK Take as directed on package: start with two-5mg  tablets twice daily for 7 days. On day 8, switch to one-5mg  tablet twice daily to continue for 3 months   atorvastatin 10 MG tablet Commonly known as: LIPITOR TAKE 1 TABLET BY MOUTH EVERY DAY   hydrochlorothiazide 12.5 MG tablet Commonly known as: HYDRODIURIL Take 1 tablet (12.5 mg total) by mouth daily. Start taking on: September 05, 2020 What changed: These instructions start on September 05, 2020. If you are unsure what to do until then, ask your doctor or  other care provider.   HYDROcodone-acetaminophen 5-325 MG tablet Commonly known as: NORCO/VICODIN Take 1 tablet by mouth daily.   irbesartan 300 MG tablet Commonly known as: AVAPRO Take 300 mg by mouth daily.   loratadine 10 MG tablet Commonly known as: CLARITIN Take 10 mg by mouth daily.   meloxicam 15 MG tablet Commonly known as: MOBIC TAKE 1 TABLET BY MOUTH EVERY DAY   metoprolol succinate 25 MG 24 hr tablet Commonly known as: TOPROL-XL Take 25 mg by mouth daily.   pantoprazole 40 MG tablet Commonly known as: PROTONIX Take 40 mg by mouth 2 (two) times daily.   rifampin 300 MG capsule Commonly known as: Rifadin Take 2 capsules (600 mg total) by mouth daily.   traZODone 100 MG tablet Commonly known as: DESYREL Take one tablet daily at bedtime. Please call [phone] to schedule an appt or request refills from PCP. What changed:   how much to take  how to take this  when to take this  additional instructions   Vimpat 150 MG Tabs Generic drug: Lacosamide TAKE 1 TABLET BY MOUTH TWICE A DAY What changed:   how much to take  when to take this       Follow-up Information    Avva, Ravisankar, MD. Schedule an appointment as soon as possible for a visit in 1 week(s).   Specialty: Internal Medicine Contact information: [place] [address] [phone]        Marcina Millard, MD. Schedule an appointment as soon as possible for a visit in 1 week(s).   Specialty: Otolaryngology Why: orbital fracture, follow up within 5-7 days Contact information: [address] [place] [address] [phone]                Time coordinating discharge: 39 minutes  Signed:  Laxman Pokhrel  [facility] 09/03/2020, 1:38 PM

## 2020-09-03 NOTE — Progress Notes (Signed)
PT Cancellation Note  Patient Details Name: Samuel Jacobs MRN: [id number] DOB: Jul 24, 1950   Cancelled Treatment:    Reason Eval/Treat Not Completed: Active bedrest order Pt currently with bed rest orders. Will follow up as schedule allows.   Reuel Derby, PT, DPT  Acute Rehabilitation Services  Pager: [phone] Office: [phone]    Rudean Hitt 09/03/2020, 9:10 AM

## 2020-09-03 NOTE — Evaluation (Signed)
Physical Therapy Evaluation Patient Details Name: Samuel Jacobs MRN: [id number] DOB: 1950/12/29 Today's Date: 09/03/2020   History of Present Illness  Pt is a 70 y/o male admitted secondary to chest pain and syncope. 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 PMH includes HTN and seizure.  Clinical Impression  Pt admitted secondary to problem above with deficits below. Pt asymptomatic throughout mobility tasks and orthostatics negative (see vitals flowsheet). Pt requiring min guard for ambulation within ED room for safety only; no physical assist required. Oxygen sats >94% on RA. Educated about activity pacing at home. Anticipate pt will progress well and will not require follow up PT at d/c. Will continue to follow acutely.     Follow Up Recommendations No PT follow up    Equipment Recommendations  None recommended by PT    Recommendations for Other Services       Precautions / Restrictions Precautions Precautions: Fall Restrictions Weight Bearing Restrictions: No      Mobility  Bed Mobility Overal bed mobility: Needs Assistance Bed Mobility: Supine to Sit;Sit to Supine     Supine to sit: Supervision Sit to supine: Supervision   General bed mobility comments: Supervision for safety and line management.    Transfers Overall transfer level: Needs assistance Equipment used: None Transfers: Sit to/from Stand Sit to Stand: Min guard         General transfer comment: min guard for safety. Pt asymptomatic upon standing.  Ambulation/Gait Ambulation/Gait assistance: Min guard Gait Distance (Feet): 10 Feet Assistive device: None Gait Pattern/deviations: Step-through pattern;Decreased stride length Gait velocity: Decreased   General Gait Details: Min guard for safety. No LOB noted and pt asymptomatic throughout. Oxygen sats >94% on RA throughout.  Stairs            Wheelchair Mobility    Modified Rankin (Stroke Patients Only)       Balance Overall balance  assessment: Mild deficits observed, not formally tested                                           Pertinent Vitals/Pain Pain Assessment: No/denies pain    Home Living Family/patient expects to be discharged to:: Private residence Living Arrangements: Spouse/significant other Available Help at Discharge: Family;Available 24 hours/day Type of Home: House Home Access: Stairs to enter Entrance Stairs-Rails: Right;Left;Can reach both Entrance Stairs-Number of Steps: 3 Home Layout: One level Home Equipment: Cane - single point      Prior Function Level of Independence: Independent         Comments: Works in Therapist, 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     Hand Dominance   Dominant Hand: Right    Extremity/Trunk Assessment   Upper Extremity Assessment Upper Extremity Assessment: Overall WFL for tasks assessed    Lower Extremity Assessment Lower Extremity Assessment: Overall WFL for tasks assessed    Cervical / Trunk Assessment Cervical / Trunk Assessment: Kyphotic  Communication   Communication: No difficulties  Cognition Arousal/Alertness: Awake/alert Behavior During Therapy: WFL for tasks assessed/performed Overall Cognitive Status: Within Functional Limits for tasks assessed                                        General Comments General comments (skin integrity, edema, etc.): Orthostatic vitals negative (see vitals flowsheet). Educated about activity  pacing at home.    Exercises     Assessment/Plan    PT Assessment Patient needs continued PT services  PT Problem List Decreased activity tolerance;Decreased mobility;Decreased knowledge of precautions       PT Treatment Interventions DME instruction;Gait training;Functional mobility training;Therapeutic activities;Therapeutic exercise;Balance training;Patient/family education;Stair training    PT Goals (Current goals can be found in the Care Plan section)  Acute Rehab PT  Goals Patient Stated Goal: to go home PT Goal Formulation: With patient Time For Goal Achievement: 09/17/20 Potential to Achieve Goals: Good    Frequency Min 3X/week   Barriers to discharge        Co-evaluation               AM-PAC PT "6 Clicks" Mobility  Outcome Measure Help needed turning from your back to your side while in a flat bed without using bedrails?: None Help needed moving from lying on your back to sitting on the side of a flat bed without using bedrails?: None Help needed moving to and from a bed to a chair (including a wheelchair)?: A Little Help needed standing up from a chair using your arms (e.g., wheelchair or bedside chair)?: A Little Help needed to walk in hospital room?: A Little Help needed climbing 3-5 steps with a railing? : A Little 6 Click Score: 20    End of Session Equipment Utilized During Treatment: Gait belt Activity Tolerance: Patient tolerated treatment well Patient left: in bed;with call bell/phone within reach (on stretcher in ED) Nurse Communication: Mobility status PT Visit Diagnosis: Other abnormalities of gait and mobility (R26.89)    Time: 0944-1000 PT Time Calculation (min) (ACUTE ONLY): 16 min   Charges:   PT Evaluation $PT Eval Low Complexity: 1 Low          Lou Miner, DPT  Acute Rehabilitation Services  Pager: [phone] Office: [phone]   Rudean Hitt 09/03/2020, 10:10 AM

## 2020-09-04 ENCOUNTER — Other Ambulatory Visit: Payer: Self-pay | Admitting: Internal Medicine

## 2020-09-04 ENCOUNTER — Other Ambulatory Visit: Payer: Self-pay | Admitting: Cardiology

## 2020-09-04 LAB — T3: T3, Total: 108 ng/dL (ref 71–180)

## 2020-09-04 MED FILL — ELIQUIS STARTER PACK 5 MG T: 5 | 30 days supply | Qty: 74 | Fill #0

## 2020-09-04 NOTE — Progress Notes (Signed)
Patient discharged from ED on apixaban before education could be completed. Called patient and provided education to patient and wife and answered all questions. Patient and wife reported requires prior authorization - explained PA process and instructed to have [facility] send prior authorization request to PCP instead of hospital to ensure it is completed. Patient to pick up apixaban from [facility] using copay card.   Cristela Felt, PharmD Clinical Pharmacist

## 2020-09-26 ENCOUNTER — Other Ambulatory Visit (HOSPITAL_BASED_OUTPATIENT_CLINIC_OR_DEPARTMENT_OTHER): Payer: Self-pay

## 2020-09-27 ENCOUNTER — Other Ambulatory Visit: Payer: Self-pay | Admitting: Cardiology

## 2020-11-10 ENCOUNTER — Ambulatory Visit: Payer: Medicare Other | Admitting: Neurology

## 2020-11-10 ENCOUNTER — Encounter: Payer: Self-pay | Admitting: Neurology

## 2020-11-10 NOTE — Progress Notes (Deleted)
PATIENT: Samuel Jacobs DOB: 02/06/1951  REASON FOR VISIT: follow up HISTORY FROM: patient  HISTORY OF PRESENT ILLNESS: Today 11/10/20  HISTORY  HISTORY OF PRESENT ILLNESS: HISTORY 08/21/15 (Per Dr. Krista Blue notes): Mr. Diehl is a 70 year old male with a history of complex partial seizure seizure He continue have recurrent seizures while taking Depakote, eventually Vimpat was added on in 2015, Depakote was weaned off, while taking Vimpat 150 mg twice a day, he denies any seizure event. 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 He complains of dizziness, feeling funny with higher dose of Vimpat Previous sleep study in 2011 showed mild obstructive sleep apnea, CPAP was not recommended at that time. He has quit tramadol, he is now taking hydrocodone for low back pain. No longer has frequent headaches, was to go back on lower dose of Vimpat  Last seizure was in 2015, his seizure consistent of staring, confusion. He has difficulty falling to sleep due to his shift job, tried over-the-counter Tylenol PM without benefit, he also complains of depression anxiety  Update February 21 2017: He is taking Vimpat 150 twice a day, there was no recurrent seizure activity, he is driving, trazodone 673 mg for sleep,  UPDATE Apr 26 2018: He is doing very well with Vimpat 150 mg twice a day, there was no recurrent seizure, continue trazodone.  I reviewed the laboratory from [facility] in July 2019, normal CMP creatinine of 1.0, hemoglobin 15.2,  Update November 08, 2019 SS: He continues to do very well on Vimpat 150 mg twice a day.  Last seizure was in 2015.   He continues on trazodone 100 mg at bedtime for sleep.  His PCP is filling the trazodone.  He is tolerating Vimpat, his overall health has been good.  He works full-time, in [employer].  His PCP prefers he be seen here on an annual basis for seizures.  No new problems or concerns.  Update Nov 10, 2020 SS:   REVIEW OF SYSTEMS: Out of a complete 14 system review of symptoms, the patient complains only of the following symptoms, and all other reviewed systems are negative.  Seizures  ALLERGIES: Allergies  Allergen Reactions  . Elemental Sulfur Nausea Only    HOME MEDICATIONS: Outpatient Medications Prior to Visit  Medication Sig Dispense Refill  . albuterol (PROVENTIL HFA;VENTOLIN HFA) 108 (90 Base) MCG/ACT inhaler Inhale 2 puffs into the lungs every 4 (four) hours as needed for wheezing or shortness of breath. (Patient not taking: No sig reported) 1 Inhaler 5  . APIXABAN (ELIQUIS) VTE STARTER PACK (10MG  AND 5MG ) Take as directed on package: start with two-5mg  tablets twice daily for 7 days. On day 8, switch to one-5mg  tablet twice daily to continue for 3 months 1 each 2  . APIXABAN (ELIQUIS) VTE STARTER PACK (10MG  AND 5MG ) TAKE AS DIRECTED ON PACKAGE: START WITH TWO 5 MG TABS TWICE DAILY FOR 7 DAYS. ON DAY 8, SWITCH TO ONE 5MG  TABS TWICE DAILY TO CONTIUNE 3 MONTHS 74 tablet 0  . atorvastatin (LIPITOR) 10 MG tablet TAKE 1 TABLET BY MOUTH EVERY DAY (Patient taking differently: Take 10 mg by mouth daily.) 90 tablet 0  .  hydrochlorothiazide (HYDRODIURIL) 12.5 MG tablet Take 1 tablet (12.5 mg total) by mouth daily.    Marland Kitchen HYDROcodone-acetaminophen (NORCO/VICODIN) 5-325 MG tablet Take 1 tablet by mouth daily.  0  . irbesartan (AVAPRO) 300 MG tablet Take 300 mg by mouth daily.  6  . loratadine (CLARITIN) 10 MG tablet Take 10 mg by mouth daily.    . meloxicam (MOBIC) 15 MG tablet TAKE 1 TABLET BY MOUTH EVERY DAY (Patient taking differently: Take 15 mg by mouth daily.) 30 tablet 0  .  metoprolol succinate (TOPROL-XL) 25 MG 24 hr tablet Take 25 mg by mouth daily.    . pantoprazole (PROTONIX) 40 MG tablet Take 40 mg by mouth 2 (two) times daily.  4  . rifampin (RIFADIN) 300 MG capsule Take 2 capsules (600 mg total) by mouth daily. (Patient not taking: No sig reported) 60 capsule 3  . traZODone (DESYREL) 100 MG tablet Take one tablet daily at bedtime. Please call [phone] to schedule an appt or request refills from PCP. (Patient taking differently: Take 100 mg by mouth at bedtime.) 90 tablet 0  . VIMPAT 150 MG TABS TAKE 1 TABLET BY MOUTH TWICE A DAY (Patient taking differently: Take 150 mg by mouth 2 (two) times daily.) 180 tablet 1   No facility-administered medications prior to visit.    PAST MEDICAL HISTORY: Past Medical History:  Diagnosis Date  . Allergic rhinitis   . Allergy   . Anxiety   . Arthritis   . Carpal tunnel syndrome    09-12-15 no longer a problem.  . Colon polyps   . Complication of anesthesia    one time awaken- not with last procedure done.  . Depression   . ED (erectile dysfunction)   . GERD (gastroesophageal reflux disease)   . HLD (hyperlipidemia)   . Hypertension   . Insomnia   . Seizure ([place])    08-13-15 per pt his last seizure was "about 1 year ago".  maw  . Sleep apnea    pt does not wear c-pap    PAST SURGICAL HISTORY: Past Surgical History:  Procedure Laterality Date  . CHOLECYSTECTOMY  11/07/2015  . COLONOSCOPY    . EUS N/A 09/18/2015   Procedure: UPPER ENDOSCOPIC ULTRASOUND (EUS) LINEAR;  Surgeon: Milus Banister, MD;  Location: [facility] ENDOSCOPY;  Service: Endoscopy;  Laterality: N/A;  . TONSILLECTOMY    . 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  Marland Kitchen UPPER GASTROINTESTINAL ENDOSCOPY    . VIDEO BRONCHOSCOPY Bilateral 07/27/2018   Procedure: VIDEO BRONCHOSCOPY WITHOUT FLUORO;  Surgeon: Collene Gobble, MD;  Location: [facility] END[facility]COPY;  Service: Cardiopulmonary;  Laterality: Bilateral;  . WISDOM TOOTH EXTRACTION       FAMILY HISTORY: Family History  Problem Relation Age of Onset  . Breast cancer Mother        mets to liver  . Stomach cancer Father   . Epilepsy Sister   . Cancer Maternal Grandmother        type unknown  . Cancer Maternal Grandfather        type unknown  . Cancer Paternal Grandfather        type unknown  . Cancer Paternal Grandmother        type unknown  . Colon cancer Neg Hx   . Esophageal cancer Neg Hx   . Rectal cancer Neg Hx   . Prostate cancer Neg Hx     SOCIAL HISTORY: Social History   Socioeconomic History  . Marital  status: Married    Spouse name: Gailen Shelter  . Number of children: 3  . Years of education: college  . Highest education level: Not on file  Occupational History  . Occupation: semi-retired  Tobacco Use  . Smoking status: Never Smoker  . Smokeless tobacco: Never Used  Substance and Sexual Activity  . Alcohol use: No    Alcohol/week: 0.0 standard drinks    Comment: past -none in 7-8 yrs social only  . Drug use: No  . Sexual activity: Not on file  Other Topics Concern  . Not on file  Social History Narrative   Patient lives at home with wife Neoma Laming.    Patient has retired and works part time at [employer]   Patient has 3 children.    Patient has a college education.   Patient is right handed.   Social Determinants of Health   Financial Resource Strain: Not on file  Food Insecurity: Not on file  Transportation Needs: Not on file  Physical Activity: Not on file  Stress: Not on file  Social Connections: Not on file  Intimate Partner Violence: Not on file   PHYSICAL EXAM  There were no vitals filed for this visit. There is no height or weight on file to calculate BMI.  Generalized: Well developed, in no acute distress   Neurological examination  Mentation: Alert oriented to time, place, history taking. Follows all commands speech and language fluent Cranial nerve II-XII: Pupils were equal round reactive to light. Extraocular  movements were full, visual field were full on confrontational test. Facial sensation and strength were normal. Head turning and shoulder shrug  were normal and symmetric. Motor: The motor testing reveals 5 over 5 strength of all 4 extremities. Good symmetric motor tone is noted throughout.  Sensory: Sensory testing is intact to soft touch on all 4 extremities. No evidence of extinction is noted.  Coordination: Cerebellar testing reveals good finger-nose-finger and heel-to-shin bilaterally.  Gait and station: Gait is normal. Tandem gait is normal.  No drift is seen.  Reflexes: Deep tendon reflexes are symmetric and normal bilaterally.   DIAGNOSTIC DATA (LABS, IMAGING, TESTING) - I reviewed patient records, labs, notes, testing and imaging myself where available.  Lab Results  Component Value Date   WBC 10.0 09/03/2020   HGB 14.5 09/03/2020   HCT 41.1 09/03/2020   MCV 87.6 09/03/2020   PLT 234 09/03/2020      Component Value Date/Time   NA 138 09/03/2020 0225   NA 134 05/02/2014 1139   K 4.1 09/03/2020 0225   CL 103 09/03/2020 0225   CO2 24 09/03/2020 0225   GLUCOSE 122 (H) 09/03/2020 0225   BUN 17 09/03/2020 0225   BUN 16 05/02/2014 1139   CREATININE 0.99 09/03/2020 0225   CALCIUM 9.0 09/03/2020 0225   PROT 6.3 (L) 09/03/2020 0225   PROT 7.3 08/23/2016 0836   ALBUMIN 3.5 09/03/2020 0225   ALBUMIN 4.4 08/23/2016 0836   AST 20 09/03/2020 0225   ALT 25 09/03/2020 0225   ALKPHOS 46 09/03/2020 0225   BILITOT 0.7 09/03/2020 0225   BILITOT 0.3 08/23/2016 0836   GFRNONAA >60 09/03/2020 0225   GFRAA >60 08/16/2018 0500   No results found for: CHOL, HDL, LDLCALC, LDLDIRECT, TRIG, CHOLHDL Lab Results  Component Value Date   HGBA1C 6.6 (H) 09/02/2020   No results found for: [id number] Lab Results  Component Value Date   TSH 0.877 09/03/2020      ASSESSMENT AND PLAN 70  y.o. year old male  has a past medical history of Allergic rhinitis, Allergy, Anxiety, Arthritis, Carpal  tunnel syndrome, Colon polyps, Complication of anesthesia, Depression, ED (erectile dysfunction), GERD (gastroesophageal reflux disease), HLD (hyperlipidemia), Hypertension, Insomnia, Seizure ([place]), and Sleep apnea. here with:  1.  Epilepsy -Last seizure was in 2015 -Continue Vimpat 150 mg twice a day, refill sent on 11/06/2019 for 3 months, will fill going forward for him -Call for recurrent seizure, otherwise follow-up in 1 year or sooner if needed  2.  Chronic insomnia -Continue trazodone, coming from PCP  I spent 20 minutes of face-to-face and non-face-to-face time with patient.  This included previsit chart review, lab review, study review, order entry, electronic health record documentation, patient education.  Butler Denmark, AGNP-C, DNP 11/10/2020, 5:26 AM [facility] [address] [place], [place] [address] [phone]

## 2020-11-27 ENCOUNTER — Encounter: Payer: Self-pay | Admitting: Cardiology

## 2020-11-27 ENCOUNTER — Other Ambulatory Visit: Payer: Self-pay

## 2020-11-27 ENCOUNTER — Ambulatory Visit: Payer: Managed Care, Other (non HMO) | Admitting: Cardiology

## 2020-11-27 VITALS — BP 150/77 | HR 70 | Temp 98.6°F | Resp 16 | Ht 71.0 in | Wt 261.8 lb

## 2020-11-27 DIAGNOSIS — I10 Essential (primary) hypertension: Secondary | ICD-10-CM

## 2020-11-27 DIAGNOSIS — R55 Syncope and collapse: Secondary | ICD-10-CM

## 2020-11-27 DIAGNOSIS — I209 Angina pectoris, unspecified: Secondary | ICD-10-CM

## 2020-11-27 DIAGNOSIS — I452 Bifascicular block: Secondary | ICD-10-CM

## 2020-11-27 DIAGNOSIS — G4733 Obstructive sleep apnea (adult) (pediatric): Secondary | ICD-10-CM

## 2020-11-27 DIAGNOSIS — R0600 Dyspnea, unspecified: Secondary | ICD-10-CM

## 2020-11-27 DIAGNOSIS — R0609 Other forms of dyspnea: Secondary | ICD-10-CM

## 2020-11-27 MED ORDER — ASPIRIN 81 MG PO CHEW: 81.0000 mg | CHEWABLE_TABLET | Freq: Every day | ORAL | Status: DC

## 2020-11-27 MED ORDER — METOPROLOL SUCCINATE ER 50 MG PO TB24: 50.0000 mg | ORAL_TABLET | Freq: Every day | ORAL | 3 refills | Status: AC

## 2020-11-27 NOTE — Progress Notes (Signed)
Primary Physician/Referring:  Prince Solian, MD  Patient ID: Samuel Jacobs, male    DOB: 23-Oct-1950, 70 y.o.   MRN: [id number]  Chief Complaint  Patient presents with  . irregular heartbeat  . Fatigue  . Follow-up   HPI:    Samuel Jacobs  is a 70 y.o. patient male patient with hypertension, hyperlipidemia, prediabetes mellitus, obesity, remote diagnosis of obstructive sleep apnea presented to reestablish care, had seen him greater than 4 to 5 years ago.  He self-referred himself.  Patient symptoms of worsening dyspnea on exertion for the past 5 to 6 months even doing minimal activity, associated with episodes of chest tightness in the center of the chest.  Patient symptoms have been progressive, even doing minimal activity he gets short of breath.  He also gets markedly dyspneic if he bends down.  Symptoms of chest pain and dyspnea resolved with resting.  He has had 1 episode of frank syncope about a month ago with no premonitory symptoms.  Since then he has noticed episodes of near syncope while doing routine activities and also about a week ago while he was sitting watching TV he suddenly felt he was going to pass out.  He has been concerned about the symptoms.  He also complains of marked fatigue and daytime somnolence.  He thinks that he has not had quality sleep although he sleeps for good amount of hours.   Past Medical History:  Diagnosis Date  . Allergic rhinitis   . Allergy   . Anxiety   . Arthritis   . Carpal tunnel syndrome    09-12-15 no longer a problem.  . Colon polyps   . Complication of anesthesia    one time awaken- not with last procedure done.  . Depression   . ED (erectile dysfunction)   . GERD (gastroesophageal reflux disease)   . HLD (hyperlipidemia)   . Hypertension   . Insomnia   . Seizure ([place])    08-13-15 per pt his last seizure was "about 1 year ago".  maw  . Sleep apnea    pt does not wear c-pap   Past Surgical History:  Procedure Laterality Date  .  CHOLECYSTECTOMY  11/07/2015  . COLONOSCOPY    . EUS N/A 09/18/2015   Procedure: UPPER ENDOSCOPIC ULTRASOUND (EUS) LINEAR;  Surgeon: Milus Banister, MD;  Location: [facility] ENDOSCOPY;  Service: Endoscopy;  Laterality: N/A;  . TONSILLECTOMY    . 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  Marland Kitchen UPPER GASTROINTESTINAL ENDOSCOPY    . VIDEO BRONCHOSCOPY Bilateral 07/27/2018   Procedure: VIDEO BRONCHOSCOPY WITHOUT FLUORO;  Surgeon: Collene Gobble, MD;  Location: [facility] END[facility]COPY;  Service: Cardiopulmonary;  Laterality: Bilateral;  . WISDOM TOOTH EXTRACTION     Family History  Problem Relation Age of Onset  . Breast cancer Mother        mets to liver  . Stomach cancer Father   . Epilepsy Sister   . Cancer Maternal Grandmother        type unknown  . Cancer Maternal Grandfather        type unknown  . Cancer Paternal Grandfather        type unknown  . Cancer Paternal Grandmother        type unknown  . Colon cancer Neg Hx   . Esophageal cancer Neg Hx   . Rectal cancer Neg Hx   . Prostate cancer Neg Hx     Social History   Tobacco  Use  . Smoking status: Never Smoker  . Smokeless tobacco: Never Used  Substance Use Topics  . Alcohol use: No    Alcohol/week: 0.0 standard drinks    Comment: past -none in 7-8 yrs social only   Marital Status: Married   ROS  Review of Systems  Constitutional: Positive for malaise/fatigue.  Cardiovascular: Positive for chest pain, dyspnea on exertion and syncope. Negative for leg swelling.  Gastrointestinal: Negative for melena.   Objective  Blood pressure (!) 150/77, pulse 70, temperature 98.6 F (37 C), temperature source Temporal, resp. rate 16, height _0  (1.803 m), weight 261 lb 12.8 oz (118.8 kg), SpO2 97 %. Body mass index is 36.51 kg/m.  Vitals with BMI 11/27/2020 09/03/2020 09/03/2020  Height _1  - -  Weight 261 lbs 13 oz - -  BMI 54.09 - -  Systolic 811 914 -  Diastolic 77 65 -  Pulse 70 57 63     Physical  Exam Constitutional:      Appearance: He is obese.  Neck:     Vascular: No carotid bruit or JVD.  Cardiovascular:     Rate and Rhythm: Normal rate and regular rhythm.     Pulses: Normal pulses and intact distal pulses.     Heart sounds: Normal heart sounds. No murmur heard. No gallop.   Pulmonary:     Effort: Pulmonary effort is normal.     Breath sounds: Normal breath sounds.  Abdominal:     General: Bowel sounds are normal.     Palpations: Abdomen is soft.  Musculoskeletal:        General: No swelling.      Laboratory examination:   Recent Labs    09/02/20 1116 09/03/20 0225  NA 136 138  K 4.5 4.1  CL 100 103  CO2 27 24  GLUCOSE 177* 122*  BUN 22 17  CREATININE 1.17 0.99  CALCIUM 9.4 9.0  GFRNONAA >60 >60   CrCl cannot be calculated (Patient's most recent lab result is older than the maximum 21 days allowed.).  CMP Latest Ref Rng & Units 09/03/2020 09/02/2020 08/16/2018  Glucose 70 - 99 mg/dL 122(H) 177(H) 146(H)  BUN 8 - 23 mg/dL _2 Creatinine 0.61 - 1.24 mg/dL 0.99 1.17 1.10  Sodium 135 - 145 mmol/L 138 136 139  Potassium 3.5 - 5.1 mmol/L 4.1 4.5 4.1  Chloride 98 - 111 mmol/L 103 100 105  CO2 22 - 32 mmol/L _3 Calcium 8.9 - 10.3 mg/dL 9.0 9.4 9.6  Total Protein 6.5 - 8.1 g/dL 6.3(L) - -  Total Bilirubin 0.3 - 1.2 mg/dL 0.7 - -  Alkaline Phos 38 - 126 U/L 46 - -  AST 15 - 41 U/L 20 - -  ALT 0 - 44 U/L 25 - -   CBC Latest Ref Rng & Units 09/03/2020 09/02/2020 08/16/2018  WBC 4.0 - 10.5 K/uL 10.0 10.6(H) 8.3  Hemoglobin 13.0 - 17.0 g/dL 14.5 15.4 15.8  Hematocrit 39.0 - 52.0 % 41.1 44.0 47.8  Platelets 150 - 400 K/uL 234 265 290   Lipid Panel No results for input(s): CHOL, TRIG, LDLCALC, VLDL, HDL, CHOLHDL, LDLDIRECT in the last 8760 hours. Lipid Panel  No results found for: CHOL, TRIG, HDL, CHOLHDL, VLDL, LDLCALC, LDLDIRECT, LABVLDL   HEMOGLOBIN A1C Lab Results  Component Value Date   HGBA1C 6.6 (H) 09/02/2020   MPG 142.72 09/02/2020    TSH Recent Labs    09/03/20 0225  TSH 0.877  External labs:   Labs 09/08/2020:  Serum glucose 133 mg, BUN 19, creatinine 1.1, EGFR 66 mL, potassium 4.9, CMP normal.  Hb 15.3/HCT 43.1, platelets 258, normal indicis.  Labs 05/28/2020:  Total cholesterol 129, triglycerides 116, HDL 35, LDL 71.  Non-HDL cholesterol 94.  TSH normal at 1.30, A1c 6.3%.  Medications and allergies   Allergies  Allergen Reactions  . Elemental Sulfur Nausea Only      Current Outpatient Medications:  .  aspirin (ASPIRIN CHILDRENS) 81 MG chewable tablet, Chew 1 tablet (81 mg total) by mouth daily., Disp: , Rfl:  .  atorvastatin (LIPITOR) 10 MG tablet, TAKE 1 TABLET BY MOUTH EVERY DAY (Patient taking differently: Take 10 mg by mouth daily.), Disp: 90 tablet, Rfl: 0 .  hydrochlorothiazide (HYDRODIURIL) 12.5 MG tablet, Take 1 tablet (12.5 mg total) by mouth daily., Disp: , Rfl:  .  HYDROcodone-acetaminophen (NORCO/VICODIN) 5-325 MG tablet, Take 1 tablet by mouth daily., Disp: , Rfl: 0 .  irbesartan (AVAPRO) 300 MG tablet, Take 300 mg by mouth daily., Disp: , Rfl: 6 .  loratadine (CLARITIN) 10 MG tablet, Take 10 mg by mouth daily., Disp: , Rfl:  .  meloxicam (MOBIC) 15 MG tablet, TAKE 1 TABLET BY MOUTH EVERY DAY (Patient taking differently: Take 15 mg by mouth daily.), Disp: 30 tablet, Rfl: 0 .  metoprolol succinate (TOPROL-XL) 50 MG 24 hr tablet, Take 1 tablet (50 mg total) by mouth daily. Take with or immediately following a meal., Disp: 90 tablet, Rfl: 3 .  pantoprazole (PROTONIX) 40 MG tablet, Take 40 mg by mouth 2 (two) times daily., Disp: , Rfl: 4 .  sildenafil (VIAGRA) 100 MG tablet, Take 1 tablet by mouth as needed., Disp: , Rfl:  .  traZODone (DESYREL) 100 MG tablet, Take one tablet daily at bedtime. Please call [phone] to schedule an appt or request refills from PCP. (Patient taking differently: Take 100 mg by mouth at bedtime.), Disp: 90 tablet, Rfl: 0 .  VIMPAT 150 MG TABS, TAKE 1 TABLET BY  MOUTH TWICE A DAY (Patient taking differently: Take 150 mg by mouth 2 (two) times daily.), Disp: 180 tablet, Rfl: 1   Radiology:   No results found.  Cardiac Studies:   Echocardiogram 02/22/2018:  1. Left ventricle cavity is normal in size. Moderate concentric hypertrophy of the left ventricle. Normal global wall motion. Doppler evidence of grade I (impaired) diastolic dysfunction, normal LAP. Calculated EF 57%. 2. Left atrial cavity is mildly dilated. 3. Mild (Grade I) mitral regurgitation. 4. Mild tricuspid regurgitation. Estimated pulmonary artery systolic pressure 23 mmHg EKG:     EKG 11/27/2020: Normal sinus rhythm at rate of 70 bpm, left atrial abnormality, left axis deviation, left intrafascicular block.  Right bundle branch block.  Bifascicular block.  LVH by voltage criteria.     Assessment     ICD-10-CM   1. Dyspnea on exertion  R06.00 EKG 12-Lead    [facility] MYOCARDIAL PERFUSION WO LEXISCAN    [facility] ECHOCARDIOGRAM COMPLETE  2. Angina pectoris (HCC)  I20.9 metoprolol succinate (TOPROL-XL) 50 MG 24 hr tablet    aspirin (ASPIRIN CHILDRENS) 81 MG chewable tablet    [facility] MYOCARDIAL PERFUSION WO LEXISCAN    [facility] ECHOCARDIOGRAM COMPLETE  3. Syncope and collapse  R55 LONG TERM MONITOR-LIVE TELEMETRY (3-14 DAYS)  4. Bifascicular block  I45.2 LONG TERM MONITOR-LIVE TELEMETRY (3-14 DAYS)  5. Obstructive sleep apnea  G47.33 Ambulatory referral to Sleep Studies  6. Primary hypertension  I10 metoprolol succinate (TOPROL-XL) 50 MG 24  hr tablet    Meds ordered this encounter  Medications  . metoprolol succinate (TOPROL-XL) 50 MG 24 hr tablet    Sig: Take 1 tablet (50 mg total) by mouth daily. Take with or immediately following a meal.    Dispense:  90 tablet    Refill:  3  . aspirin (ASPIRIN CHILDRENS) 81 MG chewable tablet    Sig: Chew 1 tablet (81 mg total) by mouth daily.     Medications Discontinued During This Encounter  Medication Reason  . albuterol (PROVENTIL HFA;VENTOLIN  HFA) 108 (90 Base) MCG/ACT inhaler Error  . APIXABAN (ELIQUIS) VTE STARTER PACK (10MG AND 5MG) Error  . APIXABAN (ELIQUIS) VTE STARTER PACK (10MG AND 5MG) Error  . rifampin (RIFADIN) 300 MG capsule Error  . metoprolol succinate (TOPROL-XL) 25 MG 24 hr tablet Dose change    Orders Placed This Encounter  Procedures  . Ambulatory referral to Sleep Studies    Referral Priority:   Routine    Referral Type:   Consultation    Referral Reason:   Specialty Services Required    Referred to Provider:   Larey Seat, MD    Number of Visits Requested:   1  . [facility] MYOCARDIAL PERFUSION WO LEXISCAN    Standing Status:   Future    Standing Expiration Date:   01/27/2021  . LONG TERM MONITOR-LIVE TELEMETRY (3-14 DAYS)    Standing Status:   Future    Standing Expiration Date:   11/27/2021    Order Specific Question:   Where should this test be performed?    Answer:   [facility]    Order Specific Question:   Does the patient have an implanted cardiac device?    Answer:   No    Order Specific Question:   Prescribed days of wear    Answer:   17    Order Specific Question:   Emergency contact    Answer:   Engh,Deborah    Order Specific Question:   Emergency contact mobile    Answer:   [phone]    Order Specific Question:   Emergency contact home    Answer:   [phone]  . EKG 12-Lead  . [facility] ECHOCARDIOGRAM COMPLETE    Standing Status:   Future    Standing Expiration Date:   11/27/2021   Recommendations:   Samuel Jacobs is a 70 y.o. patient male patient with hypertension, hyperlipidemia, prediabetes mellitus, obesity, remote diagnosis of obstructive sleep apnea presented to reestablish care, had seen him greater than 4 to 5 years ago.  He self-referred himself.  Patient symptoms of worsening dyspnea on exertion for the past 5 to 6 months even doing minimal activity, associated with episodes of chest tightness in the center of the chest are very suggestive of angina pectoris. Patient  instructed to start ASA  90m q daily for prophylaxis. Schedule for a Exercise Nuclear stress test to evaluate for myocardial ischemia. Will schedule for an echocardiogram.   I will increase his metoprolol succinate from 231mto 50 mg daily both for hypertension and for angina pectoris.  Although he has had syncope and bifascicular block, I increase his metoprolol as I am also concerned about symptoms of angina.  I reviewed his external labs, lipids under good control.  Continue statins.  He has also had unexplained syncope, about a month ago he passed out in his house while walking the hallway and again 2 weeks ago while sitting on the sofa he had sudden near syncope that  lasted a few seconds.  He has underlying bifascicular block.  Conduction system need to be excluded.  I will perform outpatient extended EKG monitoring live to evaluate for specifically heart block.  He has been diagnosed with sleep apnea in the past, he has severe daytime somnolence, fatigue and has obesity also has risk factor, will refer him to be evaluated by Dr. Asencion Partridge Dohmeier for the same.  I would like to see him back in 4 to 6 weeks for follow-up unless symptoms are getting worse.  His calculated CV risk is >20%.  This includes hypertension, hyperlipidemia, hyperglycemia, obesity, age and male sex.  60-minute office visit encounter with evaluation of external records, labs, coordination of care.    Adrian Prows, MD, [facility] 11/27/2020, 5:10 PM Office: [phone]

## 2020-11-27 NOTE — Progress Notes (Deleted)
Primary Physician/Referring:  Prince Solian, MD  Patient ID: Samuel Jacobs, male    DOB: 01/21/1951, 70 y.o.   MRN: [id number]  Chief Complaint  Patient presents with  . irregular heartbeat  . Fatigue  . Follow-up   HPI:    Samuel Jacobs  is a 70 y.o. ***  Past Medical History:  Diagnosis Date  . Allergic rhinitis   . Allergy   . Anxiety   . Arthritis   . Carpal tunnel syndrome    09-12-15 no longer a problem.  . Colon polyps   . Complication of anesthesia    one time awaken- not with last procedure done.  . Depression   . ED (erectile dysfunction)   . GERD (gastroesophageal reflux disease)   . HLD (hyperlipidemia)   . Hypertension   . Insomnia   . Seizure ([place])    08-13-15 per pt his last seizure was "about 1 year ago".  maw  . Sleep apnea    pt does not wear c-pap   Past Surgical History:  Procedure Laterality Date  . CHOLECYSTECTOMY  11/07/2015  . COLONOSCOPY    . EUS N/A 09/18/2015   Procedure: UPPER ENDOSCOPIC ULTRASOUND (EUS) LINEAR;  Surgeon: Milus Banister, MD;  Location: [facility];  Service: Endoscopy;  Laterality: N/A;  . TONSILLECTOMY    . 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  Marland Kitchen UPPER GASTROINTESTINAL ENDOSCOPY    . VIDEO BRONCHOSCOPY Bilateral 07/27/2018   Procedure: VIDEO BRONCHOSCOPY WITHOUT FLUORO;  Surgeon: Collene Gobble, MD;  Location: [facility] ENDOSCOPY;  Service: Cardiopulmonary;  Laterality: Bilateral;  . WISDOM TOOTH EXTRACTION     Family History  Problem Relation Age of Onset  . Breast cancer Mother        mets to liver  . Stomach cancer Father   . Epilepsy Sister   . Cancer Maternal Grandmother        type unknown  . Cancer Maternal Grandfather        type unknown  . Cancer Paternal Grandfather        type unknown  . Cancer Paternal Grandmother        type unknown  . Colon cancer Neg Hx   . Esophageal cancer Neg Hx   . Rectal cancer Neg Hx   . Prostate cancer Neg Hx     Social History   Tobacco Use  .  Smoking status: Never Smoker  . Smokeless tobacco: Never Used  Substance Use Topics  . Alcohol use: No    Alcohol/week: 0.0 standard drinks    Comment: past -none in 7-8 yrs social only   Marital Status: Married  ROS  ***ROS Objective  Blood pressure (!) 150/77, pulse 70, temperature 98.6 F (37 C), temperature source Temporal, resp. rate 16, height _0  (1.803 m), weight 261 lb 12.8 oz (118.8 kg), SpO2 97 %. Body mass index is 36.51 kg/m.  Vitals with BMI 11/27/2020 09/03/2020 09/03/2020  Height _1  - -  Weight 261 lbs 13 oz - -  BMI 37.36 - -  Systolic 681 594 -  Diastolic 77 65 -  Pulse 70 57 63     ***Physical Exam   Laboratory examination:   Recent Labs    09/02/20 1116 09/03/20 0225  NA 136 138  K 4.5 4.1  CL 100 103  CO2 27 24  GLUCOSE 177* 122*  BUN 22 17  CREATININE 1.17 0.99  CALCIUM 9.4 9.0  GFRNONAA >60 >60  CrCl cannot be calculated (Patient's most recent lab result is older than the maximum 21 days allowed.).  CMP Latest Ref Rng & Units 09/03/2020 09/02/2020 08/16/2018  Glucose 70 - 99 mg/dL 122(H) 177(H) 146(H)  BUN 8 - 23 mg/dL _0 Creatinine 0.61 - 1.24 mg/dL 0.99 1.17 1.10  Sodium 135 - 145 mmol/L 138 136 139  Potassium 3.5 - 5.1 mmol/L 4.1 4.5 4.1  Chloride 98 - 111 mmol/L 103 100 105  CO2 22 - 32 mmol/L _1 Calcium 8.9 - 10.3 mg/dL 9.0 9.4 9.6  Total Protein 6.5 - 8.1 g/dL 6.3(L) - -  Total Bilirubin 0.3 - 1.2 mg/dL 0.7 - -  Alkaline Phos 38 - 126 U/L 46 - -  AST 15 - 41 U/L 20 - -  ALT 0 - 44 U/L 25 - -   CBC Latest Ref Rng & Units 09/03/2020 09/02/2020 08/16/2018  WBC 4.0 - 10.5 K/uL 10.0 10.6(H) 8.3  Hemoglobin 13.0 - 17.0 g/dL 14.5 15.4 15.8  Hematocrit 39.0 - 52.0 % 41.1 44.0 47.8  Platelets 150 - 400 K/uL 234 265 290    Lipid Panel No results for input(s): CHOL, TRIG, LDLCALC, VLDL, HDL, CHOLHDL, LDLDIRECT in the last 8760 hours. Lipid Panel  No results found for: CHOL, TRIG, HDL, CHOLHDL, VLDL, LDLCALC, LDLDIRECT,  LABVLDL   HEMOGLOBIN A1C Lab Results  Component Value Date   HGBA1C 6.6 (H) 09/02/2020   MPG 142.72 09/02/2020   TSH Recent Labs    09/03/20 0225  TSH 0.877    External labs:   Labs 09/08/2020:  Serum glucose 133 mg, BUN 19, creatinine 1.1, EGFR 66 mL, potassium 4.9, CMP otherwise normal.  Hb 15.3/HCT 43.1, platelets 258.  Labs 05/28/2020:  Total cholesterol 149, triglycerides 116, HDL 35, LDL 71.  Non-HDL cholesterol 94.  TSH normal at 1.30.  A1c 6.3%.  Medications and allergies   Allergies  Allergen Reactions  . Elemental Sulfur Nausea Only      Current Outpatient Medications:  .  atorvastatin (LIPITOR) 10 MG tablet, TAKE 1 TABLET BY MOUTH EVERY DAY (Patient taking differently: Take 10 mg by mouth daily.), Disp: 90 tablet, Rfl: 0 .  hydrochlorothiazide (HYDRODIURIL) 12.5 MG tablet, Take 1 tablet (12.5 mg total) by mouth daily., Disp: , Rfl:  .  HYDROcodone-acetaminophen (NORCO/VICODIN) 5-325 MG tablet, Take 1 tablet by mouth daily., Disp: , Rfl: 0 .  irbesartan (AVAPRO) 300 MG tablet, Take 300 mg by mouth daily., Disp: , Rfl: 6 .  loratadine (CLARITIN) 10 MG tablet, Take 10 mg by mouth daily., Disp: , Rfl:  .  meloxicam (MOBIC) 15 MG tablet, TAKE 1 TABLET BY MOUTH EVERY DAY (Patient taking differently: Take 15 mg by mouth daily.), Disp: 30 tablet, Rfl: 0 .  metoprolol succinate (TOPROL-XL) 25 MG 24 hr tablet, Take 25 mg by mouth daily., Disp: , Rfl:  .  pantoprazole (PROTONIX) 40 MG tablet, Take 40 mg by mouth 2 (two) times daily., Disp: , Rfl: 4 .  sildenafil (VIAGRA) 100 MG tablet, Take 1 tablet by mouth as needed., Disp: , Rfl:  .  traZODone (DESYREL) 100 MG tablet, Take one tablet daily at bedtime. Please call [phone] to schedule an appt or request refills from PCP. (Patient taking differently: Take 100 mg by mouth at bedtime.), Disp: 90 tablet, Rfl: 0 .  VIMPAT 150 MG TABS, TAKE 1 TABLET BY MOUTH TWICE A DAY (Patient taking differently: Take 150 mg by mouth 2  (two) times daily.), Disp:  180 tablet, Rfl: 1   Radiology:   No results found.  Cardiac Studies:   *** EKG:     EKG 11/27/2020: Normal sinus rhythm at rate of 70 bpm, left atrial abnormality, left axis deviation, left intrafascicular block.  Right bundle branch block.  Bifascicular block.  LVH by voltage criteria.   Assessment     ICD-10-CM   1. Palpitations  R00.2 EKG 12-Lead     Medications Discontinued During This Encounter  Medication Reason  . albuterol (PROVENTIL HFA;VENTOLIN HFA) 108 (90 Base) MCG/ACT inhaler Error  . APIXABAN (ELIQUIS) VTE STARTER PACK (10MG AND 5MG) Error  . APIXABAN (ELIQUIS) VTE STARTER PACK (10MG AND 5MG) Error  . rifampin (RIFADIN) 300 MG capsule Error    No orders of the defined types were placed in this encounter.  Orders Placed This Encounter  Procedures  . EKG 12-Lead   Recommendations:   Samuel Jacobs is a 70 y.o. ***    Adrian Prows, MD, [facility] 11/27/2020, 4:29 PM Office: [phone]

## 2020-12-22 ENCOUNTER — Other Ambulatory Visit: Payer: Managed Care, Other (non HMO)

## 2021-01-07 ENCOUNTER — Ambulatory Visit: Payer: Managed Care, Other (non HMO)

## 2021-01-07 ENCOUNTER — Other Ambulatory Visit: Payer: Self-pay

## 2021-01-07 DIAGNOSIS — I209 Angina pectoris, unspecified: Secondary | ICD-10-CM

## 2021-01-07 DIAGNOSIS — R0609 Other forms of dyspnea: Secondary | ICD-10-CM

## 2021-01-09 ENCOUNTER — Other Ambulatory Visit: Payer: Self-pay

## 2021-01-09 ENCOUNTER — Inpatient Hospital Stay: Payer: Managed Care, Other (non HMO)

## 2021-01-09 ENCOUNTER — Ambulatory Visit: Payer: Managed Care, Other (non HMO)

## 2021-01-09 DIAGNOSIS — I452 Bifascicular block: Secondary | ICD-10-CM

## 2021-01-09 DIAGNOSIS — R0609 Other forms of dyspnea: Secondary | ICD-10-CM

## 2021-01-09 DIAGNOSIS — I209 Angina pectoris, unspecified: Secondary | ICD-10-CM

## 2021-01-09 DIAGNOSIS — R55 Syncope and collapse: Secondary | ICD-10-CM

## 2021-01-13 NOTE — Progress Notes (Signed)
Echocardiogram 01/09/2021: Left ventricle cavity is normal in size. Moderate concentric hypertrophy of the left ventricle. Normal global wall motion. Normal LV systolic function with EF 66%. Doppler evidence of grade II (pseudonormal) diastolic dysfunction, elevated LAP. Left atrial cavity is mildly dilated. Aneurysmal interatrial septum without 2D or color Doppler evidence of shunting. Right atrial cavity is mildly dilated. Mild (Grade I) mitral regurgitation. Moderate tricuspid regurgitation. Estimated pulmonary artery systolic pressure 39 mmHg. Previous study in 2019 noted grade 1 DD, mild MR, mild TR, estimated PASP 23 mmHg.

## 2021-01-13 NOTE — Progress Notes (Signed)
Lexiscan (Walking with Jaci Carrel) Sestamibi Stress Test 01/07/2021: Nondiagnostic ECG stress due to walking pharmacologic stress. Achieved 7.05 METS on Mod Bruce protocol without EKG changes. Mild dyspnea and dizziness. Myocardial perfusion is normal. Overall LV systolic function is normal without regional wall motion abnormalities. Stress LV EF: 62%. No previous exam available for comparison. Low risk.

## 2021-01-23 ENCOUNTER — Ambulatory Visit: Payer: Managed Care, Other (non HMO) | Admitting: Cardiology

## 2021-01-23 ENCOUNTER — Encounter: Payer: Self-pay | Admitting: Cardiology

## 2021-01-23 ENCOUNTER — Other Ambulatory Visit: Payer: Self-pay

## 2021-01-23 VITALS — BP 118/74 | HR 54 | Temp 98.7°F | Resp 17 | Ht 71.0 in | Wt 261.0 lb

## 2021-01-23 DIAGNOSIS — I712 Thoracic aortic aneurysm, without rupture: Secondary | ICD-10-CM

## 2021-01-23 DIAGNOSIS — R0609 Other forms of dyspnea: Secondary | ICD-10-CM

## 2021-01-23 DIAGNOSIS — I10 Essential (primary) hypertension: Secondary | ICD-10-CM

## 2021-01-23 DIAGNOSIS — I7121 Aneurysm of the ascending aorta, without rupture: Secondary | ICD-10-CM

## 2021-01-23 DIAGNOSIS — I209 Angina pectoris, unspecified: Secondary | ICD-10-CM

## 2021-01-23 DIAGNOSIS — R0600 Dyspnea, unspecified: Secondary | ICD-10-CM

## 2021-01-23 DIAGNOSIS — I452 Bifascicular block: Secondary | ICD-10-CM

## 2021-01-23 NOTE — Progress Notes (Signed)
Primary Physician/Referring:  Chilton Greathouse, MD  Patient ID: Samuel Jacobs, male    DOB: [birth year], 70 y.o.   MRN: [id number]  No chief complaint on file.  HPI:    Samuel Jacobs  is a 70 y.o. patient male patient with hypertension, hyperlipidemia, prediabetes mellitus, obesity, remote diagnosis of obstructive sleep apnea, GERD, duodenal paraganglioma SP excision in 2016, complex partial seizures, had an episode of syncope when he tried to get up from bed on 09/02/2020 and was seen in the emergency room and CT scan and revealed left occipital blowout fracture and also right lower lobe pulmonary embolism.  Patient had a long ride to [place] few weeks prior to the onset.  I had seen him 6 weeks ago when he presented with worsening dyspnea on exertion over the past 5 to 6 months with minimal activity, also had chest tightness with exertion activity suggestive of angina pectoris.  He was also having episodes of syncope and near syncope. He has noticed episodes of near syncope while doing routine activities and also about a week ago while he was sitting watching TV he suddenly felt he was going to pass out.  He has been concerned about the symptoms.  He also complains of marked fatigue and daytime somnolence.  He has been referred for sleep study to Dr. Vickey Huger.  Past Medical History:  Diagnosis Date   Allergic rhinitis    Allergy    Anxiety    Arthritis    Carpal tunnel syndrome    09-12-15 no longer a problem.   Colon polyps    Complication of anesthesia    one time awaken- not with last procedure done.   Depression    ED (erectile dysfunction)    GERD (gastroesophageal reflux disease)    HLD (hyperlipidemia)    Hypertension    Insomnia    Seizure (HCC)    08-13-15 per pt his last seizure was "about 1 year ago".  maw   Sleep apnea    pt does not wear c-pap   Past Surgical History:  Procedure Laterality Date   CHOLECYSTECTOMY  11/07/2015   COLONOSCOPY     EUS N/A 09/18/2015   Procedure:  UPPER ENDOSCOPIC ULTRASOUND (EUS) LINEAR;  Surgeon: Rachael Fee, MD;  Location: [facility];  Service: Endoscopy;  Laterality: N/A;   TONSILLECTOMY     TUMOR REMOVAL  11/07/2015   Cancerous tumor removed from duodenum.   UPPER GASTROINTESTINAL ENDOSCOPY     VIDEO BRONCHOSCOPY Bilateral 07/27/2018   Procedure: VIDEO BRONCHOSCOPY WITHOUT FLUORO;  Surgeon: Leslye Peer, MD;  Location: [facility];  Service: Cardiopulmonary;  Laterality: Bilateral;   WISDOM TOOTH EXTRACTION     Family History  Problem Relation Age of Onset   Breast cancer Mother        mets to liver   Stomach cancer Father    Epilepsy Sister    Cancer Maternal Grandmother        type unknown   Cancer Maternal Grandfather        type unknown   Cancer Paternal Grandfather        type unknown   Cancer Paternal Grandmother        type unknown   Colon cancer Neg Hx    Esophageal cancer Neg Hx    Rectal cancer Neg Hx    Prostate cancer Neg Hx     Social History   Tobacco Use   Smoking status: Never   Smokeless tobacco: Never  Substance Use  Topics   Alcohol use: No    Alcohol/week: 0.0 standard drinks    Comment: past -none in 7-8 yrs social only   Marital Status: Married   ROS  Review of Systems  Constitutional: Positive for malaise/fatigue.  Cardiovascular:  Positive for dyspnea on exertion and syncope. Negative for chest pain and leg swelling.  Gastrointestinal:  Negative for melena.  Objective  Blood pressure 118/74, pulse (!) 54, temperature 98.7 F (37.1 C), temperature source Temporal, resp. rate 17, height $RemoveBe'5\' 11"'xCrUNGQKx$  (1.803 m), weight 261 lb (118.4 kg), SpO2 95 %. Body mass index is 36.4 kg/m.  Vitals with BMI 01/23/2021 11/27/2020 09/03/2020  Height $Remov'5\' 11"'tNEdXR$  $RemoveB'5\' 11"'PTNtLMWw$  -  Weight 261 lbs 261 lbs 13 oz -  BMI 16.10 96.04 -  Systolic 540 981 191  Diastolic 74 77 65  Pulse 54 70 57     Physical Exam Constitutional:      Appearance: He is obese.  Neck:     Vascular: No carotid bruit or JVD.   Cardiovascular:     Rate and Rhythm: Normal rate and regular rhythm.     Pulses: Normal pulses and intact distal pulses.     Heart sounds: Normal heart sounds. No murmur heard.   No gallop.  Pulmonary:     Effort: Pulmonary effort is normal.     Breath sounds: Normal breath sounds.  Abdominal:     General: Bowel sounds are normal.     Palpations: Abdomen is soft.  Musculoskeletal:        General: No swelling.     Laboratory examination:   Recent Labs    09/02/20 1116 09/03/20 0225  NA 136 138  K 4.5 4.1  CL 100 103  CO2 27 24  GLUCOSE 177* 122*  BUN 22 17  CREATININE 1.17 0.99  CALCIUM 9.4 9.0  GFRNONAA >60 >60   CrCl cannot be calculated (Patient's most recent lab result is older than the maximum 21 days allowed.).  CMP Latest Ref Rng & Units 09/03/2020 09/02/2020 08/16/2018  Glucose 70 - 99 mg/dL 122(H) 177(H) 146(H)  BUN 8 - 23 mg/dL $Remove'17 22 12  'SMpEQlx$ Creatinine 0.61 - 1.24 mg/dL 0.99 1.17 1.10  Sodium 135 - 145 mmol/L 138 136 139  Potassium 3.5 - 5.1 mmol/L 4.1 4.5 4.1  Chloride 98 - 111 mmol/L 103 100 105  CO2 22 - 32 mmol/L $RemoveB'24 27 27  'JqCCnYGx$ Calcium 8.9 - 10.3 mg/dL 9.0 9.4 9.6  Total Protein 6.5 - 8.1 g/dL 6.3(L) - -  Total Bilirubin 0.3 - 1.2 mg/dL 0.7 - -  Alkaline Phos 38 - 126 U/L 46 - -  AST 15 - 41 U/L 20 - -  ALT 0 - 44 U/L 25 - -   CBC Latest Ref Rng & Units 09/03/2020 09/02/2020 08/16/2018  WBC 4.0 - 10.5 K/uL 10.0 10.6(H) 8.3  Hemoglobin 13.0 - 17.0 g/dL 14.5 15.4 15.8  Hematocrit 39.0 - 52.0 % 41.1 44.0 47.8  Platelets 150 - 400 K/uL 234 265 290   Lipid Panel No results for input(s): CHOL, TRIG, LDLCALC, VLDL, HDL, CHOLHDL, LDLDIRECT in the last 8760 hours. Lipid Panel  No results found for: CHOL, TRIG, HDL, CHOLHDL, VLDL, LDLCALC, LDLDIRECT, LABVLDL   HEMOGLOBIN A1C Lab Results  Component Value Date   HGBA1C 6.6 (H) 09/02/2020   MPG 142.72 09/02/2020   TSH Recent Labs    09/03/20 0225  TSH 0.877    External labs:   Labs 09/08/2020:  Serum  glucose 133 mg,  BUN 19, creatinine 1.1, EGFR 66 mL, potassium 4.9, CMP normal.  Hb 15.3/HCT 43.1, platelets 258, normal indicis.  Labs 05/28/2020:  Total cholesterol 129, triglycerides 116, HDL 35, LDL 71.  Non-HDL cholesterol 94.  TSH normal at 1.30, A1c 6.3%.  Medications and allergies   Allergies  Allergen Reactions   Elemental Sulfur Nausea Only    Outpatient Medications Prior to Visit  Medication Sig Dispense Refill   aspirin (ASPIRIN CHILDRENS) 81 MG chewable tablet Chew 1 tablet (81 mg total) by mouth daily.     atorvastatin (LIPITOR) 10 MG tablet TAKE 1 TABLET BY MOUTH EVERY DAY (Patient taking differently: Take 10 mg by mouth daily.) 90 tablet 0   HYDROcodone-acetaminophen (NORCO/VICODIN) 5-325 MG tablet Take 1 tablet by mouth daily.  0   irbesartan (AVAPRO) 300 MG tablet Take 300 mg by mouth daily.  6   loratadine (CLARITIN) 10 MG tablet Take 10 mg by mouth daily.     meloxicam (MOBIC) 15 MG tablet TAKE 1 TABLET BY MOUTH EVERY DAY (Patient taking differently: Take 15 mg by mouth daily.) 30 tablet 0   metoprolol succinate (TOPROL-XL) 50 MG 24 hr tablet Take 1 tablet (50 mg total) by mouth daily. Take with or immediately following a meal. 90 tablet 3   pantoprazole (PROTONIX) 40 MG tablet Take 40 mg by mouth 2 (two) times daily.  4   sildenafil (VIAGRA) 100 MG tablet Take 1 tablet by mouth as needed.     traZODone (DESYREL) 100 MG tablet Take one tablet daily at bedtime. Please call [phone] to schedule an appt or request refills from PCP. (Patient taking differently: Take 100 mg by mouth at bedtime.) 90 tablet 0   VIMPAT 150 MG TABS TAKE 1 TABLET BY MOUTH TWICE A DAY (Patient taking differently: Take 150 mg by mouth 2 (two) times daily.) 180 tablet 1   hydrochlorothiazide (HYDRODIURIL) 12.5 MG tablet Take 1 tablet (12.5 mg total) by mouth daily.     No facility-administered medications prior to visit.    Medications after today's encounter: Current Outpatient Medications   Medication Instructions   aspirin (ASPIRIN CHILDRENS) 81 mg, Oral, Daily   atorvastatin (LIPITOR) 10 MG tablet TAKE 1 TABLET BY MOUTH EVERY DAY   HYDROcodone-acetaminophen (NORCO/VICODIN) 5-325 MG tablet 1 tablet, Oral, Daily   irbesartan (AVAPRO) 300 mg, Oral, Daily   loratadine (CLARITIN) 10 mg, Oral, Daily   meloxicam (MOBIC) 15 MG tablet TAKE 1 TABLET BY MOUTH EVERY DAY   metoprolol succinate (TOPROL-XL) 50 mg, Oral, Daily, Take with or immediately following a meal.   pantoprazole (PROTONIX) 40 mg, Oral, 2 times daily   sildenafil (VIAGRA) 100 MG tablet 1 tablet, Oral, As needed   traZODone (DESYREL) 100 MG tablet Take one tablet daily at bedtime. Please call [phone] to schedule an appt or request refills from PCP.   VIMPAT 150 MG TABS TAKE 1 TABLET BY MOUTH TWICE A DAY    Radiology:   CT angiogram chest for PE protocol 09/02/2020: 1. Pulmonary embolism in a subsegmental pulmonary artery supplying the right lower lobe. 2. Enlarged right and left pulmonary arteries, suggestive of pulmonary hypertension. 3. A 1.5 cm left thyroid nodule is noted. Non emergent thyroid ultrasound is recommended. 4. The ascending aorta measures 4.0 cm in diameter. Vascular calcifications are seen in the aortic arch. Normal heart size. No pericardial effusion.  Cardiac Studies:   Lexiscan (Walking with Jaci Carrel) Sestamibi Stress Test 01/07/2021: Nondiagnostic ECG stress due to walking pharmacologic stress. Achieved 7.05 METS  on Mod Bruce protocol without EKG changes. Mild dyspnea and dizziness. Myocardial perfusion is normal. Overall LV systolic function is normal without regional wall motion abnormalities. Stress LV EF: 62%. No previous exam available for comparison. Low risk.  Echocardiogram 01/09/2021: Left ventricle cavity is normal in size. Moderate concentric hypertrophy of the left ventricle. Normal global wall motion. Normal LV systolic function with EF 66%. Doppler evidence of grade II  (pseudonormal) diastolic dysfunction, elevated LAP. Left atrial cavity is mildly dilated. Aneurysmal interatrial septum without 2D or color Doppler evidence of shunting. Right atrial cavity is mildly dilated. Mild (Grade I) mitral regurgitation. Moderate tricuspid regurgitation. Estimated pulmonary artery systolic pressure 39 mmHg. Aortic root mildly dilated at 3.8 cm. Previous study in 2019 noted grade 1 DD, mild MR, mild TR, estimated PASP 23 mmHg.  EKG:   EKG 11/27/2020: Normal sinus rhythm at rate of 70 bpm, left atrial abnormality, left axis deviation, left intrafascicular block.  Right bundle branch block.  Bifascicular block.  LVH by voltage criteria.     Assessment     ICD-10-CM   1. Dyspnea on exertion  R06.00     2. Angina pectoris (HCC)  I20.9     3. Bifascicular block  I45.2     4. Primary hypertension  I10     5. Ascending aortic aneurysm (HCC)  I71.2       No orders of the defined types were placed in this encounter.    Medications Discontinued During This Encounter  Medication Reason   hydrochlorothiazide (HYDRODIURIL) 12.5 MG tablet Discontinued by provider     No orders of the defined types were placed in this encounter.  Recommendations:   Samuel Jacobs is a 70 y.o. patient male patient with hypertension, hyperlipidemia, prediabetes mellitus, obesity, remote diagnosis of obstructive sleep apnea, GERD, duodenal paraganglioma SP excision in 2016, complex partial seizures, had an episode of syncope when he tried to get up from bed on 09/02/2020 and was seen in the emergency room and CT scan and revealed left occipital blowout fracture and also right lower lobe pulmonary embolism.  Patient had a long ride to [place] few weeks prior to the onset.  I have seen him 6 weeks ago and had presented with chest pain suggestive of angina pectoris, I will increase the dose of metoprolol succinate from 25 to 50 mg daily.  He has not had any recurrence of chest pain.  Reviewed the  results of the echocardiogram, normal LVEF, mild aortic root dilatation at 3.8 cm and fairly correlates well with CT angiogram of the chest performed in February 2022 which revealed ascending aortic measurement of 4 cm.  He will need continued surveillance of the same, will probably need repeat echocardiogram in a year.  With regard to dyspnea on exertion, suspect is related to obesity hypoventilation, fortunately there is no evidence of pulmonary hypertension or RV failure or RV strain by echocardiogram.  Weight loss discussed with the patient.  Blood pressure is now well controlled.  Lipids are at goal.  Continue present medications, he is presently wearing Zio patch for recurrent episodes of syncope or near syncope, he has had 2 episodes of near syncope while at home and I will follow-up on the Zio patch.  If there is no cardiac arrhythmias, may need neurologic evaluation for his known diagnosis of partial seizures.    I had referred him to be evaluated by Dr. Asencion Partridge Dohmeier for sleep apnea, encouraged him to keep the appointment.   Adrian Prows, MD,  [facility] 01/23/2021, 9:50 AM Office: [phone]

## 2021-01-28 NOTE — Progress Notes (Signed)
Zio patch does not reveal any significant arrhythmia.   He may need neurologic evaluation for his known diagnosis of partial seizures as a cause of near syncope or syncope or consider loop recorder implantation.   Will forward to Dr. Marcial Pacas and ask for her opinion. Please see my OV note 01/23/21

## 2021-01-29 ENCOUNTER — Telehealth: Payer: Self-pay | Admitting: Neurology

## 2021-01-29 NOTE — Telephone Encounter (Signed)
Samuel Prows, MD  Samuel Pacas, MD Zio patch does not reveal any significant arrhythmia.   He may need neurologic evaluation for his known diagnosis of partial seizures as a cause of near syncope or syncope or consider loop recorder implantation.   Will forward to Dr. Marcial Jacobs and ask for her opinion. Please see my OV note7/15/22   Patient was last evaluated by Judson Roch in April 2021, no follow-up planned, please   schedule him with Judson Roch soon

## 2021-02-02 NOTE — Telephone Encounter (Signed)
Patient was a no-show for his appt with Judson Roch on 11/10/20. I scheduled him for her next available, 08/05/21.

## 2021-02-24 ENCOUNTER — Other Ambulatory Visit: Payer: Self-pay | Admitting: Neurology

## 2021-06-15 ENCOUNTER — Other Ambulatory Visit: Payer: Self-pay | Admitting: Neurology

## 2021-06-15 NOTE — Telephone Encounter (Signed)
[facility] Verified LR:05/25/2021 Qty: 180 for 90 days  Last OV:11/08/2019 Pending appointment:  08/05/20

## 2021-08-04 NOTE — Progress Notes (Signed)
PATIENT: Samuel Jacobs DOB: August 25, 1950  REASON FOR VISIT: follow up for seizures HISTORY FROM: patient Primary Neurologist: Dr. Krista Jacobs   HISTORY OF PRESENT ILLNESS: HISTORY 08/21/15 (Per Dr. Krista Jacobs notes): Mr. Samuel Jacobs is a 71 year old male with a history of complex partial seizure seizure  He continue have recurrent seizures while taking Depakote, eventually Vimpat was added on in 2015, Depakote was weaned off, while taking Vimpat 150 mg twice a day, he denies any seizure event.  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 He complains of dizziness, feeling funny with higher dose of Vimpat  Previous sleep study in 2011 showed mild obstructive sleep apnea, CPAP was not recommended at that time.  He has quit tramadol, he is now taking hydrocodone for low back pain. No longer has frequent headaches, was to go back on lower dose of Vimpat   Last seizure was in 2015, his seizure consistent of staring, confusion.  He has difficulty falling to sleep due to his shift job, tried over-the-counter Tylenol PM without benefit, he also complains of depression anxiety   Update February 21 2017: He is taking Vimpat 150 twice a day, there was no recurrent seizure activity, he is driving, trazodone 829 mg for sleep,   UPDATE Apr 26 2018: He is doing very well with Vimpat 150 mg twice a day, there was no recurrent seizure, continue trazodone.   I reviewed the laboratory from [facility] in July 2019, normal CMP creatinine of 1.0, hemoglobin 15.2,  Update November 08, 2019 SS: He continues to do very well on Vimpat 150 mg twice a day.  Last seizure was in 2015.  He continues  on trazodone 100 mg at bedtime for sleep.  His PCP is filling the trazodone.  He is tolerating Vimpat, his overall health has been good.  He works full-time, in [employer].  His PCP prefers he be seen here on an annual basis for seizures.  No new problems or concerns.  Update August 05, 2021 SS: here today alone, no seizures, last was 2015, remains on Vimpat 150 mg twice daily. PCP wanted him checked for sleep apnea, not much snoring, on trazodone 100 mg at bedtime, in morning has fatigue, hard to get going, denies morning headache. As a child had sleep walking. At night he does kick, tends to move his arms in bed. Has had blood testing for fatigue, TSH which was reportedly ok. In past had sleep study, showed mild OSA, never used the mask.   REVIEW OF SYSTEMS: Out of a complete 14 system review of symptoms, the patient complains only of the following symptoms, and all other reviewed systems are negative.  See HPI  ALLERGIES: Allergies  Allergen Reactions   Elemental Sulfur Nausea Only    HOME MEDICATIONS: Outpatient Medications Prior to Visit  Medication Sig Dispense Refill   aspirin (ASPIRIN CHILDRENS) 81 MG chewable tablet Chew 1 tablet (81 mg total) by mouth daily.     atorvastatin (LIPITOR) 10 MG tablet TAKE 1 TABLET BY MOUTH EVERY DAY (Patient taking differently: Take 10 mg by mouth daily.) 90 tablet 0   HYDROcodone-acetaminophen (NORCO/VICODIN) 5-325 MG tablet Take 1 tablet by  mouth daily.  0   irbesartan (AVAPRO) 300 MG tablet Take 300 mg by mouth daily.  6   Lacosamide 150 MG TABS TAKE 1 TABLET BY MOUTH TWICE A DAY 180 tablet 1   loratadine (CLARITIN) 10 MG tablet Take 10 mg by mouth daily.     meloxicam (MOBIC) 15 MG tablet TAKE 1 TABLET BY MOUTH EVERY DAY (Patient taking differently: Take 15 mg by mouth daily.) 30 tablet 0   metoprolol succinate (TOPROL-XL) 50 MG 24 hr tablet Take 1 tablet (50 mg total) by mouth daily. Take with or immediately following a meal. 90 tablet  3   pantoprazole (PROTONIX) 40 MG tablet Take 40 mg by mouth 2 (two) times daily.  4   sildenafil (VIAGRA) 100 MG tablet Take 1 tablet by mouth as needed.     traZODone (DESYREL) 100 MG tablet Take one tablet daily at bedtime. Please call [phone] to schedule an appt or request refills from PCP. (Patient taking differently: Take 100 mg by mouth at bedtime.) 90 tablet 0   No facility-administered medications prior to visit.    PAST MEDICAL HISTORY: Past Medical History:  Diagnosis Date   Allergic rhinitis    Allergy    Anxiety    Arthritis    Carpal tunnel syndrome    09-12-15 no longer a problem.   Colon polyps    Complication of anesthesia    one time awaken- not with last procedure done.   Depression    ED (erectile dysfunction)    GERD (gastroesophageal reflux disease)    HLD (hyperlipidemia)    Hypertension    Insomnia    Seizure ([place])    08-13-15 per pt his last seizure was "about 1 year ago".  maw   Sleep apnea    pt does not wear c-pap    PAST SURGICAL HISTORY: Past Surgical History:  Procedure Laterality Date   CHOLECYSTECTOMY  11/07/2015   COLONOSCOPY     EUS N/A 09/18/2015   Procedure: UPPER ENDOSCOPIC ULTRASOUND (EUS) LINEAR;  Surgeon: Milus Banister, MD;  Location: [facility] ENDOSCOPY;  Service: Endoscopy;  Laterality: N/A;   TONSILLECTOMY     TUMOR REMOVAL  11/07/2015   Cancerous tumor removed from duodenum.   UPPER GASTROINTESTINAL ENDOSCOPY     VIDEO BRONCHOSCOPY Bilateral 07/27/2018   Procedure: VIDEO BRONCHOSCOPY WITHOUT FLUORO;  Surgeon: Collene Gobble, MD;  [facility];  Service: Cardiopulmonary;  Laterality: Bilateral;   WISDOM TOOTH EXTRACTION      FAMILY HISTORY: Family History  Problem Relation Age of Onset   Breast cancer Mother        mets to liver   Stomach cancer Father    Epilepsy Sister    Cancer Maternal Grandmother        type unknown   Cancer Maternal Grandfather        type unknown   Cancer Paternal Grandfather        type  unknown   Cancer Paternal Grandmother        type unknown   Colon cancer Neg Hx    Esophageal cancer Neg Hx    Rectal cancer Neg Hx    Prostate cancer Neg Hx     SOCIAL HISTORY: Social History   Socioeconomic History   Marital status: Married    Spouse name: Gailen Shelter   Number of children: 3   Years of education: college   Highest education level: Not on file  Occupational History   Occupation: semi-retired  Tobacco Use   Smoking status: Never   Smokeless tobacco: Never  Vaping Use   Vaping Use: Never used  Substance and Sexual Activity   Alcohol use: No    Alcohol/week: 0.0 standard drinks    Comment: past -none in 7-8 yrs social only   Drug use: No   Sexual activity: Not on file  Other Topics Concern   Not on file  Social History Narrative   Patient lives at home with wife Neoma Laming.    Patient has retired and works part time at [employer]   Patient has 3 children.    Patient has a college education.   Patient is right handed.   Social Determinants of Health   Financial Resource Strain: Not on file  Food Insecurity: Not on file  Transportation Needs: Not on file  Physical Activity: Not on file  Stress: Not on file  Social Connections: Not on file  Intimate Partner Violence: Not on file   PHYSICAL EXAM  There were no vitals filed for this visit.  There is no height or weight on file to calculate BMI.  Generalized: Well developed, in no acute distress   Neurological examination  Mentation: Alert oriented to time, place, history taking. Follows all commands speech and language fluent Cranial nerve II-XII: Pupils were equal round reactive to light. Extraocular movements were full, visual field were full on confrontational test. Facial sensation and strength were normal. Head turning and shoulder shrug  were normal and symmetric. Motor: The motor testing reveals 5 over 5 strength of all 4 extremities. Good symmetric motor tone is noted throughout.  Sensory:  Sensory testing is intact to soft touch on all 4 extremities. No evidence of extinction is noted.  Coordination: Cerebellar testing reveals good finger-nose-finger and heel-to-shin bilaterally.  Gait and station: Gait is normal. Tandem gait is normal.  No drift is seen.  Reflexes: Deep tendon reflexes are symmetric and normal bilaterally.   DIAGNOSTIC DATA (LABS, IMAGING, TESTING) - I reviewed patient records, labs, notes, testing and imaging myself where available.  Lab Results  Component Value Date   WBC 10.0 09/03/2020   HGB 14.5 09/03/2020   HCT 41.1 09/03/2020   MCV 87.6 09/03/2020   PLT 234 09/03/2020      Component Value Date/Time   NA 138 09/03/2020 0225   NA 134 05/02/2014 1139   K 4.1 09/03/2020 0225   CL 103 09/03/2020 0225   CO2 24 09/03/2020 0225   GLUCOSE 122 (H) 09/03/2020 0225   BUN 17 09/03/2020 0225   BUN 16 05/02/2014 1139   CREATININE 0.99 09/03/2020 0225   CALCIUM 9.0 09/03/2020 0225   PROT 6.3 (L) 09/03/2020 0225   PROT 7.3 08/23/2016 0836   ALBUMIN 3.5 09/03/2020 0225   ALBUMIN 4.4 08/23/2016 0836   AST 20 09/03/2020 0225   ALT 25 09/03/2020 0225   ALKPHOS 46 09/03/2020 0225   BILITOT 0.7 09/03/2020 0225   BILITOT 0.3 08/23/2016 0836   GFRNONAA >60 09/03/2020 0225   GFRAA >60 08/16/2018 0500   No results found for: CHOL, HDL, LDLCALC, LDLDIRECT, TRIG, CHOLHDL Lab Results  Component Value Date   HGBA1C 6.6 (H) 09/02/2020   No results found for: VITAMINB12 Lab Results  Component Value Date   TSH 0.877 09/03/2020      ASSESSMENT AND PLAN 71 y.o. year old male  has a past medical history of Allergic rhinitis, Allergy, Anxiety, Arthritis, Carpal tunnel syndrome, Colon polyps, Complication of anesthesia, Depression, ED (erectile dysfunction), GERD (  gastroesophageal reflux disease), HLD (hyperlipidemia), Hypertension, Insomnia, Seizure ([place]), and Sleep apnea. here with:  1.  Epilepsy -Last seizure was in 2015 -Continue Vimpat 150 mg twice a  day, refills were just sent in Dec 2022 -Call for seizure activity, otherwise follow-up in 1 year or sooner if needed  2.  Chronic insomnia 3.  Daytime fatigue  -referral for sleep consult, PCP requested  -A lot of daytime fatigue, in past reportedly had HST showed mild OSA, never used CPAP, BMI 35, reportedly minimal snoring -ESS 18 today -On trazodone from PCP  Butler Denmark, AGNP-C, DNP 08/04/2021, 4:09 PM [facility] [address], [place], [place] [address] [phone]

## 2021-08-05 ENCOUNTER — Encounter: Payer: Self-pay | Admitting: Neurology

## 2021-08-05 ENCOUNTER — Ambulatory Visit (INDEPENDENT_AMBULATORY_CARE_PROVIDER_SITE_OTHER): Payer: Managed Care, Other (non HMO) | Admitting: Neurology

## 2021-08-05 VITALS — BP 159/73 | HR 57 | Ht 71.0 in | Wt 257.0 lb

## 2021-08-05 DIAGNOSIS — R400 Somnolence: Secondary | ICD-10-CM | POA: Diagnosis not present

## 2021-08-05 DIAGNOSIS — G40109 Localization-related (focal) (partial) symptomatic epilepsy and epileptic syndromes with simple partial seizures, not intractable, without status epilepticus: Secondary | ICD-10-CM

## 2021-09-16 ENCOUNTER — Encounter: Payer: Self-pay | Admitting: Neurology

## 2021-09-16 ENCOUNTER — Ambulatory Visit (INDEPENDENT_AMBULATORY_CARE_PROVIDER_SITE_OTHER): Payer: Managed Care, Other (non HMO) | Admitting: Neurology

## 2021-09-16 VITALS — BP 138/73 | HR 69 | Ht 71.0 in | Wt 256.5 lb

## 2021-09-16 DIAGNOSIS — G9331 Postviral fatigue syndrome: Secondary | ICD-10-CM | POA: Insufficient documentation

## 2021-09-16 DIAGNOSIS — R400 Somnolence: Secondary | ICD-10-CM

## 2021-09-16 DIAGNOSIS — G40109 Localization-related (focal) (partial) symptomatic epilepsy and epileptic syndromes with simple partial seizures, not intractable, without status epilepticus: Secondary | ICD-10-CM

## 2021-09-16 DIAGNOSIS — F518 Other sleep disorders not due to a substance or known physiological condition: Secondary | ICD-10-CM

## 2021-09-16 DIAGNOSIS — R5383 Other fatigue: Secondary | ICD-10-CM

## 2021-09-16 DIAGNOSIS — G4719 Other hypersomnia: Secondary | ICD-10-CM

## 2021-09-16 NOTE — Progress Notes (Signed)
SLEEP MEDICINE CLINIC    Provider:  Larey Seat, MD  Primary Care Physician:  Samuel Jacobs     Referring Provider: Adrian Jacobs, [address] [place],  [place] [address]   Primary neurologist: Samuel Jacobs.          Chief Complaint according to patient   Patient presents with:     New Patient (Initial Visit)           HISTORY OF PRESENT ILLNESS:  Samuel Jacobs is a  meanwhile 71 y.o. Caucasian male patient of dr Samuel Jacobs and seen here upon his  referral on 09/16/2021 Chief concern according to patient :  I am very tired,  chronic insomnia,  nocturia, and remain claustrophobic over the last decade, even the pandemic masks didnt' change that.      He  has a past medical history of Allergic rhinitis, Allergy, Anxiety, Arthritis, Carpal tunnel syndrome, Colon polyps, Complication of anesthesia, Depression, ED (erectile dysfunction), GERD (gastroesophageal reflux disease), HLD (hyperlipidemia), Hypertension, Insomnia, Seizure ([place]), and Sleep apnea.   The patient had the first sleep study in the year 2011 : At the time he was referred by Dr. Dagmar Jacobs the study was performed on 8-19 2011.  There was a mask fitting ordered prior to the sleep study beginning but the patient could not tolerate any of the interfaces.  He had severe anxiety and he became tachycardic.  His overall result was that of mild apnea only his AHI was 8.7/h he slept about 40% of the night in supine position when his AHI became 21.6/h.  There was no clinically relevant apnea noted when he was not sleeping on his back and when he was not in rem sleep.  Lowest oxygen saturation at nadir 82% with only 10.4 minutes of total desaturation time.  The patient was snoring loudly while sleeping supine.  Samuel Jacobs is now presenting as a 71 year old gentleman with hypertension, hyperlipidemia stated prediabetes mellitus, obesity, remote diagnosis of mild sleep apnea, he referred  himself back to Samuel Jacobs and reported worsening dyspnea on exertion for the last 6 months.  Sometimes he has episodes of chest tightness in the center of the chest.  He had one episode of fainting about a months prior to presenting to Samuel Jacobs which was in May 2022.  This occurred while he was sitting at rest watching TV.  In February 2022 he reported having left his bed and standing up only to faint and fall and hit his head.  He had to go to the hospital to have the wound cared for.  He had seen Samuel Jacobs after that event.    His last visit in this office was with Samuel Jacobs nurse practitioner for Dr. On 05 August 2021.  He is not so much concerned about snoring or sleep apnea as about a latent and extreme fatigue.  Samuel Jacobs in May 2022 quoted an echocardiogram from August 2019 stating left ventricular cavity was normal in size but moderate concentric hypertrophy of the left ventricle was noted normal global wall motion, calculated ejection fraction 57% mild mitral regurgitation and tricuspid regurgitation estimated pulmonary artery pressure was 23 mmHg.  He had a normal sinus rhythm by EKG heart rate 70 bpm on average left atrial abnormality and left axis deviation left intrafascicular block.  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  The patient was ordered aspirin and metoprolol.   Sleep relevant medical history:  Nocturia 3 times or more- for years- moving in his sleep, was a sleep walker I his youth, REM BD- acting out  dreams., had a Tonsillectomy 1990,deviated septum unrepaired , sinuitis chronic , post nasal drip.     Family medical /sleep history: No other family member on CPAP with OSA,   Social history:  Patient is working as Community education officer,  and lives in a household with spouse,  3 adult children. The patient currently works from [employer] and some days in office. 2 dogs are present. Tobacco use- none .  ETOH use - none ,  Caffeine intake in form of Coffee( 2) Soda( 2) Tea ( 2) and  consumes 2 energy drinks., al together 6 drinks with caffeine a day !! No regular exercise .      Sleep habits are as follows: The patient's dinner time is between 7-8 PM. The patient goes to bed at 10-12 PM and is going to sleep with medications- continues to sleep for 6 hours, wakes for 3 bathroom breaks, the first time at 2 AM.   The preferred sleep position is sideways- primarily left, with the support of 2 pillows.  Dreams are reportedly frequent/vivid.  7  AM is the usual rise time. The patient wakes up with an alarm.  He  reports not feeling refreshed or restored in AM, with symptoms such as dry mouth, morning headaches, and residual fatigue.  Naps are taken frequently, lasting from 15 to 30 minutes and are more refreshing than nocturnal sleep.    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   Ankle edema, anxiety,   Psychomotor slowing.  Vertigo.  Fatigue, sleepiness , snoring, fragmented sleep, some Insomnia due to Nocturia breaks.    How likely are you to doze in the following situations: 0 = not likely, 1 = slight chance, 2 = moderate chance, 3 = high chance   Sitting and Reading? Watching Television? Sitting inactive in a public place (theater or meeting)? As a passenger in a car for an hour without a break? Lying down in the afternoon when circumstances permit? Sitting and talking to someone? Sitting quietly after lunch without alcohol? In a car, while stopped for a few minutes in traffic?   Total = 16/ 24 points   FSS endorsed at 56/ 63 points.   GDS : 6/ 15 , GAD.   Social History   Socioeconomic History   Marital status: Married    Spouse name: Samuel Jacobs   Number of children: 3   Years of education: college   Highest education level: Not on file  Occupational History   Occupation:  semi-retired  Tobacco Use   Smoking status: Never   Smokeless tobacco: Never  Vaping Use   Vaping Use: Never used  Substance and Sexual Activity   Alcohol use: No    Alcohol/week: 0.0 standard drinks    Comment: past -none in 7-8 yrs social only   Drug use: No   Sexual activity: Not on file  Other Topics Concern   Not on file  Social History Narrative   Patient lives at home with wife Neoma Laming.    Patient has retired and works part time at [employer]   Patient has 3 children.    Patient has a college education.   Patient is right handed.  Social Determinants of Health   Financial Resource Strain: Not on file  Food Insecurity: Not on file  Transportation Needs: Not on file  Physical Activity: Not on file  Stress: Not on file  Social Connections: Not on file    Family History  Problem Relation Age of Onset   Breast cancer Mother        mets to liver   Stomach cancer Father    Epilepsy Sister    Cancer Maternal Grandmother        type unknown   Cancer Maternal Grandfather        type unknown   Cancer Paternal Grandfather        type unknown   Cancer Paternal Grandmother        type unknown   Colon cancer Neg Hx    Esophageal cancer Neg Hx    Rectal cancer Neg Hx    Prostate cancer Neg Hx     Past Medical History:  Diagnosis Date   Allergic rhinitis    Allergy    Anxiety    Arthritis    Carpal tunnel syndrome    09-12-15 no longer a problem.   Colon polyps    Complication of anesthesia    one time awaken- not with last procedure done.   Depression    ED (erectile dysfunction)    GERD (gastroesophageal reflux disease)    HLD (hyperlipidemia)    Hypertension    Insomnia    Seizure ([place])    08-13-15 per pt his last seizure was "about 1 year ago".  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   UPPER GASTROINTESTINAL ENDOSCOPY     VIDEO BRONCHOSCOPY Bilateral 07/27/2018   Procedure: VIDEO BRONCHOSCOPY WITHOUT FLUORO;  Surgeon: Collene Gobble, MD;  Location: [facility] ENDOSCOPY;  Service: Cardiopulmonary;  Laterality: Bilateral;   WISDOM TOOTH EXTRACTION       Current Outpatient Medications on File Prior to Visit  Medication Sig Dispense Refill   aspirin (ASPIRIN CHILDRENS) 81 MG chewable tablet Chew 1 tablet (81 mg total) by mouth daily.     atorvastatin (LIPITOR) 10 MG tablet TAKE 1 TABLET BY MOUTH EVERY DAY (Patient taking differently: Take 10 mg by mouth daily.) 90 tablet 0   HYDROcodone-acetaminophen (NORCO/VICODIN) 5-325 MG tablet Take 1 tablet by mouth daily.  0   irbesartan (AVAPRO) 300 MG tablet Take 300 mg by mouth daily.  6   Lacosamide 150 MG TABS TAKE 1 TABLET BY MOUTH TWICE A DAY 180 tablet 1   loratadine (CLARITIN) 10 MG tablet Take 10 mg by mouth daily.     meloxicam (MOBIC) 15 MG tablet TAKE 1 TABLET BY MOUTH EVERY DAY (Patient taking differently: Take 15 mg by mouth daily.) 30 tablet 0   metoprolol succinate (TOPROL-XL) 50 MG 24 hr tablet Take 1 tablet (50 mg total) by mouth daily. Take with or immediately following a meal. 90 tablet 3   pantoprazole (PROTONIX) 40 MG tablet Take 40 mg by mouth 2 (two) times daily.  4  sildenafil (VIAGRA) 100 MG tablet Take 1 tablet by mouth as needed.     traZODone (DESYREL) 100 MG tablet Take one tablet daily at bedtime. Please call [phone] to schedule an appt or request refills from PCP. (Patient taking differently: Take 100 mg by mouth at bedtime.) 90 tablet 0   No current facility-administered medications on file prior to visit.    Allergies  Allergen Reactions   Elemental Sulfur Nausea Only     Physical exam:  Today's Vitals   09/16/21 1447  BP: 138/73  Pulse: 69  Weight: 256 lb 8 oz (116.3 kg)  Height: '5\' 11"'$  (1.803 m)   Body mass index is 35.77 kg/m.   Wt Readings from Last 3 Encounters:  09/16/21 256 lb 8 oz (116.3 kg)  08/05/21 257 lb (116.6 kg)  01/23/21 261 lb (118.4 kg)     Ht Readings from Last 3 Encounters:  09/16/21 '5\' 11"'$  (1.803 m)  08/05/21 '5\' 11"'$  (1.803 m)  01/23/21 '5\' 11"'$  (1.803 m)      General: The patient is awake, alert and appears not in acute distress.Head: Normocephalic, atraumatic.  Not well groomed.  Neck is supple. Mallampati 3,  neck circumference:18 inches .  Nasal airflow barely patent.  Retrognathia is not seen. Facial hair.  Dental status: irregular , tongue is scalloped.  Cardiovascular:  Regular rate and cardiac rhythm by pulse,  without distended neck veins. Respiratory: Lungs are clear to auscultation.  Skin:  Without evidence of ankle edema, or rash. Trunk: The patient's posture is erect.   Neurologic exam : The patient is awake and alert, oriented to place and time.   Memory subjective described as intact.  Attention span & concentration ability appears normal.  Speech is slowed  Mood and affect are slowed, depressed.   Cranial nerves: no loss of smell or taste reported  Pupils are equal and briskly reactive to light. Funduscopic exam deferred.  Extraocular movements in vertical and horizontal planes were intact and without nystagmus. No Diplopia. Visual fields by finger perimetry are intact. Hearing was intact to soft voice and finger rubbing.    Facial sensation intact to fine touch.  Facial motor strength is symmetric and tongue and uvula move midline.  Neck ROM : rotation, tilt and flexion extension were normal for age and shoulder shrug was symmetrical.    Motor exam:  Symmetric bulk, tone and ROM.   Normal tone without cog wheeling, symmetric grip strength .   Sensory:  Fine touch and vibration were decreased  at both ankles- ankles are puffy.  Pin and needles in toes,  Proprioception tested in the upper extremities was normal.   Coordination: Rapid alternating movements in the fingers/hands were of normal speed.  The Finger-to-nose maneuver was intact without evidence of ataxia, dysmetria or tremor.   Gait and station: Patient could rise unassisted from a seated position, walked without assistive device. He feels off balance- feels insecure, especially when stepping  on an escalator.  Stance is of normal width/ base,  and the patient turned with 4 steps.  Toe and heel walk were deferred.  Deep tendon reflexes: in the  upper and lower extremities are trace only, rigid muscle tone, failure of relaxation.  Babinski response was deferred/       After spending a total time of  45  minutes face to face and additional time for physical and neurologic examination, review of laboratory studies,  personal review of imaging studies, reports and results of other testing and  review of referral information / records as far as provided in visit, I have established the following assessments:  1) chronic fatigue, history of mild OSA ( and supine sleep  dependent form of apnea ) in 2011.  Worsening since Covid infection 05-2021.  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  4) balance and syncope - vertigo- Dr Krista Blue.    My Plan is to proceed with:  1) I will use a HST to screen for sleep apnea in this claustrophobic patient , if apnea is a concern at this time.  I could address REM BD questions in an attended sleep study, but the patient feels he wouldn't want to go through with this again.  2) avoid supine sleep at home - not during the HST though.  3) fatigue is possibly not at all related to OSA and more to depression, anxiety.  4) melatonin at sleep onset. 3 mg or less, this may suppress REM BD.   I would like to thank [facility],  Steva Ready, MD and Samuel Jacobs, [place],  [place] [address] for allowing me to meet with and to take care of this pleasant patient.   II plan to follow up either personally or through our NP within 2-4 months.   CC: I will share my notes with PCP and dr Einar Jacobs, Dr. Krista Blue .  Electronically signed by: Larey Seat, MD 09/16/2021 3:28 PM  [facility] Board certified by The [employer] of Sleep Medicine and Diplomate of the [employer] of Sleep Medicine. Board certified In Neurology through the [employer], Fellow of the [employer] of Neurology. Medical Director of [facility].

## 2021-09-29 ENCOUNTER — Telehealth: Payer: Self-pay

## 2021-09-29 NOTE — Telephone Encounter (Signed)
LVM for pt to call me back to schedule sleep study  

## 2021-10-08 ENCOUNTER — Ambulatory Visit: Payer: Medicare Other | Admitting: Podiatry

## 2021-10-19 ENCOUNTER — Ambulatory Visit: Payer: Medicare Other | Admitting: Podiatry

## 2021-10-21 ENCOUNTER — Ambulatory Visit (INDEPENDENT_AMBULATORY_CARE_PROVIDER_SITE_OTHER): Payer: Managed Care, Other (non HMO) | Admitting: Neurology

## 2021-10-21 DIAGNOSIS — G9331 Postviral fatigue syndrome: Secondary | ICD-10-CM

## 2021-10-21 DIAGNOSIS — G40109 Localization-related (focal) (partial) symptomatic epilepsy and epileptic syndromes with simple partial seizures, not intractable, without status epilepticus: Secondary | ICD-10-CM

## 2021-10-21 DIAGNOSIS — G4719 Other hypersomnia: Secondary | ICD-10-CM

## 2021-10-21 DIAGNOSIS — R5383 Other fatigue: Secondary | ICD-10-CM

## 2021-10-21 DIAGNOSIS — R400 Somnolence: Secondary | ICD-10-CM

## 2021-10-21 DIAGNOSIS — G4733 Obstructive sleep apnea (adult) (pediatric): Secondary | ICD-10-CM | POA: Diagnosis not present

## 2021-10-21 DIAGNOSIS — F518 Other sleep disorders not due to a substance or known physiological condition: Secondary | ICD-10-CM

## 2021-10-27 NOTE — Progress Notes (Signed)
? ? ?  ?  ?[facility] ?  ?HOME SLEEP TEST REPORT ( by Watch PAT)   ?STUDY DATA available 10-27-2021:   ?  ?ORDERING CLINICIAN: Larey Seat, MD  ?REFERRING CLINICIAN: Dr Einar Gip PCP: Dr Dagmar Hait ?  ?CLINICAL INFORMATION/HISTORY: 09/16/2021 Chief concern according to patient : " I am very tired,  I have chronic insomnia,  nocturia, and remained claustrophobic over the last decade, even the pandemic masks didnt' change that".  ?  ?  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 ?  ?The patient had the first sleep study in the year 2011 : At the time he was referred by Dr. Dagmar Hait the study was performed on 8-19 2011.  There was a mask fitting ordered prior to the sleep study beginning but the patient could not tolerate any of the interfaces.  He had severe anxiety and he became tachycardic.  His overall result was that of mild apnea only; his AHI was 8.7/h. He slept about 40% of the night in supine position when his AHI became 21.6/h.   ?There was no clinically relevant apnea noted when he was not sleeping on his back and not in REM sleep. Lowest oxygen saturation at nadir 82% with only 10.4 minutes of total desaturation time.  The patient was snoring loudly while sleeping supine.  ? ? ?  ?Epworth sleepiness score: 16/24. Fatigue SS at 56-63 points, Depression score 6/ 15 ( clinically significant )   ?  ?BMI: 35.8kg/m? ?  ?Neck Circumference: 18" ?  ?FINDINGS: ?  ?Sleep Summary: ?  ?Total Recording Time (hours, min): Total recording time amounted to 7 hours 30 minutes of which 6 hours and 57 minutes was a total sleep time.     ? Percent REM (%): 20.9%                                    ?  ?Respiratory Indices: ?   ?Calculated pAHI (per hour): 5.4/h                         ?  ?REM pAHI:     9.6/h                                          ?  ?NREM pAHI: 4.2/h                          ?  ?Positional this patient slept the entire night in non-supine position; the majority on the right side with an AHI of 1.5/h, followed by sleep on the left side with an AHI of 11.2/h, and in prone sleep position with an AHI of 9.4/h.   ?Snoring data show snoring only present during 7.3% of sleep time with a mean volume of 40 dB.                                              ?  ?  Oxygen Saturation Statistics: ? O2 Saturation Range (%): Varied between a nadir of 85 and a maximum of 96% with a mean saturation at 91%.                                   ?  ?O2 Saturation (minutes) <89%:        4.3 minutes ?Pulse Rate Statistics: ? ?Pulse Range:     Between 47 and 76 bpm with a mean heart rate of 56 bpm.   ?Please note that this home sleep test can only give heart rate but not heart rhythm data.          ?  ?IMPRESSION:  This HST confirms the presence of very mild sleep apnea.  The sleep apnea is not complicated by hypoxia or tachybradycardia.  The attached hypnogram shows that the patient slept fairly uninterrupted. ?I would consider intervention by CPAP an option for this patient but main therapy would be avoiding any other sleep position but the right side this alone will reduce the AHI ,or apnea hypopnea index, to below 5/h. ?  ?RECOMMENDATION: I do not think that this patient needs to try CPAP since his apnea is very mild. ?I do not have a good explanation for his high degree of daytime sleepiness and is even higher degree of fatigue.   ?The cause may be outside of an organic sleep disorder consider depression, autoimmune diseases with chronic inflammatory state, medication side effects.   ? ?INTERPRETING PHYSICIAN: ? ? Larey Seat, MD  ? ?Medical Director of [facility].  ? ? ? ? ? ? ? ? ? ? ? ? ? ? ? ? ? ? ? ?

## 2021-10-30 NOTE — Procedures (Signed)
? ? ?  ?  ?[facility] ?  ?HOME SLEEP TEST REPORT ( by Watch PAT)   ?STUDY DATA available 10-27-2021:   ?  ?ORDERING CLINICIAN: Larey Seat, MD  ?REFERRING CLINICIAN: Dr Einar Gip PCP: Dr Dagmar Hait ?  ?CLINICAL INFORMATION/HISTORY: 09/16/2021 Chief concern according to patient : " I am very tired,  I have chronic insomnia,  nocturia, and remained claustrophobic over the last decade, even the pandemic masks didnt' change that".  ?  ?  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 ?  ?The patient had the first sleep study in the year 2011 : At the time he was referred by Dr. Dagmar Hait the study was performed on 8-19 2011.  There was a mask fitting ordered prior to the sleep study beginning but the patient could not tolerate any of the interfaces.  He had severe anxiety and he became tachycardic.  His overall result was that of mild apnea only; his AHI was 8.7/h. He slept about 40% of the night in supine position when his AHI became 21.6/h.   ?There was no clinically relevant apnea noted when he was not sleeping on his back and not in REM sleep. Lowest oxygen saturation at nadir 82% with only 10.4 minutes of total desaturation time.  The patient was snoring loudly while sleeping supine.  ? ? ?  ?Epworth sleepiness score: 16/24. Fatigue SS at 56-63 points, Depression score 6/ 15 ( clinically significant )   ?  ?BMI: 35.8kg/m? ?  ?Neck Circumference: 18" ?  ?FINDINGS: ?  ?Sleep Summary: ?  ?Total Recording Time (hours, min): Total recording time amounted to 7 hours 30 minutes of which 6 hours and 57 minutes was a total sleep time.     ? Percent REM (%): 20.9%                                    ?  ?Respiratory Indices: ?   ?Calculated pAHI (per hour): 5.4/h                         ?  ?REM pAHI:     9.6/h                                          ?  ?NREM pAHI: 4.2/h                          ?  ?Positional this patient slept the entire night in non-supine position; the majority on the right side with an AHI of 1.5/h, followed by sleep on the left side with an AHI of 11.2/h, and in prone sleep position with an AHI of 9.4/h.   ?Snoring data show snoring only present during 7.3% of sleep time with a mean volume of 40 dB.                                              ?  ?  Oxygen Saturation Statistics: ? O2 Saturation Range (%): Varied between a nadir of 85 and a maximum of 96% with a mean saturation at 91%.                                   ?  ?O2 Saturation (minutes) <89%:        4.3 minutes ?Pulse Rate Statistics: ? ?Pulse Range:     Between 47 and 76 bpm with a mean heart rate of 56 bpm.   ?Please note that this home sleep test can only give heart rate but not heart rhythm data.          ?  ?IMPRESSION:  This HST confirms the presence of very mild sleep apnea.  The sleep apnea is not complicated by hypoxia or tachybradycardia.  The attached hypnogram shows that the patient slept fairly uninterrupted. ?I would consider intervention by CPAP an option for this patient but main therapy would be avoiding any other sleep position but the right side this alone will reduce the AHI ,or apnea hypopnea index, to below 5/h. ?  ?RECOMMENDATION: I do not think that this patient needs to try CPAP since his apnea is very mild. ?I do not have a good explanation for his high degree of daytime sleepiness and is even higher degree of fatigue.   ?The cause may be outside of an organic sleep disorder consider depression, autoimmune diseases with chronic inflammatory state, medication side effects.   ? ?INTERPRETING PHYSICIAN: ? ? Larey Seat, MD  ? ?Medical Director of [facility].  ? ? ? ? ? ? ? ? ? ? ? ? ? ? ? ? ?

## 2021-11-01 NOTE — Progress Notes (Signed)
Home sleep study 10/27/2021: ?This HST confirms the presence of very mild sleep apnea.  ?The sleep apnea is not complicated by hypoxia or tachybradycardia.  Would consider intervention by CPAP an option for this patient but main therapy would be avoiding any other sleep position but the right side this alone will reduce the AHI ,or apnea hypopnea index, to below 5/h.

## 2021-11-03 ENCOUNTER — Encounter: Payer: Self-pay | Admitting: Neurology

## 2022-02-22 ENCOUNTER — Other Ambulatory Visit: Payer: Self-pay | Admitting: Neurology

## 2022-02-23 NOTE — Telephone Encounter (Signed)
[place] drug registry has been verified. Last refill was 11/26/2021 # 180 for a 90 day supply.

## 2022-08-05 ENCOUNTER — Ambulatory Visit (INDEPENDENT_AMBULATORY_CARE_PROVIDER_SITE_OTHER): Payer: Managed Care, Other (non HMO) | Admitting: Neurology

## 2022-08-05 ENCOUNTER — Encounter: Payer: Self-pay | Admitting: Neurology

## 2022-08-05 VITALS — BP 147/77 | HR 59 | Ht 71.0 in | Wt 249.0 lb

## 2022-08-05 DIAGNOSIS — G40109 Localization-related (focal) (partial) symptomatic epilepsy and epileptic syndromes with simple partial seizures, not intractable, without status epilepticus: Secondary | ICD-10-CM | POA: Diagnosis not present

## 2022-08-05 MED ORDER — LACOSAMIDE 150 MG PO TABS: 1.0000 | ORAL_TABLET | Freq: Two times a day (BID) | ORAL | 1 refills | Status: DC

## 2022-08-05 NOTE — Patient Instructions (Signed)
Please continue Vimpat for seizure prevention  Call for any seizures  Sleep study did not show any significant sleep apnea, if you decide you want to try cpap let me know See you back in 1 year

## 2022-08-05 NOTE — Progress Notes (Signed)
PATIENT: Samuel Jacobs DOB: 1950/07/18  REASON FOR VISIT: follow up for seizures HISTORY FROM: patient Primary Neurologist: Dr. Krista Blue   HISTORY OF PRESENT ILLNESS: HISTORY 08/21/15 (Per Dr. Krista Blue notes): Mr. Samuel Jacobs is a 72 year old male with a history of complex partial seizure seizure  He continue have recurrent seizures while taking Depakote, eventually Vimpat was added on in 2015, Depakote was weaned off, while taking Vimpat 150 mg twice a day, he denies any seizure event.  His seizure consistent of staring episodes, he worked at third shift job, driving forklift, stocking for [employer], he has difficulty sleeping during the daytime, for a while he began to experience episode of funny feeling in his head, followed by nausea, and dizziness, followed by bifrontal headaches, vimpat was increased from '150mg'$  bid to '200mg'$  bid in last visit in November 2016. He complains of dizziness, feeling funny with higher dose of Vimpat  Previous sleep study in 2011 showed mild obstructive sleep apnea, CPAP was not recommended at that time.  He has quit tramadol, he is now taking hydrocodone for low back pain. No longer has frequent headaches, was to go back on lower dose of Vimpat   Last seizure was in 2015, his seizure consistent of staring, confusion.  He has difficulty falling to sleep due to his shift job, tried over-the-counter Tylenol PM without benefit, he also complains of depression anxiety   Update February 21 2017: He is taking Vimpat 150 twice a day, there was no recurrent seizure activity, he is driving, trazodone 782 mg for sleep,   UPDATE Apr 26 2018: He is doing very well with Vimpat 150 mg twice a day, there was no recurrent seizure, continue trazodone.   I reviewed the laboratory from [facility] in July 2019, normal CMP creatinine of 1.0, hemoglobin 15.2,  Update November 08, 2019 SS: He continues to do very well on Vimpat 150 mg twice a day.  Last seizure was in 2015.  He continues  on trazodone 100 mg at bedtime for sleep.  His PCP is filling the trazodone.  He is tolerating Vimpat, his overall health has been good.  He works full-time, in [employer].  His PCP prefers he be seen here on an annual basis for seizures.  No new problems or concerns.  Update August 05, 2021 SS: here today alone, no seizures, last was 2015, remains on Vimpat 150 mg twice daily. PCP wanted him checked for sleep apnea, not much snoring, on trazodone 100 mg at bedtime, in morning has fatigue, hard to get going, denies morning headache. As a child had sleep walking. At night he does kick, tends to move his arms in bed. Has had blood testing for fatigue, TSH which was reportedly ok. In past had sleep study, showed mild OSA, never used the mask.   Update August 05, 2022 SS: He had HST that showed mild sleep apnea with an AHI of around 5.4.  No CPAP was recommended.  Suggested avoiding supine sleep. Years long tremor, more in left hand than right, with action. Notices with fine motor skills, but not present all the time, none today. Sleeping is better, on trazodone. On Vimpat 150 mg BID, no seizures. Works full time for [employer], doing computer work. Takes hydrocodone for back pain. Admits to bad anxiety and depression.   REVIEW OF SYSTEMS: Out of a complete 14 system review of symptoms, the patient complains only of the following symptoms, and all other reviewed systems are negative.  See HPI  ALLERGIES: Allergies  Allergen Reactions   Elemental Sulfur Nausea Only    HOME MEDICATIONS: Outpatient Medications Prior to Visit  Medication Sig Dispense Refill   aspirin (ASPIRIN CHILDRENS) 81 MG chewable tablet Chew 1 tablet (81 mg total) by mouth daily.     atorvastatin (LIPITOR) 10 MG tablet TAKE 1 TABLET BY MOUTH EVERY DAY (Patient taking differently: Take 10 mg by mouth daily.) 90 tablet 0   HYDROcodone-acetaminophen (NORCO/VICODIN) 5-325 MG tablet Take 1 tablet by mouth daily.  0    irbesartan (AVAPRO) 300 MG tablet Take 300 mg by mouth daily.  6   Lacosamide 150 MG TABS TAKE 1 TABLET BY MOUTH TWICE A DAY 180 tablet 1   loratadine (CLARITIN) 10 MG tablet Take 10 mg by mouth daily.     meloxicam (MOBIC) 15 MG tablet TAKE 1 TABLET BY MOUTH EVERY DAY (Patient taking differently: Take 15 mg by mouth daily.) 30 tablet 0   pantoprazole (PROTONIX) 40 MG tablet Take 40 mg by mouth 2 (two) times daily.  4   sildenafil (VIAGRA) 100 MG tablet Take 1 tablet by mouth as needed.     traZODone (DESYREL) 100 MG tablet Take one tablet daily at bedtime. Please call [phone] to schedule an appt or request refills from PCP. (Patient taking differently: Take 100 mg by mouth at bedtime.) 90 tablet 0   metoprolol succinate (TOPROL-XL) 50 MG 24 hr tablet Take 1 tablet (50 mg total) by mouth daily. Take with or immediately following a meal. 90 tablet 3   No facility-administered medications prior to visit.    PAST MEDICAL HISTORY: Past Medical History:  Diagnosis Date   Allergic rhinitis    Allergy    Anxiety    Arthritis    Carpal tunnel syndrome    09-12-15 no longer a problem.   Colon polyps    Complication of anesthesia    one time awaken- not with last procedure done.   Depression    ED (erectile dysfunction)    GERD (gastroesophageal reflux disease)    HLD (hyperlipidemia)    Hypertension    Insomnia    Seizure ([place])    08-13-15 per pt his last seizure was "about 1 year ago".  maw   Sleep apnea    pt does not wear c-pap    PAST SURGICAL HISTORY: Past Surgical History:  Procedure Laterality Date   CHOLECYSTECTOMY  11/07/2015   COLONOSCOPY     EUS N/A 09/18/2015   Procedure: UPPER ENDOSCOPIC ULTRASOUND (EUS) LINEAR;  Surgeon: Milus Banister, MD;  Location: [facility];  Service: Endoscopy;  Laterality: N/A;   TONSILLECTOMY     TUMOR REMOVAL  11/07/2015   Cancerous tumor removed from duodenum.   UPPER GASTROINTESTINAL ENDOSCOPY     VIDEO BRONCHOSCOPY Bilateral 07/27/2018    Procedure: VIDEO BRONCHOSCOPY WITHOUT FLUORO;  Surgeon: Collene Gobble, MD;  Location[facility] ENDOSCOPY;  Service: Cardiopulmonary;  Laterality: Bilateral;   WISDOM TOOTH EXTRACTION      FAMILY HISTORY: Family History  Problem Relation Age of Onset   Breast cancer Mother        mets to liver   Stomach cancer Father    Epilepsy Sister    Cancer Maternal Grandmother        type unknown   Cancer Maternal Grandfather        type unknown   Cancer Paternal Grandfather        type unknown   Cancer Paternal Grandmother  type unknown   Colon cancer Neg Hx    Esophageal cancer Neg Hx    Rectal cancer Neg Hx    Prostate cancer Neg Hx     SOCIAL HISTORY: Social History   Socioeconomic History   Marital status: Married    Spouse name: Gailen Shelter   Number of children: 3   Years of education: college   Highest education level: Not on file  Occupational History   Occupation: semi-retired  Tobacco Use   Smoking status: Never   Smokeless tobacco: Never  Vaping Use   Vaping Use: Never used  Substance and Sexual Activity   Alcohol use: No    Alcohol/week: 0.0 standard drinks of alcohol    Comment: past -none in 7-8 yrs social only   Drug use: No   Sexual activity: Not on file  Other Topics Concern   Not on file  Social History Narrative   Patient lives at home with wife Neoma Laming.    Patient has retired and works part time at [employer]   Patient has 3 children.    Patient has a college education.   Patient is right handed.   Social Determinants of Health   Financial Resource Strain: Not on file  Food Insecurity: Not on file  Transportation Needs: Not on file  Physical Activity: Not on file  Stress: Not on file  Social Connections: Not on file  Intimate Partner Violence: Not on file   PHYSICAL EXAM  Vitals:   08/05/22 0832  BP: (!) 147/77  Pulse: (!) 59  Weight: 249 lb (112.9 kg)  Height: '5\' 11"'$  (1.803 m)   Body mass index is 34.73 kg/m.  Generalized: Well  developed, in no acute distress, tired looking Neurological examination  Mentation: Alert oriented to time, place, history taking. Follows all commands speech and language fluent Cranial nerve II-XII: Pupils were equal round reactive to light. Extraocular movements were full, visual field were full on confrontational test. Facial sensation and strength were normal. Head turning and shoulder shrug  were normal and symmetric. Motor: The motor testing reveals 5 over 5 strength of all 4 extremities. Good symmetric motor tone is noted throughout.  Sensory: Sensory testing is intact to soft touch on all 4 extremities. No evidence of extinction is noted.  Coordination: Cerebellar testing reveals good finger-nose-finger and heel-to-shin bilaterally. No tremor. Gait and station: Gait is normal.  Reflexes: Deep tendon reflexes are symmetric and normal bilaterally.   DIAGNOSTIC DATA (LABS, IMAGING, TESTING) - I reviewed patient records, labs, notes, testing and imaging myself where available.  Lab Results  Component Value Date   WBC 10.0 09/03/2020   HGB 14.5 09/03/2020   HCT 41.1 09/03/2020   MCV 87.6 09/03/2020   PLT 234 09/03/2020      Component Value Date/Time   NA 138 09/03/2020 0225   NA 134 05/02/2014 1139   K 4.1 09/03/2020 0225   CL 103 09/03/2020 0225   CO2 24 09/03/2020 0225   GLUCOSE 122 (H) 09/03/2020 0225   BUN 17 09/03/2020 0225   BUN 16 05/02/2014 1139   CREATININE 0.99 09/03/2020 0225   CALCIUM 9.0 09/03/2020 0225   PROT 6.3 (L) 09/03/2020 0225   PROT 7.3 08/23/2016 0836   ALBUMIN 3.5 09/03/2020 0225   ALBUMIN 4.4 08/23/2016 0836   AST 20 09/03/2020 0225   ALT 25 09/03/2020 0225   ALKPHOS 46 09/03/2020 0225   BILITOT 0.7 09/03/2020 0225   BILITOT 0.3 08/23/2016 0836   GFRNONAA >60  09/03/2020 0225   GFRAA >60 08/16/2018 0500   No results found for: "CHOL", "HDL", "LDLCALC", "LDLDIRECT", "TRIG", "CHOLHDL" Lab Results  Component Value Date   HGBA1C 6.6 (H)  09/02/2020   No results found for: "VITAMINB12" Lab Results  Component Value Date   TSH 0.877 09/03/2020      ASSESSMENT AND PLAN 72 y.o. year old male  has a past medical history of Allergic rhinitis, Allergy, Anxiety, Arthritis, Carpal tunnel syndrome, Colon polyps, Complication of anesthesia, Depression, ED (erectile dysfunction), GERD (gastroesophageal reflux disease), HLD (hyperlipidemia), Hypertension, Insomnia, Seizure ([place]), and Sleep apnea. here with:  1.  Epilepsy -Last seizure was in 2015 -Continue Vimpat 150 mg twice a day, refilled today  -PCP follow-up labs -Follow-up with me in 1 year or sooner if needed  2.  Chronic insomnia 3.  Daytime fatigue  -HST in April 2023 showed very mild sleep apnea, CPAP was not recommended -Recommend avoiding supine sleep, discuss with PCP about other factors contributing to fatigue, discusshis underlying depression and anxiety  Butler Denmark, AGNP-C, DNP 08/05/2022, 9:00 AM [facility] [address], [place], [place] [address] [phone]

## 2022-12-28 ENCOUNTER — Telehealth: Payer: Self-pay

## 2022-12-28 NOTE — Telephone Encounter (Signed)
Called the pharmacy and they stated that he does infact have one refill left. Please let patient know to contact pharmacy 1st regarding refills because sometimes refills were already sent. Check their bottle refill number and call pharmacy to request it.   Routing to phone room to make aware

## 2022-12-28 NOTE — Telephone Encounter (Signed)
Requested Prescriptions   Pending Prescriptions Disp Refills   Lacosamide 150 MG TABS 180 tablet 1    Sig: Take 1 tablet (150 mg total) by mouth 2 (two) times daily.   Last seen 08/05/22, next appt scheduled 07/28/23 Dispenses   Dispensed Days Supply Quantity Provider Pharmacy  LACOSAMIDE 150 MG TABLET 08/26/2022 90 180 each Glean Salvo, NP [facility] [phone] - G...  LACOSAMIDE 150 MG TABLET 05/27/2022 90 180 each Glean Salvo, NP [facility] [phone] - G...  LACOSAMIDE 150 MG TABLET 02/23/2022 90 180 each Glean Salvo, NP [facility] [phone] - G.Marland KitchenMarland Kitchen

## 2022-12-28 NOTE — Telephone Encounter (Signed)
He should have 1 refill left of VImpat. Written on 08/05/22, filled on 08/26/22, 0/1 refill showing of drug registry. If not, please let me know will send. thanks

## 2022-12-30 NOTE — Telephone Encounter (Signed)
Called pt, LVM informing him that he needs to contact pharmacy for refill/

## 2023-01-10 ENCOUNTER — Other Ambulatory Visit: Payer: Self-pay

## 2023-01-10 NOTE — Telephone Encounter (Signed)
Requested Prescriptions   Pending Prescriptions Disp Refills   Lacosamide 150 MG TABS 180 tablet 1    Sig: Take 1 tablet (150 mg total) by mouth 2 (two) times daily.   Last seen  08/05/22, next appt scheduled for 07/28/23 Dispenses   Dispensed Days Supply Quantity Provider Pharmacy  LACOSAMIDE 150 MG TABLET 12/29/2022 90 180 each Glean Salvo, NP [facility] [phone] - G...  LACOSAMIDE 150 MG TABLET 08/26/2022 90 180 each Glean Salvo, NP [facility] [phone] - G...  LACOSAMIDE 150 MG TABLET 05/27/2022 90 180 each Glean Salvo, NP [facility] [phone] - G...  LACOSAMIDE 150 MG TABLET 02/23/2022 90 180 each Glean Salvo, NP [facility] [phone] - G...    [facility]

## 2023-01-17 ENCOUNTER — Other Ambulatory Visit: Payer: Self-pay

## 2023-01-17 NOTE — Telephone Encounter (Signed)
Requested Prescriptions   Pending Prescriptions Disp Refills   Lacosamide 150 MG TABS 180 tablet 1    Sig: Take 1 tablet (150 mg total) by mouth 2 (two) times daily.   Last seen 08/05/22, next appt scheduled is 07/28/23 Dispenses   Dispensed Days Supply Quantity Provider Pharmacy  LACOSAMIDE 150 MG TABLET 12/29/2022 90 180 each Glean Salvo, NP [facility] [phone] - G...  LACOSAMIDE 150 MG TABLET 08/26/2022 90 180 each Glean Salvo, NP [facility] [phone] - G...  LACOSAMIDE 150 MG TABLET 05/27/2022 90 180 each Glean Salvo, NP [facility] [phone] - G...  LACOSAMIDE 150 MG TABLET 02/23/2022 90 180 each Glean Salvo, NP [facility] [phone] - G...       Refusing refill as he was dispensed a 90 day supply on 12/29/22

## 2023-02-10 ENCOUNTER — Encounter: Payer: Self-pay | Admitting: Internal Medicine

## 2023-03-25 ENCOUNTER — Ambulatory Visit: Payer: Managed Care, Other (non HMO)

## 2023-03-25 VITALS — Ht 71.0 in | Wt 250.0 lb

## 2023-03-25 DIAGNOSIS — Z86010 Personal history of colonic polyps: Secondary | ICD-10-CM

## 2023-03-25 MED ORDER — NA SULFATE-K SULFATE-MG SULF 17.5-3.13-1.6 GM/177ML PO SOLN
1.0000 | Freq: Once | ORAL | 0 refills | Status: AC
Start: 2023-03-25 — End: 2023-03-25

## 2023-03-25 NOTE — Progress Notes (Signed)

## 2023-03-31 ENCOUNTER — Telehealth: Payer: Self-pay | Admitting: Internal Medicine

## 2023-03-31 NOTE — Telephone Encounter (Signed)
Patient requested Prep instructions.

## 2023-04-04 ENCOUNTER — Encounter: Payer: Self-pay | Admitting: Internal Medicine

## 2023-04-05 ENCOUNTER — Other Ambulatory Visit: Payer: Self-pay | Admitting: Neurology

## 2023-04-05 NOTE — Telephone Encounter (Signed)
Requested Prescriptions   Pending Prescriptions Disp Refills   Lacosamide 150 MG TABS [Pharmacy Med Name: LACOSAMIDE 150 MG TABLET] 180 tablet     Sig: TAKE 1 TABLET BY MOUTH TWICE A DAY   Last seen 08/05/22, next appt 07/28/23 Dispenses  Routing to provider Dispensed Days Supply Quantity Provider Pharmacy  LACOSAMIDE 150 MG TABLET 12/29/2022 90 180 each Glean Salvo, NP [facility] [phone] - G...  LACOSAMIDE 150 MG TABLET 08/26/2022 90 180 each Glean Salvo, NP [facility] [phone] - G...  LACOSAMIDE 150 MG TABLET 05/27/2022 90 180 each Glean Salvo, NP [facility] [phone] - G.Marland KitchenMarland Kitchen

## 2023-04-12 ENCOUNTER — Ambulatory Visit (AMBULATORY_SURGERY_CENTER): Payer: Managed Care, Other (non HMO) | Admitting: Internal Medicine

## 2023-04-12 ENCOUNTER — Encounter: Payer: Self-pay | Admitting: Internal Medicine

## 2023-04-12 VITALS — BP 138/80 | HR 57 | Temp 99.0°F | Resp 13 | Ht 71.0 in | Wt 250.0 lb

## 2023-04-12 DIAGNOSIS — Z09 Encounter for follow-up examination after completed treatment for conditions other than malignant neoplasm: Secondary | ICD-10-CM

## 2023-04-12 DIAGNOSIS — Z860100 Personal history of colon polyps, unspecified: Secondary | ICD-10-CM

## 2023-04-12 MED ORDER — SODIUM CHLORIDE 0.9 % IV SOLN: 500.0000 mL | Freq: Once | INTRAVENOUS | Status: DC

## 2023-04-12 NOTE — Progress Notes (Signed)
Pt's states no medical or surgical changes since previsit or office visit. 

## 2023-04-12 NOTE — Patient Instructions (Signed)
Resume previous diet Continue present medications There were no colon polyps seen today!  You will need another screening colonoscopy in 5 years, you will receive a letter at that time when you are due for the procedure.   Please call us at [phone] if you have a change in bowel habits, change in family history of colo-rectal cancer, rectal bleeding or other GI concern before that time.  Handouts/information given for diverticulosis and hemorrhoids  YOU HAD AN ENDOSCOPIC PROCEDURE TODAY AT THE [facility]:   Refer to the procedure report that was given to you for any specific questions about what was found during the examination.  If the procedure report does not answer your questions, please call your gastroenterologist to clarify.  If you requested that your care partner not be given the details of your procedure findings, then the procedure report has been included in a sealed envelope for you to review at your convenience later.  YOU SHOULD EXPECT: Some feelings of bloating in the abdomen. Passage of more gas than usual.  Walking can help get rid of the air that was put into your GI tract during the procedure and reduce the bloating. If you had a lower endoscopy (such as a colonoscopy or flexible sigmoidoscopy) you may notice spotting of blood in your stool or on the toilet paper. If you underwent a bowel prep for your procedure, you may not have a normal bowel movement for a few days.  Please Note:  You might notice some irritation and congestion in your nose or some drainage.  This is from the oxygen used during your procedure.  There is no need for concern and it should clear up in a day or so.  SYMPTOMS TO REPORT IMMEDIATELY:  Following lower endoscopy (colonoscopy):  Excessive amounts of blood in the stool  Significant tenderness or worsening of abdominal pains  Swelling of the abdomen that is new, acute  Fever of 100F or higher  For urgent or emergent issues, a  gastroenterologist can be reached at any hour by calling [phone]. Do not use MyChart messaging for urgent concerns.    DIET:  We do recommend a small meal at first, but then you may proceed to your regular diet.  Drink plenty of fluids but you should avoid alcoholic beverages for 24 hours.  ACTIVITY:  You should plan to take it easy for the rest of today and you should NOT DRIVE or use heavy machinery until tomorrow (because of the sedation medicines used during the test).    FOLLOW UP: Our staff will call the number listed on your records the next business day following your procedure.  We will call around 7:15- 8:00 am to check on you and address any questions or concerns that you may have regarding the information given to you following your procedure. If we do not reach you, we will leave a message.     If any biopsies were taken you will be contacted by phone or by letter within the next 1-3 weeks.  Please call us at [phone] if you have not heard about the biopsies in 3 weeks.    SIGNATURES/CONFIDENTIALITY: You and/or your care partner have signed paperwork which will be entered into your electronic medical record.  These signatures attest to the fact that that the information above on your After Visit Summary has been reviewed and is understood.  Full responsibility of the confidentiality of this discharge information lies with you and/or your care-partner.

## 2023-04-12 NOTE — Progress Notes (Signed)
Sedate, gd SR, tolerated procedure well, VSS, report to RN 

## 2023-04-12 NOTE — Progress Notes (Signed)
HISTORY OF PRESENT ILLNESS:  Samuel Jacobs is a 71 y.o. male with a history of multiple adenomatous colon polyps.  Presents today for surveillance colonoscopy.  No complaints  REVIEW OF SYSTEMS:  All non-GI ROS negative except for  Past Medical History:  Diagnosis Date   Allergic rhinitis    Allergy    Anxiety    Arthritis    Carpal tunnel syndrome    09-12-15 no longer a problem.   Colon polyps    Complication of anesthesia    one time awaken- not with last procedure done.   Depression    ED (erectile dysfunction)    GERD (gastroesophageal reflux disease)    HLD (hyperlipidemia)    Hypertension    Insomnia    Seizure ([facility])    08-13-15 per pt his last seizure was "about 1 year ago".  maw   Sleep apnea    pt does not wear c-pap    Past Surgical History:  Procedure Laterality Date   CHOLECYSTECTOMY  11/07/2015   COLONOSCOPY     EUS N/A 09/18/2015   Procedure: UPPER ENDOSCOPIC ULTRASOUND (EUS) LINEAR;  Surgeon: Rachael Fee, MD;  Location: [facility];  Service: Endoscopy;  Laterality: N/A;   TONSILLECTOMY     TUMOR REMOVAL  11/07/2015   Cancerous tumor removed from duodenum.   UPPER GASTROINTESTINAL ENDOSCOPY     VIDEO BRONCHOSCOPY Bilateral 07/27/2018   Procedure: VIDEO BRONCHOSCOPY WITHOUT FLUORO;  Surgeon: Leslye Peer, MD;  Location: [facility] ENDOSCOPY;  Service: Cardiopulmonary;  Laterality: Bilateral;   WISDOM TOOTH EXTRACTION      Social History Samuel Jacobs  reports that he has never smoked. He has never used smokeless tobacco. He reports that he does not drink alcohol and does not use drugs.  family history includes Breast cancer in his mother; Cancer in his maternal grandfather, maternal grandmother, paternal grandfather, and paternal grandmother; Epilepsy in his sister; Stomach cancer in his father.  Allergies  Allergen Reactions   Elemental Sulfur Nausea Only       PHYSICAL EXAMINATION: Vital signs: BP (!) 166/82   Pulse 60   Temp 99 F (37.2 C) (Temporal)    Ht 5\' 11"  (1.803 m)   Wt 250 lb (113.4 kg)   SpO2 96%   BMI 34.87 kg/m  General: Well-developed, well-nourished, no acute distress HEENT: Sclerae are anicteric, conjunctiva pink. Oral mucosa intact Lungs: Clear Heart: Regular Abdomen: soft, nontender, nondistended, no obvious ascites, no peritoneal signs, normal bowel sounds. No organomegaly. Extremities: No edema Psychiatric: alert and oriented x3. Cooperative     ASSESSMENT:  Personal history of multiple adenomatous polyps   PLAN:  Surveillance colonoscopy

## 2023-04-12 NOTE — Op Note (Signed)
[facility] Patient Name: Samuel Jacobs Procedure Date: 04/12/2023 10:47 AM MRN: [id number] Endoscopist: Wilhemina Bonito. Marina Goodell , MD, [id number] Age: 72 Referring MD:  Date of Birth: [birth year] Gender: Male Account #: [id number] Procedure:                Colonoscopy Indications:              High risk colon cancer surveillance: Personal                            history of multiple (3 or more) adenomas Medicines:                Monitored Anesthesia Care Procedure:                Pre-Anesthesia Assessment:                           - Prior to the procedure, a History and Physical                            was performed, and patient medications and                            allergies were reviewed. The patient's tolerance of                            previous anesthesia was also reviewed. The risks                            and benefits of the procedure and the sedation                            options and risks were discussed with the patient.                            All questions were answered, and informed consent                            was obtained. Prior Anticoagulants: The patient has                            taken no anticoagulant or antiplatelet agents. ASA                            Grade Assessment: II - A patient with mild systemic                            disease. After reviewing the risks and benefits,                            the patient was deemed in satisfactory condition to                            undergo the procedure.  After obtaining informed consent, the colonoscope                            was passed under direct vision. Throughout the                            procedure, the patient's blood pressure, pulse, and                            oxygen saturations were monitored continuously. The                            CF [id number] #[id number] was introduced through the anus                            and advanced to the the  cecum, identified by                            appendiceal orifice and ileocecal valve. The                            ileocecal valve, appendiceal orifice, and rectum                            were photographed. The quality of the bowel                            preparation was good. The colonoscopy was performed                            without difficulty. The patient tolerated the                            procedure well. The bowel preparation used was                            SUPREP via split dose instruction. Scope In: 10:55:26 AM Scope Out: 11:06:42 AM Scope Withdrawal Time: 0 hours 9 minutes 27 seconds  Total Procedure Duration: 0 hours 11 minutes 16 seconds  Findings:                 Multiple diverticula were found in the transverse                            colon and left colon.                           Internal hemorrhoids were found during retroflexion.                           The exam was otherwise without abnormality on                            direct and retroflexion views. Complications:  No immediate complications. Estimated blood loss:                            None. Estimated Blood Loss:     Estimated blood loss: none. Impression:               - Diverticulosis in the transverse colon and in the                            left colon.                           - The examination was otherwise normal on direct                            and retroflexion views.                           - No specimens collected. Recommendation:           - Repeat colonoscopy in 5 years for surveillance,                            if medically fit and willing.                           - Patient has a contact number available for                            emergencies. The signs and symptoms of potential                            delayed complications were discussed with the                            patient. Return to normal activities tomorrow.                             Written discharge instructions were provided to the                            patient.                           - Resume previous diet.                           - Continue present medications. Wilhemina Bonito. Marina Goodell, MD 04/12/2023 11:15:31 AM This report has been signed electronically.

## 2023-04-13 ENCOUNTER — Telehealth: Payer: Self-pay | Admitting: *Deleted

## 2023-04-13 NOTE — Telephone Encounter (Signed)
No answer on  follow up call. Left message.   

## 2023-04-26 ENCOUNTER — Other Ambulatory Visit: Payer: Self-pay | Admitting: Neurology

## 2023-04-26 MED ORDER — LACOSAMIDE 150 MG PO TABS: 1.0000 | ORAL_TABLET | Freq: Two times a day (BID) | ORAL | 1 refills | Status: DC

## 2023-04-26 NOTE — Telephone Encounter (Signed)
Requested Prescriptions   Pending Prescriptions Disp Refills   Lacosamide 150 MG TABS 180 tablet 1    Sig: Take 1 tablet (150 mg total) by mouth 2 (two) times daily.   Last seen 08/05/22 07/28/23 next appt  Dispenses   Dispensed Days Supply Quantity Provider Pharmacy  LACOSAMIDE 150 MG TABLET 12/29/2022 90 180 each Glean Salvo, NP [facility] [phone] - [place]...  LACOSAMIDE 150 MG TABLET 08/26/2022 90 180 each Glean Salvo, NP [facility] [phone] - [place]...  LACOSAMIDE 150 MG TABLET 05/27/2022 90 180 each Glean Salvo, NP [facility] [phone] - [facility] KitchenMarland Kitchen

## 2023-04-26 NOTE — Telephone Encounter (Signed)
Pt called stating that his Lacosamide 150 MG TABS went up in price at the [facility] so he is wanting an RX sent in to the [facility] on [address] and would like it sent there from now on for his refills which is due now.

## 2023-07-28 ENCOUNTER — Ambulatory Visit: Payer: Medicare Other | Admitting: Neurology

## 2024-05-21 ENCOUNTER — Other Ambulatory Visit: Payer: Self-pay | Admitting: Neurology

## 2024-05-24 NOTE — Telephone Encounter (Signed)
 Last seen 08/05/22 next appt not scheduled this needs to be scheduled an appt

## 2024-05-28 NOTE — Telephone Encounter (Signed)
 Last seen 08/05/22 no next appt scheduled   Dispenses  No dispenses within 180 days of the adherence period (since 05/26/2023)   How do dispenses affect the score?    Outpatient Orders     The patient is taking this medication long-term.     Start Date End Date Dispense Refills Pharmacy   Lacosamide  150 MG TABS 04/26/2023 -- 180 tablet 1/1 [facility]...   Take 1 tablet (150 mg total) by mouth 2 (two) times daily.    Lacosamide  150 MG TABS 04/05/2023 04/26/2023 180 tablet 1/1 [id number] [place]   TAKE 1 TABLET BY MOUTH TWICE A DAY

## 2024-05-29 ENCOUNTER — Other Ambulatory Visit: Payer: Self-pay | Admitting: Neurology

## 2024-05-30 NOTE — Telephone Encounter (Signed)
 Last seen on 08/05/22 No 1 year follow up scheduled    Lacosamide  150 MG TABS 04/26/2023 -- 180 tablet 1/1 [facility] 09...   Take 1 tablet (150 mg total) by mouth 2 (two) times daily.    Lauraine pt does not have follow up scheduled. I sent my chart message to please call and schedule.
